# Patient Record
Sex: Female | Born: 1975 | Hispanic: Yes | Smoking: Never smoker
Health system: Southern US, Community
[De-identification: ages and names within clinical notes are randomized; demographics above are authoritative.]

## PROBLEM LIST (undated history)

## (undated) DIAGNOSIS — D259 Leiomyoma of uterus, unspecified: Secondary | ICD-10-CM

## (undated) DIAGNOSIS — Z5189 Encounter for other specified aftercare: Secondary | ICD-10-CM

## (undated) DIAGNOSIS — D649 Anemia, unspecified: Secondary | ICD-10-CM

## (undated) DIAGNOSIS — R519 Headache, unspecified: Secondary | ICD-10-CM

## (undated) DIAGNOSIS — R51 Headache: Secondary | ICD-10-CM

## (undated) DIAGNOSIS — N939 Abnormal uterine and vaginal bleeding, unspecified: Secondary | ICD-10-CM

## (undated) HISTORY — DX: Encounter for other specified aftercare: Z51.89

## (undated) HISTORY — PX: ABDOMINAL HYSTERECTOMY: SHX81

---

## 2001-03-30 ENCOUNTER — Ambulatory Visit (HOSPITAL_COMMUNITY): Admission: RE | Admit: 2001-03-30 | Discharge: 2001-03-30 | Payer: Self-pay | Admitting: *Deleted

## 2001-08-10 ENCOUNTER — Inpatient Hospital Stay (HOSPITAL_COMMUNITY): Admission: AD | Admit: 2001-08-10 | Discharge: 2001-08-10 | Payer: Self-pay | Admitting: Obstetrics & Gynecology

## 2001-08-31 ENCOUNTER — Encounter (HOSPITAL_COMMUNITY): Admission: RE | Admit: 2001-08-31 | Discharge: 2001-09-05 | Payer: Self-pay | Admitting: Obstetrics & Gynecology

## 2001-09-02 ENCOUNTER — Inpatient Hospital Stay (HOSPITAL_COMMUNITY): Admission: AD | Admit: 2001-09-02 | Discharge: 2001-09-04 | Payer: Self-pay | Admitting: *Deleted

## 2012-07-04 ENCOUNTER — Ambulatory Visit
Admission: RE | Admit: 2012-07-04 | Discharge: 2012-07-04 | Disposition: A | Discharge status: Home / Self Care | Payer: Self-pay | Source: Ambulatory Visit | Attending: Family Medicine | Admitting: Family Medicine

## 2012-07-04 ENCOUNTER — Other Ambulatory Visit: Payer: Self-pay | Admitting: Family Medicine

## 2012-07-04 ENCOUNTER — Ambulatory Visit: Payer: Self-pay | Admitting: Family Medicine

## 2012-07-04 VITALS — BP 132/84 | HR 72 | Temp 97.7°F | Resp 16 | Ht 61.5 in | Wt 186.0 lb

## 2012-07-04 DIAGNOSIS — N644 Mastodynia: Secondary | ICD-10-CM

## 2012-07-04 DIAGNOSIS — I10 Essential (primary) hypertension: Secondary | ICD-10-CM

## 2012-07-04 DIAGNOSIS — N63 Unspecified lump in unspecified breast: Secondary | ICD-10-CM

## 2012-07-04 DIAGNOSIS — J029 Acute pharyngitis, unspecified: Secondary | ICD-10-CM

## 2012-07-04 DIAGNOSIS — R590 Localized enlarged lymph nodes: Secondary | ICD-10-CM

## 2012-07-04 LAB — POCT URINE PREGNANCY: Preg Test, Ur: NEGATIVE

## 2012-07-04 LAB — POCT URINALYSIS DIPSTICK
Bilirubin, UA: NEGATIVE
Glucose, UA: NEGATIVE
Leukocytes, UA: NEGATIVE
Nitrite, UA: NEGATIVE
pH, UA: 6.5

## 2012-07-04 NOTE — Patient Instructions (Addendum)
1. Mass of breast  MM Digital Diagnostic Unilat L, MM Digital Diagnostic Bilat, US Breast Left  2. Breast pain  MM Digital Diagnostic Unilat L, POCT urinalysis dipstick, POCT urine pregnancy, MM Digital Diagnostic Bilat, US Breast Left  3. Hypertension  POCT urinalysis dipstick  4. Sore throat      YOU HAVE AN APPOINTMENT FOR A MAMMOGRAM AND BREAST ULTRASOUND TODAY AT 1:30 PM.    Driving directions to [facility] 3D2D  [phone]  - more info    [address]  [place], [place] [address]     1. Head south on [address] toward [address]      0.5 mi    2. Sharp left onto [address]      0.6 mi    3. Turn left onto the [employer] E ramp      0.2 mi    4. Merge onto [address] [address] E      3.0 mi    5. Continue straight to stay on [employer] [address] W E      0.4 mi    6. Slight left to stay on [employer] [address] W E      1.2 mi    7. Turn right onto [address]      0.1 mi    8. Turn left onto [employer]      361 ft    9. Take the 1st left onto [facility]  Destination will be on the right    Driving directions to [facility] 3D2D  [phone]  - more info    [address]  [place], [place] [address]     1. Head north on [address] toward [employer]      344 ft    2. Turn right onto [employer]      0.3 mi    3. Slight left to stay on [address]      1.7 mi    4. Turn left onto [employer]  Destination will be on the right     0.6 mi     [facility]  [address] [place]   Driving directions to [address] [place], [place], [place] [address] 3D2D  - more info    [address]  [place], [place] [address]     1. Head south on [address] toward [address]      0.5 mi    2. Sharp left onto [address]      0.6 mi    3. Turn left onto the [employer] E ramp      0.2 mi    4. Merge onto [employer] [address] E      3.0 mi    5. Continue straight to stay on [employer] W E      0.4 mi    6. Slight left to stay on [facility]  Destination  will be on the right     1.0 mi     [address] [place], [place] [address]   Driving directions to [facility] 3D2D  [phone]  - more info    [address]  [place], [place] [address]     1. Head south on [address] toward [address]      0.5 mi    2. Sharp left onto [address]  0.6 mi    3. Turn left onto the [employer] E ramp      0.2 mi    4. Merge onto [employer] [address] E      3.0 mi    5. Slight right toward [employer] (signs for [address])      0.2 mi    6. Take the ramp to [employer]      338 ft    7. Turn left onto [employer] [address]      0.3 mi    8. Turn left onto [address]  Destination will be on the right     0.2 mi     [facility]  [address]   Driving directions to [facility] 3D2D  - more info    [address]  [place], [place] [address]     1. Head south on [address] toward [address]      0.7 mi    2. Turn left onto [employer]      0.4 mi    3. Take the 3rd right onto [facility] W W      1.1 mi    4. Take the [address] ramp to [facility]      0.2 mi    5. Merge onto [address]      3.7 mi    6. Take exit [address] for [address] toward [place] [employer]      0.3 mi    7. Keep left at the fork, follow signs for Airport      381 ft    8. Keep left at the fork, follow signs for [facility] [address]      302 ft    9. Turn left onto [address]      2.6 mi    10. Turn right onto [employer] will be on the left     0.2 mi     [facility]

## 2012-07-04 NOTE — Progress Notes (Signed)
Subjective:    Patient ID: Amy Aguilar, female    DOB: 03/12/1976, 36 y.o.   MRN: [id number]  HPIThis 36 y.o. female presents for evaluation of the following:  1.  L breast pain with lump:  Pain started two days ago but noticed lump yesterday.  No breast feeding.  No pregnancy; LMP 2 weeks ago.  June 2013 went to gynecology; diagnosed with tumor and needs hysterectomy but has not scheduled.  +Five children; does not desire further child-bearing..  No breast cancer but mother died young at age 20; died of tuberculosis; pt has been tested for tuberculosis and negative.  Youngest child age 78yo twins. No contraception; +condoms.  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  Last sexual encounter one month ago.   2.  Mild sore throat:  No fever/+chills/no sweats.  No headache. Ear pain no; +decreased hearing L ear.  Mild rhinorrhea; mild nasal congestion.  No cough.  No n/v/d; no abdominal pain.  No sick.  No working.  3.  BP check:  History of HTN; was taking medication; one month ago, finished medication.  Prescribed by [facility].  Blood pressure at [facility].  Metoprolol previously prescribed.  Took medication for 8 months.  Has lost 8 pounds in past several months.  Started exercising regularly.  Denies chest pain, palpitations, shortness of breath, leg swelling, headaches, dizziness.   Review of Systems  Constitutional: Positive for chills. Negative for fever, diaphoresis and fatigue.  HENT: Positive for congestion and rhinorrhea. Negative for ear pain, sore throat, trouble swallowing, neck pain, neck stiffness, dental problem, voice change and sinus pressure.   Eyes: Negative for photophobia and visual disturbance.  Respiratory: Negative for cough, choking and shortness of breath.   Gastrointestinal: Negative for nausea, vomiting, abdominal pain and diarrhea.  Genitourinary:       +BREAST PAIN AND LUMP L.  Skin: Negative for rash.    Neurological: Negative for dizziness, syncope, weakness, light-headedness, numbness and headaches.  Hematological: Positive for adenopathy.    Past Medical History  Diagnosis Date  . Hypertension     No past surgical history on file.  Prior to Admission medications   Not on File    No Known Allergies  History   Social History  . Marital Status: Married    Spouse Name: N/A    Number of Children: N/A  . Years of Education: N/A   Occupational History  . Not on file.   Social History Main Topics  . Smoking status: Never Smoker   . Smokeless tobacco: Not on file  . Alcohol Use: No  . Drug Use: No  . Sexually Active: Yes   Other Topics Concern  . Not on file   Social History Narrative   Marital status: married x 17 years; happily married.  From [place]; moved to [place] 2000.Children: 5 children; no grandchildren. Living: husband, 5 children.Employment: homemaker.    Family History  Problem Relation Age of Onset  . Hypertension Father       Objective:   Physical Exam  Nursing note and vitals reviewed. Constitutional: She is oriented to person, place, and time. She appears well-developed and well-nourished. No distress.  HENT:  Head: Normocephalic and atraumatic.  Right Ear: External ear normal.  Left Ear: External ear normal.  Nose: Nose normal.  Mouth/Throat: Oropharynx is clear and moist and mucous membranes are normal. No oral lesions. No oropharyngeal exudate, posterior oropharyngeal edema, posterior oropharyngeal erythema or tonsillar abscesses.  Eyes: Conjunctivae are normal. Pupils are equal, round, and reactive to light.  Neck: Normal range of motion. Neck supple. No thyromegaly present.       Mild L anterior cervical LAD.  B hypertrophied tonsils without erythema, exudate.  No uvula deviation.  Cardiovascular: Normal rate, regular rhythm, normal heart sounds and intact distal pulses.   Pulmonary/Chest: Effort normal and breath sounds normal. No respiratory  distress.  Genitourinary: There is breast tenderness. No breast swelling, discharge or bleeding.       R BREAST: NORMAL EXAM. NO DISCHARGE FROM NIPPLE. L BREAST:  +3CM X 2 CM MASS MOBILE AT 11 O'CLOCK POSITION; +MILDY TTP WITHOUT ERYTHEMA ALONG SKIN; NO DRAINAGE EXPRESSED FROM NIPPLE.  NO AXILLARY LAD.  Lymphadenopathy:    She has cervical adenopathy.  Neurological: She is alert and oriented to person, place, and time. No cranial nerve deficit. She exhibits normal muscle tone.  Skin: Skin is warm and dry. No rash noted. She is not diaphoretic. No erythema.  Psychiatric: She has a normal mood and affect. Her behavior is normal. Judgment and thought content normal.     Results for orders placed in visit on 07/04/12  POCT URINALYSIS DIPSTICK      Component Value Range   Color, UA yellow     Clarity, UA clear     Glucose, UA neg     Bilirubin, UA neg     Ketones, UA neg     Spec Grav, UA 1.015     Blood, UA trace     pH, UA 6.5     Protein, UA neg     Urobilinogen, UA 0.2     Nitrite, UA neg     Leukocytes, UA Negative    POCT URINE PREGNANCY      Component Value Range   Preg Test, Ur Negative          Assessment & Plan:   1. Mass of breast  MM Digital Diagnostic Unilat L, MM Digital Diagnostic Bilat, US Breast Left  2. Breast pain  MM Digital Diagnostic Unilat L, POCT urinalysis dipstick, POCT urine pregnancy, MM Digital Diagnostic Bilat, US Breast Left  3. Hypertension  POCT urinalysis dipstick  4. Sore throat      1.  L breast pain/mass:  New.  Send for diagnostic L mammogram and L breast ultrasound.  Urine pregnancy negative.   2.  Hypertension: improved with exercise, weight loss; does not warrant medication at this time; encourage further weight loss and exercise. 3.  Cervical LAD: New. L sided; mild.  Supportive care with rest, Ibuprofen or Tylenol.  Contact # for patient:  [phone]

## 2012-07-05 NOTE — Progress Notes (Signed)
Reviewed and agree.

## 2012-07-07 ENCOUNTER — Other Ambulatory Visit: Payer: Self-pay | Admitting: Family Medicine

## 2012-07-07 DIAGNOSIS — N63 Unspecified lump in unspecified breast: Secondary | ICD-10-CM

## 2012-07-08 ENCOUNTER — Telehealth: Payer: Self-pay

## 2012-07-08 NOTE — Telephone Encounter (Signed)
Patient called requesting if lab results were available for biopsy done last week. Patient may need a translator in [place]. Thank you!

## 2012-07-08 NOTE — Telephone Encounter (Signed)
I'm not seeing that we have any results in EPIC but I didn't know if you remember getting a hard copy of it or if we need to call for it on Monday.

## 2012-07-08 NOTE — Telephone Encounter (Signed)
Patient wanted to know about lab results from last week about biopsy done. May need spanish translator. Thank you!

## 2012-07-09 NOTE — Telephone Encounter (Signed)
We recently advised patient of abnormal mammogram and breast u/s results; we recommended referral for biopsy.  I doubt that biopsy was completed unless it was coordinated by radiology facility where she had mammogram completed.  Can we please clarify with patient (will likely need Spanish speaking staff call patient) if she had biopsy performed yet or if she is still waiting on referral?  KMS

## 2012-07-10 NOTE — Telephone Encounter (Signed)
Called and spoke to patients husband and he is aware of

## 2012-07-10 NOTE — Telephone Encounter (Signed)
The biopsy scheduled for 07/12/2012 at 01:00 p.m.  have left message for them to call me back about this.

## 2012-07-10 NOTE — Telephone Encounter (Signed)
Dala Dock spoke w/pt in Spanish to find out what pt's ques was and make sure she knows about her biopsy appt on 9/18. Pt was aware of appt, but just wanted to know what was seen on [place] that is requiring her to go for biopsy. Dala Dock explained that they had just seen an area that did not look normal, but they have to do biopsy to determine what it is. Pt verbalized understanding to [place].

## 2012-07-10 NOTE — Telephone Encounter (Signed)
Husband advised he is aware of the appt, I am not sure what they need. I will call the other number or have someone who speaks spanish call.

## 2012-07-12 ENCOUNTER — Other Ambulatory Visit: Payer: Self-pay | Admitting: Family Medicine

## 2012-07-12 ENCOUNTER — Ambulatory Visit
Admission: RE | Admit: 2012-07-12 | Discharge: 2012-07-12 | Disposition: A | Discharge status: Home / Self Care | Payer: Self-pay | Source: Ambulatory Visit | Attending: Family Medicine | Admitting: Family Medicine

## 2012-07-12 DIAGNOSIS — N644 Mastodynia: Secondary | ICD-10-CM

## 2012-07-12 DIAGNOSIS — N63 Unspecified lump in unspecified breast: Secondary | ICD-10-CM

## 2012-07-20 ENCOUNTER — Other Ambulatory Visit: Payer: Self-pay | Admitting: Family Medicine

## 2012-07-20 DIAGNOSIS — N6009 Solitary cyst of unspecified breast: Secondary | ICD-10-CM

## 2012-07-21 ENCOUNTER — Ambulatory Visit (INDEPENDENT_AMBULATORY_CARE_PROVIDER_SITE_OTHER): Payer: Self-pay | Admitting: General Surgery

## 2012-08-08 ENCOUNTER — Ambulatory Visit: Payer: Self-pay | Admitting: Physician Assistant

## 2012-08-08 VITALS — BP 110/68 | HR 71 | Temp 98.0°F | Resp 16 | Ht 61.5 in | Wt 183.0 lb

## 2012-08-08 DIAGNOSIS — N926 Irregular menstruation, unspecified: Secondary | ICD-10-CM

## 2012-08-08 LAB — POCT CBC
Hemoglobin: 12.4 g/dL (ref 12.2–16.2)
MPV: 11 fL (ref 0–99.8)
POC Granulocyte: 3.6 (ref 2–6.9)
POC MID %: 4.4 %M (ref 0–12)
RBC: 4.7 M/uL (ref 4.04–5.48)

## 2012-08-08 LAB — POCT URINE PREGNANCY: Preg Test, Ur: NEGATIVE

## 2012-08-08 MED ORDER — NORGESTIMATE-ETH ESTRADIOL 0.25-35 MG-MCG PO TABS: 1.0000 | ORAL_TABLET | Freq: Every day | ORAL | Status: DC

## 2012-08-08 NOTE — Progress Notes (Signed)
[address], [place] [address]   Phone [phone]  Subjective:    Patient ID: Amy Aguilar, female    DOB: [birth year], 36 y.o.   MRN: [id number]  HPI Pt presents to clinic with menses since 9/27.  Heavy at times with clots.  She is only sexually active with her husband who works out of town, last sex was 1 month ago with condom.  She has had similar long menses in Dec and June.  In June she had a [place] and they told her she had fibroids that might be located in her cervix but she cannot remember exactly and does not have a copy of the results but they recommended a Hysterectomy and she wants someone elses opinion.  She has never had any STD testing done and is not concerned.  She is overall fatigued.  She has twin 35 y/o boyus.   Review of Systems  Constitutional: Positive for fatigue. Negative for fever and chills.  Genitourinary: Positive for vaginal bleeding and menstrual problem. Negative for dysuria and dyspareunia.       Objective:   Physical Exam  Vitals reviewed. Constitutional: She is oriented to person, place, and time. She appears well-developed and well-nourished.  HENT:  Head: Normocephalic and atraumatic.  Right Ear: External ear normal.  Left Ear: External ear normal.  Pulmonary/Chest: Effort normal.  Abdominal: Soft. Bowel sounds are normal. There is no tenderness.  Genitourinary: Uterus normal. Pelvic exam was performed with patient supine. There is no rash, tenderness, lesion or injury on the right labia. There is no rash, tenderness, lesion or injury on the left labia. Cervix exhibits discharge (blod from os) and friability. Cervix exhibits no motion tenderness. Right adnexum displays no mass, no tenderness and no fullness. Left adnexum displays no mass, no tenderness and no fullness. There is bleeding (blood and clots) around the vagina. No tenderness around the vagina.       Unable to feel fibroids.  Neurological: She is alert and oriented to  person, place, and time.  Skin: Skin is warm and dry.  Psychiatric: She has a normal mood and affect. Her behavior is normal. Judgment and thought content normal.    Results for orders placed in visit on 08/08/12  POCT URINE PREGNANCY      Component Value Range   Preg Test, Ur Negative    POCT CBC      Component Value Range   WBC 6.2  4.6 - 10.2 K/uL   Lymph, poc 2.3  0.6 - 3.4   POC LYMPH PERCENT 37.0  10 - 50 %L   MID (cbc) 0.3  0 - 0.9   POC MID % 4.4  0 - 12 %M   POC Granulocyte 3.6  2 - 6.9   Granulocyte percent 58.6  37 - 80 %G   RBC 4.70  4.04 - 5.48 M/uL   Hemoglobin 12.4  12.2 - 16.2 g/dL   HCT, POC 52.8  41.3 - 47.9 %   MCV 88.3  80 - 97 fL   MCH, POC 26.4 (*) 27 - 31.2 pg   MCHC 29.9 (*) 31.8 - 35.4 g/dL   RDW, POC 24.4     Platelet Count, POC 253  142 - 424 K/uL   MPV 11.0  0 - 99.8 fL         Assessment & Plan:   1. Menses, irregular  POCT urine pregnancy, GC/chlamydia probe amp, genital, POCT CBC, norgestimate-ethinyl estradiol (ORTHO-CYCLEN,SPRINTEC,PREVIFEM) 0.25-35 MG-MCG tablet  1- pt is going to try and get a copy of her [place] from June so we can f/u id necessary.  We discussed options for her irregular menses.  It is possible she has fibroids and they are causing her irregular menses even though I cannot palpate them today.  We are waiting on STD testing but think that is a low possibility to cause her irregular bleeding.  We will start OCP today and see her results over the next 6 months because she does not wish to get pregnant and she is not excited about HSYT option  The OCP should stop her current bleeding and control her menses in the future.  If she does not get relief from irregular menses then we will do a referral to Dr. Lily Peer for eval and repeat US.  D/w pt the SE of OCP and how to use them.

## 2012-08-09 LAB — GC/CHLAMYDIA PROBE AMP, GENITAL: Chlamydia, DNA Probe: NEGATIVE

## 2012-08-11 ENCOUNTER — Other Ambulatory Visit: Payer: Self-pay | Admitting: Family Medicine

## 2012-08-11 DIAGNOSIS — N63 Unspecified lump in unspecified breast: Secondary | ICD-10-CM

## 2012-08-30 ENCOUNTER — Inpatient Hospital Stay (HOSPITAL_COMMUNITY)
Admission: AD | Admit: 2012-08-30 | Discharge: 2012-08-31 | Disposition: A | Discharge status: Home / Self Care | Payer: Self-pay | Source: Ambulatory Visit | Attending: Obstetrics and Gynecology | Admitting: Obstetrics and Gynecology

## 2012-08-30 ENCOUNTER — Encounter (HOSPITAL_COMMUNITY): Payer: Self-pay | Admitting: *Deleted

## 2012-08-30 DIAGNOSIS — D259 Leiomyoma of uterus, unspecified: Secondary | ICD-10-CM | POA: Insufficient documentation

## 2012-08-30 DIAGNOSIS — N938 Other specified abnormal uterine and vaginal bleeding: Secondary | ICD-10-CM

## 2012-08-30 DIAGNOSIS — N949 Unspecified condition associated with female genital organs and menstrual cycle: Secondary | ICD-10-CM

## 2012-08-30 DIAGNOSIS — I10 Essential (primary) hypertension: Secondary | ICD-10-CM | POA: Insufficient documentation

## 2012-08-30 NOTE — MAU Provider Note (Signed)
CC: Vaginal Bleeding and Abdominal Pain    First Provider Initiated Contact with Patient 08/30/12 2325    Interpreter present for visit.  HPI Amy Aguilar ia a 36 y.o. [id number]  who presents with heavy vaginal bleeding since 10/27 that became heavier today, requiring pad change q 30 min. She is on Sprintec continuous for control of bleeding but missed today's pill. She has cramping and feels weak but not dizzy. She had regular menses until December 2012. She had long heavy bleeds in both December and June. She states that she had an ultrasound done in [place] and was told she had fibroids and they recommended a hysterectomy. She was seen at [facility] 08/08/2012 for an episode of bleeding for 9 days. At that time hemoglobin was 12.4. They did GC and Chlamydia which were both negative. She was started on Sprintec one a day which she has been taking until today.    Past Medical History  Diagnosis Date  . Hypertension     OB History    Grav Para Term Preterm Abortions TAB SAB Ect Mult Living   4 4 4      1 5      # Outc Date GA Lbr Len/2nd Wgt Sex Del Anes PTL Lv   1A TRM         Yes   1B          Yes   2 TRM         Yes   3 TRM         Yes   4 TRM         Yes      No past surgical history on file.  History   Social History  . Marital Status: Married    Spouse Name: N/A    Number of Children: N/A  . Years of Education: N/A   Occupational History  . Not on file.   Social History Main Topics  . Smoking status: Never Smoker   . Smokeless tobacco: Not on file  . Alcohol Use: No  . Drug Use: No  . Sexually Active: Yes   Other Topics Concern  . Not on file   Social History Narrative   Marital status: married x 17 years; happily married.  From [place]; moved to [place] 2000.Children: 5 children; no grandchildren. Living: husband, 5 children.Employment: homemaker.    No current facility-administered medications on file prior to encounter.   Current  Outpatient Prescriptions on File Prior to Encounter  Medication Sig Dispense Refill  . norgestimate-ethinyl estradiol (ORTHO-CYCLEN,SPRINTEC,PREVIFEM) 0.25-35 MG-MCG tablet Take 1 tablet by mouth daily.  1 Package  5    No Known Allergies  ROS Pertinent items in HPI  Filed Vitals:   08/30/12 2227  BP: 166/96  Pulse: 93  Temp: 98.2 F (36.8 C)  Resp: 18   PHYSICAL EXAM General: Well nourished, well developed female in no acute distress Cardiovascular: Normal rate Respiratory: Normal effort Abdomen: Soft, nontender Back: No CVAT Extremities: No edema Neurologic: Alert and oriented  Speculum exam: NEFG; vagina with large amount  Blood with clots; cervix clean Bimanual exam: cervix closed, no CMT; uterus NSSP; no adnexal tenderness or masses  LAB RESULTS Results for orders placed during the hospital encounter of 08/30/12 (from the past 24 hour(s))  CBC     Status: Abnormal   Collection Time   08/30/12 11:45 PM      Component Value Range   WBC 7.9  4.0 - 10.5  K/uL   RBC 3.78 (*) 3.87 - 5.11 MIL/uL   Hemoglobin 10.1 (*) 12.0 - 15.0 g/dL   HCT 16.1 (*) 09.6 - 04.5 %   MCV 83.6  78.0 - 100.0 fL   MCH 26.7  26.0 - 34.0 pg   MCHC 32.0  30.0 - 36.0 g/dL   RDW 40.9  81.1 - 91.4 %   Platelets 239  150 - 400 K/uL    IMAGING No results found.  MAU COURSE  ASSESSMENT 1. DUB (dysfunctional uterine bleeding)   Hx Fibroids Hypertension  PLAN D/W Dr. Emelda Fear Discharge home. See AVS for patient education F/U with [place] and GYN appt at [facility] Take iron supplement 1 po bid   Medication List     As of 08/31/2012  3:26 AM    STOP taking these medications         norgestimate-ethinyl estradiol 0.25-35 MG-MCG tablet   Commonly known as: ORTHO-CYCLEN,SPRINTEC,PREVIFEM      TAKE these medications         megestrol 40 MG tablet   Commonly known as: MEGACE   Take 1 tablet (40 mg total) by mouth daily.          Danae Orleans, CNM 08/30/2012 11:48 PM

## 2012-08-30 NOTE — MAU Note (Signed)
"  i have been bleeding a lot", states bleeding x 9 days, yesterday it became heavier. Today changing pad q 30  Minutes. Lower back , lower abd pain x 24 hours.  Had bleeding 9/30 thru 10/15, saw MD and started BCP's and this bleeding stopped but only for one week, then started bleeding again on 10/27

## 2012-08-31 LAB — CBC
HCT: 31.6 % — ABNORMAL LOW (ref 36.0–46.0)
Hemoglobin: 10.1 g/dL — ABNORMAL LOW (ref 12.0–15.0)
MCH: 26.7 pg (ref 26.0–34.0)
MCHC: 32 g/dL (ref 30.0–36.0)
MCV: 83.6 fL (ref 78.0–100.0)
Platelets: 239 10*3/uL (ref 150–400)
RBC: 3.78 MIL/uL — ABNORMAL LOW (ref 3.87–5.11)
RDW: 13.1 % (ref 11.5–15.5)
WBC: 7.9 10*3/uL (ref 4.0–10.5)

## 2012-08-31 MED ORDER — MEGESTROL ACETATE 40 MG PO TABS: 40.0000 mg | ORAL_TABLET | Freq: Every day | ORAL | Status: DC

## 2012-09-02 NOTE — MAU Provider Note (Signed)
Attestation of Attending Supervision of Advanced Practitioner: Evaluation and management procedures were performed by the PA/NP/CNM/OB Fellow under my supervision/collaboration. Chart reviewed and agree with management and plan.  Amy Aguilar V 09/02/2012 8:07 AM

## 2012-09-04 ENCOUNTER — Telehealth: Payer: Self-pay

## 2012-09-04 NOTE — Telephone Encounter (Signed)
Message copied by Faythe Casa on Mon Sep 04, 2012 12:59 PM ------      Message from: Malena Catholic      Created: Fri Sep 01, 2012 12:44 PM      Regarding: Needs [place] appt per [facility] appt has been made for Monday, November 25 at 2:15pm            Please schedule [place] and contact patient      ----- Message -----         From: Danae Orleans, CNM         Sent: 08/31/2012  12:47 AM           To: [facility] Admin Pool            F/U DUB. Givenn Megace. Please schedule pelvic US and appt.

## 2012-09-06 ENCOUNTER — Telehealth: Payer: Self-pay | Admitting: General Practice

## 2012-09-06 DIAGNOSIS — N938 Other specified abnormal uterine and vaginal bleeding: Secondary | ICD-10-CM

## 2012-09-06 NOTE — Telephone Encounter (Signed)
Message copied by Kathee Delton on Wed Sep 06, 2012 12:02 PM ------      Message from: Malena Catholic      Created: Fri Sep 01, 2012 12:44 PM      Regarding: Needs [place] appt per [facility] appt has been made for Monday, November 25 at 2:15pm            Please schedule [place] and contact patient      ----- Message -----         From: Danae Orleans, CNM         Sent: 08/31/2012  12:47 AM           To: [facility] Admin Pool            F/U DUB. Givenn Megace. Please schedule pelvic US and appt.

## 2012-09-06 NOTE — Telephone Encounter (Signed)
Called patient with Amy Aguilar for interpreter and notified patient of clinic appt on 11/25 at 2:15 and of her ultrasound appt on 11/19 at 3:15 and to come with a full bladder. Patient voiced understanding and had no further questions.

## 2012-09-12 ENCOUNTER — Ambulatory Visit (HOSPITAL_COMMUNITY)
Admission: RE | Admit: 2012-09-12 | Discharge: 2012-09-12 | Disposition: A | Discharge status: Home / Self Care | Payer: Self-pay | Source: Ambulatory Visit | Attending: Obstetrics & Gynecology | Admitting: Obstetrics & Gynecology

## 2012-09-12 DIAGNOSIS — N949 Unspecified condition associated with female genital organs and menstrual cycle: Secondary | ICD-10-CM | POA: Insufficient documentation

## 2012-09-12 DIAGNOSIS — N938 Other specified abnormal uterine and vaginal bleeding: Secondary | ICD-10-CM | POA: Insufficient documentation

## 2012-09-12 DIAGNOSIS — D251 Intramural leiomyoma of uterus: Secondary | ICD-10-CM | POA: Insufficient documentation

## 2012-09-18 ENCOUNTER — Encounter: Payer: Self-pay | Admitting: Obstetrics and Gynecology

## 2012-09-18 ENCOUNTER — Other Ambulatory Visit (HOSPITAL_COMMUNITY)
Admission: RE | Admit: 2012-09-18 | Discharge: 2012-09-18 | Disposition: A | Discharge status: Home / Self Care | Payer: Self-pay | Source: Ambulatory Visit | Attending: Obstetrics and Gynecology | Admitting: Obstetrics and Gynecology

## 2012-09-18 ENCOUNTER — Ambulatory Visit (INDEPENDENT_AMBULATORY_CARE_PROVIDER_SITE_OTHER): Payer: Self-pay | Admitting: Obstetrics and Gynecology

## 2012-09-18 VITALS — BP 122/81 | HR 91 | Temp 97.6°F | Ht 64.0 in | Wt 183.0 lb

## 2012-09-18 DIAGNOSIS — N920 Excessive and frequent menstruation with regular cycle: Secondary | ICD-10-CM | POA: Insufficient documentation

## 2012-09-18 DIAGNOSIS — Z01812 Encounter for preprocedural laboratory examination: Secondary | ICD-10-CM

## 2012-09-18 NOTE — Progress Notes (Signed)
  Subjective:    Patient ID: Amy Aguilar, female    DOB: 1976-09-06, 36 y.o.   MRN: [id number]  HPI  36 yo G4P4005 presenting today as an MAU follow-up for evaluation of abnormal uterine bleeding. Patient reports irregular and heavy bleeding since last December. Her periods can last up to 3 weeks. She was started on Spintec in September but without any effect. Patient was then prescribed Megace which has stopped her bleeding. Patient was seen by a gynecologist in [place] who informed her of fibroid uterus.  Past Medical History  Diagnosis Date  . Hypertension    History reviewed. No pertinent past surgical history. Family History  Problem Relation Age of Onset  . Hypertension Father     History  Substance Use Topics  . Smoking status: Never Smoker   . Smokeless tobacco: Never Used  . Alcohol Use: No     Review of Systems  All other systems reviewed and are negative.       Objective:   Physical Exam  GENERAL: Well-developed, well-nourished female in no acute distress.  HEENT: Normocephalic, atraumatic. Sclerae anicteric.  NECK: Supple. Normal thyroid.  LUNGS: Clear to auscultation bilaterally.  HEART: Regular rate and rhythm. ABDOMEN: Soft, nontender, nondistended. No organomegaly. PELVIC: Normal external female genitalia. Vagina is pink and rugated.  Normal discharge. Normal appearing cervix. Uterus is normal in size. No adnexal mass or tenderness. EXTREMITIES: No cyanosis, clubbing, or edema, 2+ distal pulses.   08/2012 ultrasound:  IMPRESSION:  1. Uterine fibroids, with largest in the lower uterine segment  measuring 3.2 cm.  2. Endometrial thickness measures 6 mm. If bleeding remains  unresponsive to hormonal or medical therapy, sonohysterogram should  be considered for focal lesion work-up. (Ref: Radiological  Reasoning: Algorithmic Workup of Abnormal Vaginal Bleeding with  Endovaginal Sonography and Sonohysterography. AJR 2008; 191:S68-  73).    3. Normal appearance of both ovaries. No adnexal mass identified.     Assessment & Plan:  35 yo G4P5 with dysfunctional uterine bleeding  - Discussed need for endometrial biopsy ENDOMETRIAL BIOPSY     The indications for endometrial biopsy were reviewed.   Risks of the biopsy including cramping, bleeding, infection, uterine perforation, inadequate specimen and need for additional procedures  were discussed. The patient states she understands and agrees to undergo procedure today. Consent was signed. Time out was performed. Urine HCG was negative. A sterile speculum was placed in the patient's vagina and the cervix was prepped with Betadine. A single-toothed tenaculum was placed on the anterior lip of the cervix to stabilize it. The uterine cavity was sounded to a depth of 8 cm using the uterine sound. The 3 mm pipelle was introduced into the endometrial cavity without difficulty, 2passes were made.  A  moderate amount of tissue was  sent to pathology. The instruments were removed from the patient's vagina. Minimal bleeding from the cervix was noted. The patient tolerated the procedure well.  Routine post-procedure instructions were given to the patient. The patient will follow up in two weeks to review the results and for further management.  - Discussed medical management with Mirena IUD which patient is interested in. - Informed patient that current prescription is not a birth control - RTC in 2 weeks for results and IUD insertion (application form provided)

## 2012-10-05 ENCOUNTER — Ambulatory Visit: Payer: Self-pay | Admitting: Obstetrics & Gynecology

## 2012-11-23 ENCOUNTER — Ambulatory Visit: Payer: Self-pay | Admitting: Obstetrics & Gynecology

## 2013-01-10 ENCOUNTER — Inpatient Hospital Stay: Admission: RE | Admit: 2013-01-10 | Payer: Self-pay | Source: Ambulatory Visit

## 2013-12-30 ENCOUNTER — Encounter (HOSPITAL_COMMUNITY): Payer: Self-pay | Admitting: Emergency Medicine

## 2013-12-30 ENCOUNTER — Emergency Department (INDEPENDENT_AMBULATORY_CARE_PROVIDER_SITE_OTHER)
Admission: EM | Admit: 2013-12-30 | Discharge: 2013-12-30 | Disposition: A | Discharge status: Home / Self Care | Payer: Self-pay | Source: Home / Self Care | Attending: Emergency Medicine | Admitting: Emergency Medicine

## 2013-12-30 DIAGNOSIS — G5600 Carpal tunnel syndrome, unspecified upper limb: Secondary | ICD-10-CM

## 2013-12-30 LAB — GLUCOSE, CAPILLARY: Glucose-Capillary: 90 mg/dL (ref 70–99)

## 2013-12-30 MED ORDER — PREDNISONE 10 MG PO TABS: ORAL_TABLET | ORAL | Status: DC

## 2013-12-30 MED ORDER — TRAMADOL HCL 50 MG PO TABS: 100.0000 mg | ORAL_TABLET | Freq: Three times a day (TID) | ORAL | Status: DC | PRN

## 2013-12-30 NOTE — ED Provider Notes (Signed)
Chief Complaint    Chief Complaint  Patient presents with  . Hand Pain    History of Present Illness     Amy Aguilar is a 38 year old female who has had a several month history of pain and numbness of both hands. This has been worse the last week and last night it hurt so much that she could not sleep. The pain involves both hands, right more so than left and radiates up the arm toward the shoulders. It is rated 4/10 in intensity, and hurts worse at nighttime. She notes numbness, tingling, like a shock like feelings, coldness, weakness of both hands. When the hands hurt she gets pain in her neck and some headache as well. She denies any history of diabetes, but does have some excessive thirst. No history of vitamin B12 deficiency.  Review of Systems     Other than as noted above, the patient denies any of the following symptoms: Systemic:  No fevers, chills, sweats, or muscle aches.  No weight loss.  Musculoskeletal:  No joint pain, arthritis, bursitis, swelling, back pain, or neck pain. Neurological:  No muscular weakness, paresthesias, headache, or trouble with speech or coordination.  No dizziness.  [place]    Past medical history, family history, social history, meds, and allergies were reviewed.    Physical Exam    Vital signs:  BP 133/94  Pulse 77  Temp(Src) 98.9 F (37.2 C) (Oral)  Resp 18  SpO2 99%  LMP 12/24/2013 Gen:  Alert and oriented times 3.  In no distress. Eyes: PERRLA, full EOMs. Neck: Full range of motion, no pain with movement, no adenopathy. Musculoskeletal: There was pain to palpation over both carpal tunnels, right more so than left. There was no swelling. No pain over the wrists dorsally, or over the MCP, PIP, DIP joints of the hand. Elbow and shoulder both have full range of motion. There was no swelling of the hands, pulses were full, Tinel's and Phalen's signs were negative. Muscle strength was normal, sensation was normal to light touch.   Otherwise, all joints had a full a ROM with no swelling, bruising or deformity.  No edema, pulses full. Extremities were warm and pink.  Capillary refill was brisk.  Skin:  Clear, warm and dry.  No rash. Neuro:  Alert and oriented times 3.  Muscle strength was normal.  Sensation was intact to light touch. Cranial nerves are intact. Finger to nose was normal, no pronator drift.  Labs   Results for orders placed during the hospital encounter of 12/30/13  GLUCOSE, CAPILLARY      Result Value Ref Range   Glucose-Capillary 90  70 - 99 mg/dL   Course in Urgent [place]   She was put in wrist splints bilaterally.  Assessment    The encounter diagnosis was Carpal tunnel syndrome.  Plan   1.  Meds:  The following meds were prescribed:   Discharge Medication List as of 12/30/2013  4:32 PM    START taking these medications   Details  predniSONE (DELTASONE) 10 MG tablet 3 daily for 10 days, 2 daily for 10 days, 1 daily for 10 days., Normal    traMADol (ULTRAM) 50 MG tablet Take 2 tablets (100 mg total) by mouth every 8 (eight) hours as needed., Starting 12/30/2013, Until Discontinued, Normal        2.  Patient Education/Counseling:  The patient was given appropriate handouts, self care instructions, and instructed in symptomatic relief, including rest and activity, elevation, application  of ice and compression.  She was given exercises to do.  3.  Follow up:  The patient was told to follow up here if no better in 3 to 4 days, or sooner if becoming worse in any way, and given some red flag symptoms such as worsening pain or new neurological symptoms which would prompt immediate return.  Follow up with her primary care physician in 2 weeks.     Harden Mo, MD 12/30/13 2046

## 2013-12-30 NOTE — Discharge Instructions (Signed)
Carpal tunnel syndrome is caused by compression of the median nerve in the wrist.  Most often, inflammation of the tendons that pass through the carpal tunnel and surround the median nerve is the causative factor. ° °Wear your wrist splint as much as possible, especially at night.   ° °The following exercises should be done twice daily: ° °Exercises for Carpal Tunnel Syndrome  °Wrists Exercise 1. °• Make a loose right fist, palm up, and use the left hand to press gently down against the clenched hand. °• Resist the force with the closed right hand for 5 seconds. Be sure to keep the wrist straight. °• Turn the right fist palm down, and press the knuckles against the left open palm for 5 seconds. °• Finally, turn the right palm so the thumb-side of the fist is up, and press down again for 5 seconds. °• Repeat with the left hand.  ° Exercise 2. °• Hold one hand straight up shoulder-high with fingers together and palm facing outward. (The position looks like a shoulder-high salute.) °• With the other hand, bend the hand being exercised backward with the fingers still held together and hold for 5 seconds. °• Spread the fingers and thumb open while the hand is still bent back and hold for 5 seconds. °• Repeat five times for each hand.  ° Exercise 3. (Wrist Circle) °• Hold the second and third fingers up, and close the others. °• Draw five clockwise circles in the air with the two finger tips. °• Draw five more counterclockwise circles. °• Repeat with the other hand.  °Fingers and Hand Exercise 1. °• Clench the fingers of one hand into a fist tightly. °• Release, fanning out the fingers. °• Do this five times. Repeat with the other hand.  ° Exercise 2. °• To exercise the thumb, bend it against the palm beneath the little finger, and hold for 5 seconds. °• Spread the fingers apart, palm up, and hold for 5 seconds. °• Repeat five to 10 times with each hand.  ° Exercise 3. °• Gently pull the thumb out and back and hold for 5  seconds. °• Repeat five to 10 times with each hand.  °Forearms (stretching these muscles will reduce tension in the wrist) • Place the hands together in front of the chest, fingers pointed upward in a prayer-like position. °• Keeping the palms flat together, raise the elbows to stretch the forearm muscles. °• Stretch for 10 seconds. °• Gently shake the hands limp for a few seconds to loosen them. °• Repeat frequently when the hands or arms tire from activity.  °Neck and Shoulders Exercise 1. °• Sit upright and place the right hand on top of the left shoulder. °• Hold that shoulder down, and slowly tip the head down toward the right. °• Keep the face pointed forward, or even turned slightly toward the right. °• Hold this stretch gently for 5 seconds. °• Repeat on the other side.  ° Exercise 2. °• Stand in a relaxed position with the arms at the side. °• Shrug the shoulders up, then squeeze the shoulders back, then stretch the shoulders down, and then press them forward. °• The entire exercise should take about 7 seconds.  ° ° °If you are employed in a job that requires repetitive hand or wrist movement (this includes keyboarding or use of a computer mouse) paying attention to work ergonomics is of utmost importance: ° °Preventing CTS in Keyboard Workers °Altering the way a person performs repetitive   activities may help prevent inflammation in the hand and wrist. Most of the interventions described below have been found to reduce repetitive motion problems in the muscles and tendons of the hand and arm. They may reduce the incidence of carpal tunnel syndrome, although there is no definite proof of this effect. °Replacing old tools with ergonomically designed new ones can be very helpful. °Rest Periods and Avoiding Repetition. Anyone who does repetitive tasks should begin with a short warm-up period, take frequent breaks, and avoid overexertion of the hand and finger muscles whenever possible. Employers should be urged  to vary the tasks and work content of their employees. °Taking multiple "microbreaks" (about 3 minutes each) reduces strain and discomfort without decreasing productivity. Such breaks may include the following: °• Shaking or stretching the limbs °• Leaning back in the chair °• Squeezing the shoulder blades together. °• Taking deep breaths °Good Posture. Good posture is extremely important in preventing carpal tunnel syndrome, particularly for typists and computer users. °• The worker should sit with the spine against the back of the chair with the shoulders relaxed. °• The elbows should rest along the sides of the body, with wrists straight. °• The feet should be firmly on the floor or on a footrest. °• Typing materials should be at eye level so that the neck does not bend over the work. °• Keeping the neck flexible and head upright maintains circulation and nerve function to the arms and hands. One method for finding the correct head position is the "pigeon" movement. Keeping the chin level, glide the head slowly and gently forward and backward in small movements, avoiding neck discomfort. °Good Office Furniture. Poorly designed office furniture is a major contributor to bad posture. Chairs should be adjustable for height, with a supportive backrest. Custom-designed chairs, made for people who do not fit in standard chairs, can be expensive. However, the costs are often offset by the savings in medical expenses that follow injuries related to bad posture. °Voice Recognition Software. For CTS patients who must use a computer frequently, a variety of voice recognition software packages (ViaVoice, Voice Xpress, Dragon NaturallySpeaking, IListen) are now available, enabling virtually hands-free computer use. °Keyboard and Mouse Tips. Anyone using a keyboard and mouse has some options that may help protect the hands. °• The tension of the keys should be adjusted so they can be depressed without excessive force. °• The  hands and wrists should remain in a relaxed position to avoid excessive force on the keyboard. °• A 2003 study suggested that mouse-use poses a higher risk than keyboard use. Replacing the mouse with a trackball device and the standard keyboard with a jointed-type keyboard are helpful substitutions. °• Wrist rests, which fit under most keyboards, can help keep the wrists and fingers in a comfortable position. °• Some people recommend keeping the computer mouse as close to the keyboard and the user's body as possible, to reduce shoulder muscle movement. °• The mouse should be held lightly, with the wrist and forearm relaxed. New mouse supports are also available that relieve stress on the hand and support the wrist. °• Some people cut their mouse pads in half to reduce movement. °Innovative keyboard designs may reduce hand stress: °• Alternative geometry keyboards (Microsoft Natural Keyboard, Apple Adjustable Keyboard) allow the user to adjust and modify hand positions as well as adjust key tension. Most have a split or "slanted" keyboard that places the wrists at an angle. Studies suggest they are useful in promoting a neutral position   for the wrist. °• The continuous passive motion (CPM) keyboard lifts and declines gently and automatically every 3 minutes to break tension on the hands and wrist. °• A keyless keyboard (orbiTouch) is an innovative device that uses two domes. The typist covers the domes with their hands and slides them into different positions that represent letters. °Reducing Force from Hand Tools °The force placed on the fingers, hands, and wrists by a repetitive task is an important contributor to CTS. To alleviate the effect of force on the wrist, tools and tasks should be designed so that the wrist position is the same as it would be if the arms dangled in a relaxed manner at the sides. °• No task should require the wrist to deviate from side to side or to remain flexed or highly extended for  long periods. °• The handles of hand tools such as screwdrivers, scrapers, paint brushes, and buffers should be designed so that the force of the worker's grip is distributed across the muscle between the base of the thumb and the little finger, not just in the center of the palm. °• People who need to hold tools (including pencils and steering wheels) for long periods of time should grip them as loosely as possible. °• In order to apply force appropriately, the ability to feel an object is extremely important. Tools with textured handles are helpful. °• If possible, people should avoid working at low temperatures, which reduces sensation in hands and fingers. °• Power tools and machines should be designed to minimize vibrations. °• Wearing thick gloves, when possible, may lessen the shock transmitted to the hands and wrists. ° ° ° °

## 2013-12-30 NOTE — ED Notes (Signed)
C/O bilat hand pain x 2 months with worsening over past week.  Unable to sleep last night due to pain.  R>L; radiates up to shoulders.  Took IBU without significant relief.  Has HA yesterday, now resolved.

## 2014-06-15 ENCOUNTER — Emergency Department (HOSPITAL_COMMUNITY)
Admission: EM | Admit: 2014-06-15 | Discharge: 2014-06-15 | Disposition: A | Discharge status: Home / Self Care | Payer: BC Managed Care – PPO | Source: Home / Self Care | Attending: Family Medicine | Admitting: Family Medicine

## 2014-06-15 ENCOUNTER — Encounter (HOSPITAL_COMMUNITY): Payer: Self-pay | Admitting: Emergency Medicine

## 2014-06-15 DIAGNOSIS — N390 Urinary tract infection, site not specified: Secondary | ICD-10-CM

## 2014-06-15 LAB — POCT URINALYSIS DIP (DEVICE)
Glucose, UA: NEGATIVE mg/dL
Ketones, ur: NEGATIVE mg/dL
NITRITE: POSITIVE — AB
PH: 7.5 (ref 5.0–8.0)
PROTEIN: 100 mg/dL — AB
Specific Gravity, Urine: 1.02 (ref 1.005–1.030)
Urobilinogen, UA: 1 mg/dL (ref 0.0–1.0)

## 2014-06-15 LAB — POCT PREGNANCY, URINE: Preg Test, Ur: NEGATIVE

## 2014-06-15 MED ORDER — CEPHALEXIN 500 MG PO CAPS: 500.0000 mg | ORAL_CAPSULE | Freq: Four times a day (QID) | ORAL | Status: DC

## 2014-06-15 NOTE — ED Provider Notes (Signed)
CSN: [id number]     Arrival date & time 06/15/14  1138 History   First MD Initiated Contact with Patient 06/15/14 1206     Chief Complaint  Patient presents with  . Urinary Tract Infection   (Consider location/radiation/quality/duration/timing/severity/associated sxs/prior Treatment) Patient is a 38 y.o. female presenting with dysuria. The history is provided by the patient.  Dysuria Pain quality:  Burning Pain severity:  Mild Duration:  2 days Progression:  Unchanged Chronicity:  New Recent urinary tract infections: yes (had similar in jan.)   Relieved by:  Phenazopyridine Urinary symptoms: frequent urination and hesitancy   Urinary symptoms: no hematuria   Associated symptoms: abdominal pain and nausea   Associated symptoms: no fever, no flank pain, no vaginal discharge and no vomiting     Past Medical History  Diagnosis Date  . Hypertension   . UTI (lower urinary tract infection)    History reviewed. No pertinent past surgical history. Family History  Problem Relation Age of Onset  . Hypertension Father    History  Substance Use Topics  . Smoking status: Never Smoker   . Smokeless tobacco: Never Used  . Alcohol Use: No   OB History   Grav Para Term Preterm Abortions TAB SAB Ect Mult Living   4 4 4      1 5      Review of Systems  Constitutional: Negative.  Negative for fever.  Gastrointestinal: Positive for nausea and abdominal pain. Negative for vomiting.  Genitourinary: Positive for dysuria, urgency and frequency. Negative for flank pain, vaginal discharge, menstrual problem and pelvic pain.    Allergies  Review of patient's allergies indicates no known allergies.  Home Medications   Prior to Admission medications   Medication Sig Start Date End Date Taking? Authorizing Provider  PHENAZOPYRIDINE HCL PO Take by mouth.   Yes Historical Provider, MD  cephALEXin (KEFLEX) 500 MG capsule Take 1 capsule (500 mg total) by mouth 4 (four) times daily. Take all of  medicine and drink lots of fluids 06/15/14   Billy Fischer, MD  megestrol (MEGACE) 40 MG tablet Take 1 tablet (40 mg total) by mouth daily. 08/31/12   Deirdre C Poe, CNM  predniSONE (DELTASONE) 10 MG tablet 3 daily for 10 days, 2 daily for 10 days, 1 daily for 10 days. 12/30/13   Harden Mo, MD  traMADol (ULTRAM) 50 MG tablet Take 2 tablets (100 mg total) by mouth every 8 (eight) hours as needed. 12/30/13   Harden Mo, MD   BP 124/78  Pulse 68  Temp(Src) 98.9 F (37.2 C) (Oral)  Resp 20  SpO2 97%  LMP 06/15/2014 Physical Exam  Nursing note and vitals reviewed. Constitutional: She is oriented to person, place, and time. She appears well-developed and well-nourished.  Abdominal: Soft. Bowel sounds are normal. She exhibits no distension and no mass. There is no tenderness. There is no rebound and no guarding.  Neurological: She is alert and oriented to person, place, and time.  Skin: Skin is warm and dry.    ED Course  Procedures (including critical care time) Labs Review Labs Reviewed  POCT URINALYSIS DIP (DEVICE) - Abnormal; Notable for the following:    Bilirubin Urine SMALL (*)    Hgb urine dipstick LARGE (*)    Protein, ur 100 (*)    Nitrite POSITIVE (*)    Leukocytes, UA MODERATE (*)    All other components within normal limits  POCT PREGNANCY, URINE    Imaging Review No results  found.   MDM   1. UTI (lower urinary tract infection)        Billy Fischer, MD 06/15/14 1245

## 2014-06-15 NOTE — Discharge Instructions (Signed)
Take all of medicine as directed, drink lots of fluids, see your doctor if further problems. °

## 2014-07-10 ENCOUNTER — Other Ambulatory Visit: Payer: Self-pay | Admitting: Gynecology

## 2014-08-26 ENCOUNTER — Encounter (HOSPITAL_COMMUNITY): Payer: Self-pay | Admitting: Emergency Medicine

## 2014-09-09 NOTE — H&P (Signed)
[facility]  DICTATION # [id number] CSN# [id number]   Margarette Asal, MD 09/09/2014 2:18 PM

## 2014-09-11 NOTE — H&P (Signed)
[name]  ACCOUNT NO.:  [id number]  MEDICAL RECORD NO.:  [id number]  LOCATION:                                 FACILITY:  PHYSICIAN:  Ralene Bathe. Matthew Saras, M.D.DATE OF BIRTH:  November 02, 1975  DATE OF ADMISSION:  09/12/2014 DATE OF DISCHARGE:  09/12/2014                             HISTORY & PHYSICAL   CHIEF COMPLAINT:  Symptomatic fibroids.  HISTORY OF PRESENT ILLNESS:  A 38 year old, G4, P4, delivered all of her children vaginally, currently using condoms for contraception.  This patient is a new patient of Dr. Roque Cash, she came in with complaints of heavier menstrual flow and pelvic pain.  He performed ultrasound in September 2015 that demonstrated 6.3 x 5.4 probable low uterine fibroid. Adnexa negative.  She also had endometrial biopsy around the same time that showed simple hyperplasia.  No other abnormalities.  Pap done at the same time was negative.  Due to the symptomatic bleeding, she would prefer to proceed with definitive hysterectomy.  We discussed LAVH with possibility of open surgery, but on exam her uterus was 8 week size and mobile.  Specific risks related to bleeding, infection, transfusion, adjacent organ injury, possible need to complete the surgery by open technique, wound infection, phlebitis along with her expected recovery time were discussed, which she understands and accepts.  PAST MEDICAL HISTORY:  Allergies none.  REVIEW OF SYSTEMS:  Significant for headache.  FAMILY HISTORY:  Otherwise negative.  SOCIAL HISTORY:  Denies alcohol, tobacco, or drug use.  She is married.  PHYSICAL EXAMINATION:  VITAL SIGNS:  Temperature 98.2, blood pressure 118/76. HEENT:  Unremarkable. NECK:  Supple without masses. LUNGS:  Clear. CARDIOVASCULAR:  Regular rate and rhythm without murmurs, rubs, or gallops. BREASTS:  Without masses. ABDOMEN:  Soft, flat, nontender. GU:  Vulva, vagina, cervix normal.  Uterus, 8 week size mobile.   Adnexa negative. EXTREMITIES:  Unremarkable. NEUROLOGIC:  Unremarkable.  IMPRESSION: 1. Simple hyperplasia. 2. Symptomatic leiomyoma.  PLAN:  LAVH.  Procedure and risks discussed as above.     Amy Aguilar M. Matthew Saras, M.D.     [facility]/MEDQ  D:  09/09/2014  T:  09/10/2014  Job:  [id number]

## 2014-09-12 ENCOUNTER — Encounter (HOSPITAL_BASED_OUTPATIENT_CLINIC_OR_DEPARTMENT_OTHER): Payer: Self-pay | Admitting: *Deleted

## 2014-09-16 ENCOUNTER — Encounter (HOSPITAL_BASED_OUTPATIENT_CLINIC_OR_DEPARTMENT_OTHER): Payer: Self-pay | Admitting: Anesthesiology

## 2014-09-16 NOTE — Anesthesia Preprocedure Evaluation (Deleted)
Anesthesia Evaluation  Patient identified by MRN, date of birth, ID band Patient awake    Reviewed: Allergy & Precautions, H&P , NPO status , Patient's Chart, lab work & pertinent test results  History of Anesthesia Complications Negative for: history of anesthetic complications  Airway Mallampati: II  TM Distance: >3 FB Neck ROM: Full    Dental no notable dental hx. (+) Dental Advisory Given   Pulmonary neg pulmonary ROS,  breath sounds clear to auscultation  Pulmonary exam normal       Cardiovascular Exercise Tolerance: Good hypertension, Rhythm:Regular Rate:Normal     Neuro/Psych negative neurological ROS  negative psych ROS   GI/Hepatic negative GI ROS, Neg liver ROS,   Endo/Other  negative endocrine ROS  Renal/GU negative Renal ROS  negative genitourinary   Musculoskeletal negative musculoskeletal ROS (+)   Abdominal   Peds negative pediatric ROS (+)  Hematology negative hematology ROS (+)   Anesthesia Other Findings   Reproductive/Obstetrics negative OB ROS                             Anesthesia Physical Anesthesia Plan  ASA: II  Anesthesia Plan: General   Post-op Pain Management:    Induction: Intravenous  Airway Management Planned: Oral ETT  Additional Equipment:   Intra-op Plan:   Post-operative Plan: Extubation in OR  Informed Consent: I have reviewed the patients History and Physical, chart, labs and discussed the procedure including the risks, benefits and alternatives for the proposed anesthesia with the patient or authorized representative who has indicated his/her understanding and acceptance.   Dental advisory given  Plan Discussed with: CRNA  Anesthesia Plan Comments:         Anesthesia Quick Evaluation

## 2014-09-17 ENCOUNTER — Ambulatory Visit (HOSPITAL_BASED_OUTPATIENT_CLINIC_OR_DEPARTMENT_OTHER)
Admission: RE | Admit: 2014-09-17 | Payer: BC Managed Care – PPO | Source: Ambulatory Visit | Admitting: Obstetrics and Gynecology

## 2014-09-17 ENCOUNTER — Encounter (HOSPITAL_BASED_OUTPATIENT_CLINIC_OR_DEPARTMENT_OTHER): Admission: RE | Payer: Self-pay | Source: Ambulatory Visit

## 2014-09-17 HISTORY — DX: Leiomyoma of uterus, unspecified: D25.9

## 2014-09-17 SURGERY — HYSTERECTOMY, VAGINAL, LAPAROSCOPY-ASSISTED
Anesthesia: General

## 2014-10-13 ENCOUNTER — Encounter (HOSPITAL_COMMUNITY): Payer: Self-pay | Admitting: *Deleted

## 2014-10-13 ENCOUNTER — Emergency Department (HOSPITAL_COMMUNITY): Payer: BC Managed Care – PPO

## 2014-10-13 ENCOUNTER — Emergency Department (HOSPITAL_COMMUNITY)
Admission: EM | Admit: 2014-10-13 | Discharge: 2014-10-14 | Disposition: A | Discharge status: Home / Self Care | Payer: BC Managed Care – PPO | Attending: Emergency Medicine | Admitting: Emergency Medicine

## 2014-10-13 DIAGNOSIS — N938 Other specified abnormal uterine and vaginal bleeding: Secondary | ICD-10-CM | POA: Diagnosis not present

## 2014-10-13 DIAGNOSIS — N939 Abnormal uterine and vaginal bleeding, unspecified: Secondary | ICD-10-CM

## 2014-10-13 DIAGNOSIS — R42 Dizziness and giddiness: Secondary | ICD-10-CM | POA: Diagnosis not present

## 2014-10-13 DIAGNOSIS — R110 Nausea: Secondary | ICD-10-CM | POA: Insufficient documentation

## 2014-10-13 HISTORY — DX: Leiomyoma of uterus, unspecified: D25.9

## 2014-10-13 LAB — TYPE AND SCREEN
ABO/RH(D): O POS
ANTIBODY SCREEN: NEGATIVE

## 2014-10-13 LAB — WET PREP, GENITAL
CLUE CELLS WET PREP: NONE SEEN
TRICH WET PREP: NONE SEEN
WBC WET PREP: NONE SEEN
YEAST WET PREP: NONE SEEN

## 2014-10-13 LAB — CBC WITH DIFFERENTIAL/PLATELET
BASOS ABS: 0 10*3/uL (ref 0.0–0.1)
Basophils Relative: 0 % (ref 0–1)
EOS PCT: 0 % (ref 0–5)
Eosinophils Absolute: 0 10*3/uL (ref 0.0–0.7)
HCT: 24.9 % — ABNORMAL LOW (ref 36.0–46.0)
Hemoglobin: 7.5 g/dL — ABNORMAL LOW (ref 12.0–15.0)
LYMPHS PCT: 31 % (ref 12–46)
Lymphs Abs: 2.5 10*3/uL (ref 0.7–4.0)
MCH: 22.9 pg — ABNORMAL LOW (ref 26.0–34.0)
MCHC: 30.1 g/dL (ref 30.0–36.0)
MCV: 75.9 fL — AB (ref 78.0–100.0)
MONO ABS: 0.3 10*3/uL (ref 0.1–1.0)
Monocytes Relative: 4 % (ref 3–12)
Neutro Abs: 5.3 10*3/uL (ref 1.7–7.7)
Neutrophils Relative %: 65 % (ref 43–77)
Platelets: 298 10*3/uL (ref 150–400)
RBC: 3.28 MIL/uL — ABNORMAL LOW (ref 3.87–5.11)
RDW: 13.5 % (ref 11.5–15.5)
WBC: 8.1 10*3/uL (ref 4.0–10.5)

## 2014-10-13 LAB — COMPREHENSIVE METABOLIC PANEL
ALT: 11 U/L (ref 0–35)
AST: 14 U/L (ref 0–37)
Albumin: 3.8 g/dL (ref 3.5–5.2)
Alkaline Phosphatase: 93 U/L (ref 39–117)
Anion gap: 11 (ref 5–15)
BILIRUBIN TOTAL: 0.6 mg/dL (ref 0.3–1.2)
BUN: 12 mg/dL (ref 6–23)
CHLORIDE: 104 meq/L (ref 96–112)
CO2: 26 meq/L (ref 19–32)
CREATININE: 0.54 mg/dL (ref 0.50–1.10)
Calcium: 8.5 mg/dL (ref 8.4–10.5)
GFR calc non Af Amer: >90 mL/min (ref >90)
Glucose, Bld: 126 mg/dL — ABNORMAL HIGH (ref 70–99)
Potassium: 3.3 mEq/L — ABNORMAL LOW (ref 3.7–5.3)
Sodium: 141 mEq/L (ref 137–147)
Total Protein: 7.1 g/dL (ref 6.0–8.3)

## 2014-10-13 LAB — ABO/RH: ABO/RH(D): O POS

## 2014-10-13 LAB — I-STAT BETA HCG BLOOD, ED (MC, WL, AP ONLY): I-stat hCG, quantitative: <5 m[IU]/mL (ref <5)

## 2014-10-13 MED ORDER — SODIUM CHLORIDE 0.9 % IV BOLUS (SEPSIS)
1000.0000 mL | Freq: Once | INTRAVENOUS | Status: AC
  Administered 2014-10-13: 1000 mL via INTRAVENOUS

## 2014-10-13 MED ORDER — MEDROXYPROGESTERONE ACETATE 10 MG PO TABS: 10.0000 mg | ORAL_TABLET | Freq: Every day | ORAL | Status: DC

## 2014-10-13 MED ORDER — IBUPROFEN 800 MG PO TABS: 800.0000 mg | ORAL_TABLET | Freq: Three times a day (TID) | ORAL | Status: DC

## 2014-10-13 NOTE — ED Provider Notes (Signed)
CSN: [id number]     Arrival date & time 10/13/14  1919 History   First MD Initiated Contact with Patient 10/13/14 2018     No chief complaint on file.    (Consider location/radiation/quality/duration/timing/severity/associated sxs/prior Treatment) HPI Comments: Patient is a [id number] 38 yo F PMHx significant for uterine fibroid presenting to the ED for three weeks of menstrual bleeding. She states today she developed heavier menstrual bleeding with clots. She endorses associated lower abdominal and back pain with intermittent episodes of lightheadedness and dizziness. Patient states she has used 14 menstrual pads today. She states her period are irregular, last menstrual cycle was 5 weeks ago. No abdominal surgical history. No history of blood transfusions. Dr. Matthew Saras is the patient's Ob/Gyn.   Past Medical History  Diagnosis Date  . Uterine fibroid    No past surgical history on file. No family history on file. History  Substance Use Topics  . Smoking status: Never Smoker   . Smokeless tobacco: Not on file  . Alcohol Use: Not on file   OB History    No data available     Review of Systems  Gastrointestinal: Positive for nausea and abdominal pain. Negative for vomiting and diarrhea.  Genitourinary: Positive for vaginal bleeding and pelvic pain. Negative for dysuria.  Neurological: Positive for dizziness and light-headedness. Negative for syncope.  All other systems reviewed and are negative.     Allergies  Review of patient's allergies indicates no known allergies.  Home Medications   Prior to Admission medications   Medication Sig Start Date End Date Taking? Authorizing Provider  ibuprofen (ADVIL,MOTRIN) 800 MG tablet Take 1 tablet (800 mg total) by mouth 3 (three) times daily. 10/13/14   Deysi Soldo L Envi Eagleson, PA-C  medroxyPROGESTERone (PROVERA) 10 MG tablet Take 1 tablet (10 mg total) by mouth daily. 10/13/14   Hugo Lybrand L Jaculin Rasmus, PA-C   BP 105/59 mmHg  Pulse 78   Temp(Src) 97.9 F (36.6 C) (Oral)  Resp 20  SpO2 100%  LMP 10/13/2014 Physical Exam  Constitutional: She is oriented to person, place, and time. She appears well-developed and well-nourished. No distress.  HENT:  Head: Normocephalic and atraumatic.  Right Ear: External ear normal.  Left Ear: External ear normal.  Nose: Nose normal.  Mouth/Throat: Oropharynx is clear and moist.  Eyes: Conjunctivae are normal.  Neck: Normal range of motion. Neck supple.  Cardiovascular: Normal rate, regular rhythm and normal heart sounds.   Pulmonary/Chest: Effort normal and breath sounds normal.  Abdominal: Soft. There is no tenderness.  Musculoskeletal: Normal range of motion.  Neurological: She is alert and oriented to person, place, and time.  Skin: Skin is warm and dry. She is not diaphoretic.  Psychiatric: She has a normal mood and affect.  Nursing note and vitals reviewed.  Exam performed by Baron Sane L,  exam chaperoned Date: 10/13/2014 Pelvic exam: normal external genitalia without evidence of trauma. VULVA: normal appearing vulva with no masses, tenderness or lesion. VAGINA: normal appearing vagina with normal color and discharge, no lesions. CERVIX: normal appearing cervix without lesions, cervical motion tenderness absent, cervical os closed with out purulent discharge; vaginal discharge - bloody with clots, Wet prep and DNA probe for chlamydia and GC obtained.   ADNEXA: normal adnexa in size, nontender and no masses UTERUS: uterus is normal size, shape, consistency and nontender.   ED Course  Procedures (including critical care time) Medications  sodium chloride 0.9 % bolus 1,000 mL (0 mLs Intravenous Stopped 10/13/14 2259)    Labs  Review Labs Reviewed  CBC WITH DIFFERENTIAL - Abnormal; Notable for the following:    RBC 3.28 (*)    Hemoglobin 7.5 (*)    HCT 24.9 (*)    MCV 75.9 (*)    MCH 22.9 (*)    All other components within normal limits  COMPREHENSIVE  METABOLIC PANEL - Abnormal; Notable for the following:    Potassium 3.3 (*)    Glucose, Bld 126 (*)    All other components within normal limits  WET PREP, GENITAL  GC/CHLAMYDIA PROBE AMP  I-STAT BETA HCG BLOOD, ED ([facility], [facility], [facility] ONLY)  TYPE AND SCREEN  ABO/RH    Imaging Review US Transvaginal Non-ob  10/13/2014   CLINICAL DATA:  Vaginal bleeding, menorrhagia, irregular menses. The patient reports a possible recent endometrial biopsy.  EXAM: TRANSABDOMINAL AND TRANSVAGINAL ULTRASOUND OF PELVIS  TECHNIQUE: Both transabdominal and transvaginal ultrasound examinations of the pelvis were performed. Transabdominal technique was performed for global imaging of the pelvis including uterus, ovaries, adnexal regions, and pelvic cul-de-sac. It was necessary to proceed with endovaginal exam following the transabdominal exam to visualize the endometrium and ovaries.  COMPARISON:  None  FINDINGS: Uterus  Measurements: 13.4 x 7.6 by 5.0 cm. There is fusiform enlargement of the lower uterine segment which is ill-defined and not discretely measurable.  Endometrium  Thickness: 11 mm.  No focal abnormality visualized.  Right ovary  Measurements: 3.5 x 1.8 x 1.8 cm. Normal appearance/no adnexal mass.  Left ovary  Measurements: 4.6 x 3.5 x 3.5 cm. Normal appearance/no adnexal mass.  Other findings  No free fluid.  IMPRESSION: Fusiform masslike enlargement of the lower uterine segment which could represent a fibroid although cervical neoplasia could appear similar. Correlation with Pap smear results is recommended, and pelvic MRI with contrast could be helpful for better anatomic evaluation of this possible fibroid and to determine possible treatment options.   Electronically Signed   By: Conchita Paris M.D.   On: 10/13/2014 23:32   US Pelvis Complete  10/13/2014   CLINICAL DATA:  Vaginal bleeding, menorrhagia, irregular menses. The patient reports a possible recent endometrial biopsy.  EXAM: TRANSABDOMINAL AND  TRANSVAGINAL ULTRASOUND OF PELVIS  TECHNIQUE: Both transabdominal and transvaginal ultrasound examinations of the pelvis were performed. Transabdominal technique was performed for global imaging of the pelvis including uterus, ovaries, adnexal regions, and pelvic cul-de-sac. It was necessary to proceed with endovaginal exam following the transabdominal exam to visualize the endometrium and ovaries.  COMPARISON:  None  FINDINGS: Uterus  Measurements: 13.4 x 7.6 by 5.0 cm. There is fusiform enlargement of the lower uterine segment which is ill-defined and not discretely measurable.  Endometrium  Thickness: 11 mm.  No focal abnormality visualized.  Right ovary  Measurements: 3.5 x 1.8 x 1.8 cm. Normal appearance/no adnexal mass.  Left ovary  Measurements: 4.6 x 3.5 x 3.5 cm. Normal appearance/no adnexal mass.  Other findings  No free fluid.  IMPRESSION: Fusiform masslike enlargement of the lower uterine segment which could represent a fibroid although cervical neoplasia could appear similar. Correlation with Pap smear results is recommended, and pelvic MRI with contrast could be helpful for better anatomic evaluation of this possible fibroid and to determine possible treatment options.   Electronically Signed   By: Conchita Paris M.D.   On: 10/13/2014 23:32     EKG Interpretation None      Discussed patient with Dr. Matthew Saras who recommends obtaining an Ultrasound for further evaluation of patient's vaginal bleeding.   MDM  Final diagnoses:  Vaginal bleeding  Dysfunctional uterine bleeding    Filed Vitals:   10/14/14 0000  BP:   Pulse: 78  Temp:   Resp:    Afebrile, NAD, non-toxic appearing, AAOx4. I have reviewed nursing notes, vital signs, and all appropriate lab and imaging results for this patient. Abdomen soft, non-tender, non-distended. Pelvic exam with closed cervical os. Blood in vaginal canal with clots.  [place] results reviewed with patient. Advised Ob/Gyn Follow up with Dr. Matthew Saras.  Provera prescribed for vaginal bleeding. Return precautions given. Patient is agreeable to plan. Patient is stable at time of discharge. Patient d/w with Dr. Eulis Foster, agrees with plan.        Harlow Mares, PA-C 10/14/14 5520  Richarda Blade, MD 10/14/14 1032

## 2014-10-13 NOTE — ED Notes (Signed)
Pt in c/o vaginal bleeding for the last three weeks, history of uterine fibroids, states today the bleeding has been heavier, reports lower back pain and lower abd pain, reports dizziness at times, this was worse today

## 2014-10-13 NOTE — ED Notes (Signed)
Orthostatics performed.  Vitals did not seem to indicate orthostasis but patient was symptomatic in the form of dizziness when standing.

## 2014-10-13 NOTE — Discharge Instructions (Signed)
Please call Dr. Matthew Saras in the morning to schedule a follow up appointment. Please take medications as prescribed. If symptoms change or worsen please go to the Emergency Room either here or at [facility] for re-evaluation. Please read all discharge instructions and return precautions.    Sangrado uterino anormal (Abnormal Uterine Bleeding) El sangrado uterino anormal puede afectar a las mujeres que estn en diversas etapas de la vida, desde adolescentes, mujeres frtiles y Games developer, hasta mujeres que han llegado a la menopausia. Hay diversas clases de sangrado uterino que se consideran anormales, entre ellas:  Prdidas de sangre o [employer] perodos.  Hemorragias luego de Retail banker.  Sangrado abundante o ms que lo habitual.  Perodos que duran ms que lo normal.  Sangrado luego de la menopausia. Muchos casos de sangrado uterino anormal son leves y simples de tratar, mientras que otros son ms graves. El mdico debe evaluar cualquier clase de sangrado anormal. El tratamiento depender de la causa del sangrado. INSTRUCCIONES PARA EL CUIDADO EN EL HOGAR Controle su afeccin para ver si hay cambios. Las siguientes indicaciones ayudarn a Chief Strategy Officer que pueda sentir:  Evite las duchas vaginales y el uso de tampones segn las indicaciones del mdico.  [place] compresas con frecuencia. Deber hacerse exmenes plvicos regulares y pruebas de Papanicolaou. Cumpla con todas las visitas de control y [employer] diagnsticos, segn le indique su mdico.  SOLICITE ATENCIN MDICA SI:   El sangrado dura ms de 1 semana.  Se siente mareada por momentos. SOLICITE ATENCIN MDICA DE INMEDIATO SI:   Se desmaya.  Debe cambiarse la compresa cada 15 a 30 minutos.  Siente dolor abdominal.  Jaclynn Guarneri.  Se siente dbil o presenta sudoracin.  Elimina cogulos grandes por la vagina.  Comienza a sentir nuseas y  [place]. ASEGRESE DE QUE:   Comprende estas instrucciones.  Controlar su afeccin.  Recibir ayuda de inmediato si no mejora o si empeora. Document Released: 10/11/2005 Document Revised: 10/16/2013 [facility] Patient Information 2015 [place]. This information is not intended to replace advice given to you by your health care provider. Make sure you discuss any questions you have with your health care provider.

## 2014-10-14 LAB — GC/CHLAMYDIA PROBE AMP
CT Probe RNA: NEGATIVE
GC PROBE AMP APTIMA: NEGATIVE

## 2014-10-17 ENCOUNTER — Emergency Department (HOSPITAL_COMMUNITY)
Admission: EM | Admit: 2014-10-17 | Discharge: 2014-10-17 | Disposition: A | Discharge status: Home / Self Care | Payer: BC Managed Care – PPO | Attending: Emergency Medicine | Admitting: Emergency Medicine

## 2014-10-17 ENCOUNTER — Emergency Department (HOSPITAL_COMMUNITY): Payer: BC Managed Care – PPO

## 2014-10-17 ENCOUNTER — Encounter (HOSPITAL_COMMUNITY): Payer: Self-pay | Admitting: Emergency Medicine

## 2014-10-17 DIAGNOSIS — R079 Chest pain, unspecified: Secondary | ICD-10-CM | POA: Diagnosis present

## 2014-10-17 DIAGNOSIS — I10 Essential (primary) hypertension: Secondary | ICD-10-CM | POA: Diagnosis not present

## 2014-10-17 DIAGNOSIS — D500 Iron deficiency anemia secondary to blood loss (chronic): Secondary | ICD-10-CM | POA: Insufficient documentation

## 2014-10-17 DIAGNOSIS — R0602 Shortness of breath: Secondary | ICD-10-CM | POA: Insufficient documentation

## 2014-10-17 DIAGNOSIS — R0789 Other chest pain: Secondary | ICD-10-CM | POA: Insufficient documentation

## 2014-10-17 DIAGNOSIS — Z793 Long term (current) use of hormonal contraceptives: Secondary | ICD-10-CM | POA: Insufficient documentation

## 2014-10-17 LAB — BASIC METABOLIC PANEL
ANION GAP: 8 (ref 5–15)
BUN: 9 mg/dL (ref 6–23)
CHLORIDE: 109 meq/L (ref 96–112)
CO2: 20 mmol/L (ref 19–32)
Calcium: 8.7 mg/dL (ref 8.4–10.5)
Creatinine, Ser: 0.6 mg/dL (ref 0.50–1.10)
GFR calc Af Amer: >90 mL/min (ref >90)
GFR calc non Af Amer: >90 mL/min (ref >90)
Glucose, Bld: 97 mg/dL (ref 70–99)
Potassium: 3.1 mmol/L — ABNORMAL LOW (ref 3.5–5.1)
Sodium: 137 mmol/L (ref 135–145)

## 2014-10-17 LAB — CBC
HCT: 25.3 % — ABNORMAL LOW (ref 36.0–46.0)
Hemoglobin: 7.7 g/dL — ABNORMAL LOW (ref 12.0–15.0)
MCH: 22.8 pg — ABNORMAL LOW (ref 26.0–34.0)
MCHC: 30.4 g/dL (ref 30.0–36.0)
MCV: 75.1 fL — ABNORMAL LOW (ref 78.0–100.0)
PLATELETS: 323 10*3/uL (ref 150–400)
RBC: 3.37 MIL/uL — ABNORMAL LOW (ref 3.87–5.11)
RDW: 14 % (ref 11.5–15.5)
WBC: 9.5 10*3/uL (ref 4.0–10.5)

## 2014-10-17 LAB — I-STAT TROPONIN, ED: Troponin i, poc: 0 ng/mL (ref 0.00–0.08)

## 2014-10-17 LAB — TROPONIN I: Troponin I: <0.03 ng/mL (ref <0.031)

## 2014-10-17 LAB — D-DIMER, QUANTITATIVE (NOT AT ARMC): D DIMER QUANT: 0.3 ug{FEU}/mL (ref 0.00–0.48)

## 2014-10-17 NOTE — ED Provider Notes (Signed)
CSN: [id number]     Arrival date & time 10/17/14  1946 History   First MD Initiated Contact with Patient 10/17/14 1959     Chief Complaint  Patient presents with  . Chest Pain     (Consider location/radiation/quality/duration/timing/severity/associated sxs/prior Treatment) Patient is a 38 y.o. female presenting with chest pain.  Chest Pain Pain location:  Substernal area Pain quality: pressure   Pain radiates to:  Upper back Pain radiates to the back: yes   Pain severity:  Moderate Onset quality:  Gradual Duration:  1 hour Timing:  Constant Progression:  Worsening Chronicity:  New Context: breathing   Relieved by:  Nothing Worsened by:  Deep breathing Ineffective treatments:  None tried Associated symptoms: shortness of breath (mild)   Associated symptoms: no abdominal pain, no back pain, no cough, no diaphoresis, no fever, no headache, no nausea, no numbness, no syncope, not vomiting and no weakness   Risk factors: birth control (recent provera shot for VB)   Risk factors: no coronary artery disease, no diabetes mellitus, no high cholesterol, no hypertension, no Marfan's syndrome, not pregnant, no prior DVT/PE and no smoking     Past Medical History  Diagnosis Date  . Hypertension   . Leiomyoma of uterus    History reviewed. No pertinent past surgical history. Family History  Problem Relation Age of Onset  . Hypertension Father    History  Substance Use Topics  . Smoking status: Never Smoker   . Smokeless tobacco: Never Used  . Alcohol Use: No   OB History    Gravida Para Term Preterm AB TAB SAB Ectopic Multiple Living   4 4 4      1 5      Review of Systems  Constitutional: Negative for fever and diaphoresis.  HENT: Negative for sore throat.   Eyes: Negative for visual disturbance.  Respiratory: Positive for shortness of breath (mild). Negative for cough.   Cardiovascular: Positive for chest pain. Negative for leg swelling and syncope.  Gastrointestinal:  Negative for nausea, vomiting and abdominal pain.  Genitourinary: Negative for difficulty urinating.  Musculoskeletal: Negative for back pain and neck pain.  Skin: Negative for rash.  Neurological: Negative for syncope, weakness, numbness and headaches.      Allergies  Review of patient's allergies indicates no known allergies.  Home Medications   Prior to Admission medications   Medication Sig Start Date End Date Taking? Authorizing Provider  ibuprofen (ADVIL,MOTRIN) 800 MG tablet Take 800 mg by mouth every 8 (eight) hours as needed for moderate pain.   Yes Historical Provider, MD  IRON PO Take 1 tablet by mouth daily.   Yes Historical Provider, MD  medroxyPROGESTERone (DEPO-PROVERA) 150 MG/ML injection Inject 150 mg into the muscle every 3 (three) months.   Yes Historical Provider, MD  traMADol (ULTRAM) 50 MG tablet Take 2 tablets (100 mg total) by mouth every 8 (eight) hours as needed. Patient not taking: Reported on 10/17/2014 12/30/13   Harden Mo, MD   BP 123/63 mmHg  Pulse 77  Resp 19  Ht 5\' 2"  (1.575 m)  SpO2 100%  LMP 10/13/2014 Physical Exam  Constitutional: She is oriented to person, place, and time. She appears well-developed and well-nourished. No distress.  HENT:  Head: Normocephalic and atraumatic.  Eyes: Conjunctivae and EOM are normal.  Neck: Normal range of motion.  Cardiovascular: Normal rate, regular rhythm, normal heart sounds and intact distal pulses.  Exam reveals no gallop and no friction rub.   No murmur  heard. Pulmonary/Chest: Effort normal and breath sounds normal. No respiratory distress. She has no wheezes. She has no rales.  Abdominal: Soft. She exhibits no distension. There is no tenderness. There is no guarding.  Musculoskeletal: She exhibits no edema or tenderness.  Neurological: She is alert and oriented to person, place, and time.  Skin: Skin is warm and dry. No rash noted. She is not diaphoretic. No erythema.  Nursing note and vitals  reviewed.   ED Course  Procedures (including critical care time) Labs Review Labs Reviewed  CBC - Abnormal; Notable for the following:    RBC 3.37 (*)    Hemoglobin 7.7 (*)    HCT 25.3 (*)    MCV 75.1 (*)    MCH 22.8 (*)    All other components within normal limits  BASIC METABOLIC PANEL - Abnormal; Notable for the following:    Potassium 3.1 (*)    All other components within normal limits  D-DIMER, QUANTITATIVE  I-STAT TROPOININ, ED    Imaging Review Dg Chest 2 View  10/17/2014   CLINICAL DATA:  Acute chest pain  EXAM: CHEST  2 VIEW  COMPARISON:  None.  FINDINGS: The heart size and mediastinal contours are within normal limits. Both lungs are clear. The visualized skeletal structures are unremarkable.  IMPRESSION: No active cardiopulmonary disease.   Electronically Signed   By: Daryll Brod M.D.   On: 10/17/2014 20:19     EKG Interpretation   Date/Time:  Thursday October 17 2014 19:52:51 EST Ventricular Rate:  90 PR Interval:  134 QRS Duration: 78 QT Interval:  366 QTC Calculation: 447 R Axis:   72 Text Interpretation:  Normal sinus rhythm Normal ECG No old tracing to  compare Confirmed by KNAPP  MD-J, JON [phone]) on 10/17/2014 8:39:40 PM     Results for Dawson Bills (MRN [id number]) as of 10/17/2014 22:23  Ref. Range 10/13/2014 19:53 10/13/2014 20:20 10/13/2014 20:30  Sodium Latest Range: 137-147 mEq/L 141    Potassium Latest Range: 3.7-5.3 mEq/L 3.3 (L)    Chloride Latest Range: 96-112 mEq/L 104    CO2 Latest Range: 19-32 mEq/L 26    BUN Latest Range: 6-23 mg/dL 12    Creatinine Latest Range: 0.50-1.10 mg/dL 0.54    Calcium Latest Range: 8.4-10.5 mg/dL 8.5    GFR calc non Af Amer Latest Range: >90 mL/min >90    GFR calc Af Amer Latest Range: >90 mL/min >90    Glucose Latest Range: 70-99 mg/dL 126 (H)    Anion gap Latest Range: 5-15  11    Alkaline Phosphatase Latest Range: 39-117 U/L 93    Albumin Latest Range: 3.5-5.2 g/dL 3.8    AST Latest Range:  0-37 U/L 14    ALT Latest Range: 0-35 U/L 11    Total Protein Latest Range: 6.0-8.3 g/dL 7.1    Total Bilirubin Latest Range: 0.3-1.2 mg/dL 0.6    WBC Latest Range: 4.0-10.5 K/uL 8.1    RBC Latest Range: 3.87-5.11 MIL/uL 3.28 (L)    Hemoglobin Latest Range: 12.0-15.0 g/dL 7.5 (L)    HCT Latest Range: 36.0-46.0 % 24.9 (L)    MCV Latest Range: 78.0-100.0 fL 75.9 (L)    MCH Latest Range: 26.0-34.0 pg 22.9 (L)    MCHC Latest Range: 30.0-36.0 g/dL 30.1    RDW Latest Range: 11.5-15.5 % 13.5    Platelets Latest Range: 150-400 K/uL 298    Neutrophils Relative % Latest Range: 43-77 % 65    Lymphocytes Relative Latest Range: 12-46 %  31    Monocytes Relative Latest Range: 3-12 % 4    Eosinophils Relative Latest Range: 0-5 % 0    Basophils Relative Latest Range: 0-1 % 0    NEUT# Latest Range: 1.7-7.7 K/uL 5.3    Lymphocytes Absolute Latest Range: 0.7-4.0 K/uL 2.5    Monocytes Absolute Latest Range: 0.1-1.0 K/uL 0.3    Eosinophils Absolute Latest Range: 0.0-0.7 K/uL 0.0    Basophils Absolute Latest Range: 0.0-0.1 K/uL 0.0    Sample Expiration No range found   10/16/2014  Antibody Screen No range found   NEG  ABO/RH(D) No range found  O POS O POS  Results for Dawson Bills (MRN [id number]) as of 10/18/2014 03:07  Ref. Range 10/13/2014 20:37  I-stat hCG, quantitative Latest Range: <5 mIU/mL <5.0   MDM   Final diagnoses:  Chest pain   38 year old female with a history of fibroids with recent evaluation for associated vaginal bleeding and anemia under w/u for hysterectomy presents with concern of chest pressure.  Differential diagnosis includes PE, pericarditis, ACS, angina, pneumothorax. An EKG was done and was evaluated by me showed a normal sinus rhythm without any acute ischemic ST changes.  Patient is low risk HEAR score and had negative delta troponins with low suspicion for ACS.  Doubt pericarditis/dissection by history.Given symptoms improved while in ED, doubt symptomatic anemia.  Of  note, patient registered as [place] at previous visit (see above) where she had neg preg test, hgb 7.5.  At that visit she was given provera, and reports vaginal bleeding has now stopped and today hgb improved to 7.7.  Given pt no longer with bleeding, taking iron,, expect hgb to continue to improve and do not feel transfusion is indicated at this time.  As pt recently had provera, considered PE however pt is low-moderate risk [place] and ddimer is negative.  Doubt PE given these and lack of tachypnea, tachycardia or hypoxia.  Discussed all results with pt in detail.  Provided number for PCP follow up and recommended continued close follow up with OBGYN.  Patient discharged in stable condition with understanding of reasons to return.   Alvino Chapel, MD 10/18/14 5361  Dorie Rank, MD 10/18/14 1630

## 2014-10-17 NOTE — ED Notes (Signed)
Pt reports mid sternal chest pressure that started 1 hour ago while at rest. Pain radiates into back and pt c/o difficulty breathing.

## 2014-11-21 ENCOUNTER — Encounter (HOSPITAL_COMMUNITY): Payer: Self-pay | Admitting: *Deleted

## 2014-11-21 ENCOUNTER — Observation Stay (HOSPITAL_COMMUNITY)
Admission: AD | Admit: 2014-11-21 | Discharge: 2014-11-22 | Disposition: A | Discharge status: Home / Self Care | Payer: BLUE CROSS/BLUE SHIELD | Source: Ambulatory Visit | Attending: Obstetrics and Gynecology | Admitting: Obstetrics and Gynecology

## 2014-11-21 DIAGNOSIS — D259 Leiomyoma of uterus, unspecified: Secondary | ICD-10-CM | POA: Diagnosis not present

## 2014-11-21 DIAGNOSIS — I10 Essential (primary) hypertension: Secondary | ICD-10-CM | POA: Insufficient documentation

## 2014-11-21 DIAGNOSIS — N938 Other specified abnormal uterine and vaginal bleeding: Secondary | ICD-10-CM | POA: Diagnosis not present

## 2014-11-21 DIAGNOSIS — D649 Anemia, unspecified: Secondary | ICD-10-CM

## 2014-11-21 LAB — ABO/RH: ABO/RH(D): O POS

## 2014-11-21 LAB — WET PREP, GENITAL
CLUE CELLS WET PREP: NONE SEEN
Trich, Wet Prep: NONE SEEN
Yeast Wet Prep HPF POC: NONE SEEN

## 2014-11-21 LAB — CBC
HEMATOCRIT: 21.6 % — AB (ref 36.0–46.0)
HEMATOCRIT: 26.8 % — AB (ref 36.0–46.0)
Hemoglobin: 6 g/dL — CL (ref 12.0–15.0)
Hemoglobin: 8.1 g/dL — ABNORMAL LOW (ref 12.0–15.0)
MCH: 19.8 pg — AB (ref 26.0–34.0)
MCH: 22.8 pg — AB (ref 26.0–34.0)
MCHC: 27.8 g/dL — ABNORMAL LOW (ref 30.0–36.0)
MCHC: 30.2 g/dL (ref 30.0–36.0)
MCV: 71.3 fL — AB (ref 78.0–100.0)
MCV: 75.3 fL — ABNORMAL LOW (ref 78.0–100.0)
Platelets: 292 10*3/uL (ref 150–400)
Platelets: 270 10*3/uL (ref 150–400)
RBC: 3.03 MIL/uL — ABNORMAL LOW (ref 3.87–5.11)
RBC: 3.56 MIL/uL — AB (ref 3.87–5.11)
RDW: 16.3 % — ABNORMAL HIGH (ref 11.5–15.5)
RDW: 17.3 % — ABNORMAL HIGH (ref 11.5–15.5)
WBC: 7.9 10*3/uL (ref 4.0–10.5)
WBC: 7 10*3/uL (ref 4.0–10.5)

## 2014-11-21 LAB — PREPARE RBC (CROSSMATCH)

## 2014-11-21 MED ORDER — SODIUM CHLORIDE 0.9 % IV BOLUS (SEPSIS): 500.0000 mL | Freq: Once | INTRAVENOUS | Status: DC

## 2014-11-21 MED ORDER — SODIUM CHLORIDE 0.9 % IV SOLN
Freq: Once | INTRAVENOUS | Status: AC
  Administered 2014-11-21: 10:00:00 via INTRAVENOUS

## 2014-11-21 MED ORDER — ACETAMINOPHEN 325 MG PO TABS
650.0000 mg | ORAL_TABLET | Freq: Once | ORAL | Status: AC
  Administered 2014-11-21: 650 mg via ORAL
  Filled 2014-11-21: qty 2

## 2014-11-21 MED ORDER — DIPHENHYDRAMINE HCL 25 MG PO CAPS
25.0000 mg | ORAL_CAPSULE | Freq: Once | ORAL | Status: AC
  Administered 2014-11-21: 25 mg via ORAL
  Filled 2014-11-21: qty 1

## 2014-11-21 MED ORDER — SODIUM CHLORIDE 0.9 % IV SOLN
INTRAVENOUS | Status: DC
  Administered 2014-11-21 – 2014-11-22 (×2): via INTRAVENOUS

## 2014-11-21 MED ORDER — OXYCODONE-ACETAMINOPHEN 5-325 MG PO TABS
1.0000 | ORAL_TABLET | Freq: Four times a day (QID) | ORAL | Status: DC | PRN
Start: 2014-11-21 — End: 2014-11-22
  Administered 2014-11-21: 1 via ORAL
  Filled 2014-11-21: qty 1

## 2014-11-21 MED ORDER — SODIUM CHLORIDE 0.9 % IV SOLN: Freq: Once | INTRAVENOUS | Status: DC

## 2014-11-21 NOTE — MAU Provider Note (Signed)
History     CSN: [id number]  Arrival date and time: 11/21/14 9833   First Provider Initiated Contact with Patient 11/21/14 0406      Chief Complaint  Patient presents with  . Vaginal Bleeding   HPI Comments: Amy Aguilar 39 y.o. [id number] presents to MAU with ongoing vaginal bleeding that now has an odor and feeling weak, nauseated and dizzy at work. She is scheduled for a hysterectomy on 12/30/14 by Dr Matthew Saras for symptomatic fibroids. She has been bleeding since October and is currently on Depo Provera. Her last dose was in December. Her last Hgb was 7.7 on 12/25.   Vaginal Bleeding Associated symptoms include nausea.      Past Medical History  Diagnosis Date  . Hypertension   . Leiomyoma of uterus     History reviewed. No pertinent past surgical history.  Family History  Problem Relation Age of Onset  . Hypertension Father     History  Substance Use Topics  . Smoking status: Never Smoker   . Smokeless tobacco: Never Used  . Alcohol Use: No    Allergies: No Known Allergies  Prescriptions prior to admission  Medication Sig Dispense Refill Last Dose  . ibuprofen (ADVIL,MOTRIN) 800 MG tablet Take 800 mg by mouth every 8 (eight) hours as needed for moderate pain.   10/16/2014 at Unknown time  . IRON PO Take 1 tablet by mouth daily.   10/17/2014 at Unknown time  . medroxyPROGESTERone (DEPO-PROVERA) 150 MG/ML injection Inject 150 mg into the muscle every 3 (three) months.   [phone]  . traMADol (ULTRAM) 50 MG tablet Take 2 tablets (100 mg total) by mouth every 8 (eight) hours as needed. (Patient not taking: Reported on 10/17/2014) 30 tablet 0 Not Taking at Unknown time    Review of Systems  Constitutional: Positive for malaise/fatigue.  HENT: Negative.   Respiratory: Negative.   Gastrointestinal: Positive for nausea.  Genitourinary: Positive for vaginal bleeding.       Excessive vaginal bleeding  Musculoskeletal: Negative.   Neurological: Positive for  dizziness and weakness.  Psychiatric/Behavioral: Negative.    Physical Exam   Blood pressure 119/73, pulse 86, temperature 98.3 F (36.8 C), resp. rate 18.  Physical Exam  Constitutional: She is oriented to person, place, and time. She appears well-developed and well-nourished. No distress.  Spanish Interpreter present   HENT:  Head: Normocephalic and atraumatic.  Eyes: Pupils are equal, round, and reactive to light.  Cardiovascular: Normal rate, regular rhythm and normal heart sounds.   Respiratory: Effort normal and breath sounds normal. No respiratory distress. She has no wheezes.  GI: Soft. She exhibits no distension. There is no tenderness.  Genitourinary:  Genital: External bloody Vaginal: moderate amount blood Cervix:closed/ thick Bimanual: uterus firm/ fibroid   Musculoskeletal: Normal range of motion.  Neurological: She is alert and oriented to person, place, and time.  Skin: Skin is warm and dry.  Psychiatric: She has a normal mood and affect. Her behavior is normal. Judgment and thought content normal.   Results for orders placed or performed during the hospital encounter of 11/21/14 (from the past 24 hour(s))  CBC     Status: Abnormal   Collection Time: 11/21/14  3:45 AM  Result Value Ref Range   WBC 7.9 4.0 - 10.5 K/uL   RBC 3.03 (L) 3.87 - 5.11 MIL/uL   Hemoglobin 6.0 (LL) 12.0 - 15.0 g/dL   HCT 21.6 (L) 36.0 - 46.0 %   MCV 71.3 (L) 78.0 - 100.0 fL  MCH 19.8 (L) 26.0 - 34.0 pg   MCHC 27.8 (L) 30.0 - 36.0 g/dL   RDW 16.3 (H) 11.5 - 15.5 %   Platelets 292 150 - 400 K/uL   .Marland Kitchen  MAU Course  Procedures  MDM  Start IV NS Spoke with Dr Gaetano Net who will give orders to admit   Assessment and Plan   A: Symptomatic anemia due to blood loss  P: Admit per Dr Ubaldo Glassing, Milas Kocher 11/21/2014, 4:32 AM

## 2014-11-21 NOTE — H&P (Signed)
Sula Soda  DICTATION # [id number] CSN# [id number]   Margarette Asal, MD 11/21/2014 11:02 AM

## 2014-11-21 NOTE — H&P (Signed)
[name]  ACCOUNT NO.:  [id number]  MEDICAL RECORD NO.:  [id number]  LOCATION:                                 FACILITY:  PHYSICIAN:  Ralene Bathe. Matthew Saras, M.D.    DATE OF BIRTH:  DATE OF ADMISSION:  11/21/2014 DATE OF DISCHARGE:                             HISTORY & PHYSICAL   CHIEF COMPLAINT:  Menorrhagia/leiomyoma.  HISTORY OF PRESENT ILLNESS:  A 39 year old, G4, P4, currently using condoms.  This patient had been referred to Dr. Carren Rang through the [facility] ED for menorrhagia with anemia.  He evaluated her in the office with ultrasound endometrial biopsy, that showed a 5.4 cm lower uterine fibroid, a simple cyst on the right, otherwise unremarkable.  She was actually scheduled for hysterectomy, but postponed it due to insurance issues, and was known to be anemic.  She has been on outpatient iron and received Depo-Provera to try to decrease her bleeding, and leading up to the time of her hysterectomy, which was scheduled for March.  She presents to MAU today with increased bleeding, hemoglobin of 6, with some orthostatic complaints.  She was admitted for evaluation, hydration, and transfusion.  In the interim, we will go ahead and push her hysterectomy up to within the next several weeks, the scheduled time is still pending, but she is agreeable to that.  Specific risks and benefits related to transfusion, discussed with her and she signed the consent.  ALLERGIES:  None.  REVIEW OF SYSTEMS:  Otherwise, unremarkable.  She has had 4 vaginal deliveries.  FAMILY HISTORY:  Otherwise, negative.  PHYSICAL EXAMINATION:  VITAL SIGNS:  Temp 98.2, blood pressure 120/78.  HEENT:  Unremarkable.  NECK:  Supple without masses.  LUNGS:  Clear.  CARDIOVASCULAR:  Regular rate and rhythm without murmurs, rubs, or gallops.  BREASTS:  Without masses.  ABDOMEN:  Soft, flat, and nontender.  PELVIC:  Vulva, vagina, and cervix normal.  Uterus is 8-10 week  size, mobile.  Adnexa unremarkable.  EXTREMITIES:  Unremarkable.  NEUROLOGIC:  Unremarkable.  Pap smear dated September 15, was normal.  Her endometrial biopsy showed simple hyperplasia, no atypia.  IMPRESSION:  Symptomatic leiomyoma, menorrhagia with anemia.  PLAN:  I will transfuse 2 units at this time.  Continue her iron, may be able to be discharged tomorrow, we will reschedule hysterectomy from March to early February, my office is working on that rescheduling now.     Donnel Venuto M. Matthew Saras, M.D.     RMH/MEDQ  D:  11/21/2014  T:  11/21/2014  Job:  [id number]

## 2014-11-21 NOTE — Progress Notes (Signed)
Discussed need for transfusion, proced + risks discussed>>will transfuse 2 u PRBC's, will get my office to attempt to sched hyst sooner

## 2014-11-21 NOTE — Progress Notes (Signed)
I assisted Debbie,RN during consent signing for blood transfusion.  Clifford  Interpreter.

## 2014-11-21 NOTE — Progress Notes (Signed)
I stopped by to check on patient's needs. [place]  Interpreter.

## 2014-11-21 NOTE — MAU Note (Signed)
Pt states she  Has been having a lot of bleeding since 56months 2 times in the ER and pt states she got injection and bleeding stopped for 1 week . Pt states she works third shift and feels very dizzy and also nauseous

## 2014-11-21 NOTE — Progress Notes (Signed)
After she got transfer to this Unit I stopped by and order her breakfast, by Juliann Mule Spanish -Interpreter

## 2014-11-21 NOTE — MAU Note (Signed)
PT  SAYS  SHE HAS HYSTERECTOMY   [facility]  FOR 12-30-2014   WITH   DR HOLLAND  .    VAG BLEEDING  HAS BEEN  SINCE  OCT .    TONIGHT   SHE WAS AT WORK  AT  [employer]   AND   SHE  FELT   WEAK  AND  DIZZY .   SAYS  VAG BLEEDING  AMT  IS SAME   AS USUAL.  BUT  BLOOD  HAS AN ODOR.       ON ASSESSMENT -   SMALL  AMT  LIGHT  REDD  ON PAD.

## 2014-11-21 NOTE — Progress Notes (Signed)
I ordered patient's lunch.  [place]  Interpreter.

## 2014-11-21 NOTE — Progress Notes (Addendum)
I assisted Environmental consultant with some questions.by Juliann Mule Spanish -Interpreter

## 2014-11-22 LAB — TYPE AND SCREEN
ABO/RH(D): O POS
Antibody Screen: NEGATIVE
UNIT DIVISION: 0
Unit division: 0

## 2014-11-22 NOTE — Progress Notes (Signed)
Pt discharged home with daughter... Discharge instructions reviewed with patient with Eda, Interpreter, present at bedside... Condition stable... No equipment... Ambulated to car with Janalyn Shy, RN.

## 2014-11-22 NOTE — Discharge Summary (Signed)
Physician Discharge Summary  Patient ID: Amy Aguilar MRN: [id number] DOB/AGE: [birth year] 38 y.o.  Admit date: 11/21/2014 Discharge date: 11/22/2014  Admission Diagnoses:anemia, menorrhagia, leiomyoma  Discharge Diagnoses: same Active Problems:   Leiomyoma of uterus   Anemia   Discharged Condition: good  Hospital Course: adm with cont probs related to fibroids and menorrhagia.  Hgb 6.1 on adm, although it had been in this range as OPT weeks before, now with some orthostatic sx  rec'd 2 u PRBC with improvement in sx and Hgb 8, we were able to move hyst up to 2 weeks from now instead of March  Consults: None  Significant Diagnostic Studies: labs:  CBC    Component Value Date/Time   WBC 7.0 11/21/2014 2028   WBC 6.2 08/08/2012 1153   RBC 3.56* 11/21/2014 2028   RBC 4.70 08/08/2012 1153   HGB 8.1* 11/21/2014 2028   HGB 12.4 08/08/2012 1153   HCT 26.8* 11/21/2014 2028   HCT 41.5 08/08/2012 1153   PLT 270 11/21/2014 2028   MCV 75.3* 11/21/2014 2028   MCV 88.3 08/08/2012 1153   MCH 22.8* 11/21/2014 2028   MCH 26.4* 08/08/2012 1153   MCHC 30.2 11/21/2014 2028   MCHC 29.9* 08/08/2012 1153   RDW 17.3* 11/21/2014 2028      Treatments: transfusion  Discharge Exam: Blood pressure 92/51, pulse 82, temperature 99.3 F (37.4 C), temperature source Oral, resp. rate 16, height 5\' 3"  (1.6 m), weight 173 lb (78.472 kg), last menstrual period 11/21/2014, SpO2 99 %. resolution of orthostatic sx, abd soft + BS  Disposition: 01-Home or Self Care     Medication List    STOP taking these medications        medroxyPROGESTERone 150 MG/ML injection  Commonly known as:  DEPO-PROVERA      TAKE these medications        ibuprofen 800 MG tablet  Commonly known as:  ADVIL,MOTRIN  Take 800 mg by mouth every 8 (eight) hours as needed for moderate pain.     IRON PO  Take 1 tablet by mouth daily.     traMADol 50 MG tablet  Commonly known as:  ULTRAM  Take 2 tablets  (100 mg total) by mouth every 8 (eight) hours as needed.           Follow-up Information    Follow up with Margarette Asal, MD. Schedule an appointment as soon as possible for a visit in 1 week.   Specialty:  Obstetrics and Gynecology   Contact information:   [place] [address] [phone]       Signed: Margarette Asal 11/22/2014, 9:20 AM

## 2014-11-22 NOTE — Progress Notes (Signed)
I stopped by patients room to check her needs, by Juliann Mule Spanish Interpreter

## 2014-11-26 NOTE — H&P (Signed)
Sula Soda  DICTATION # [id number] CSN# [id number]   Margarette Asal, MD 11/26/2014 9:58 AM

## 2014-11-27 NOTE — H&P (Signed)
[name]  ACCOUNT NO.:  [id number]  MEDICAL RECORD NO.:  [id number]  LOCATION:                                 FACILITY:  PHYSICIAN:  Amy Aguilar, M.D.DATE OF BIRTH:  09/13/1976  DATE OF ADMISSION:  12/05/2014 DATE OF DISCHARGE:                             HISTORY & PHYSICAL   CHIEF COMPLAINT:  Menorrhagia with symptomatic anemia, leiomyoma.  HPI:  A 39 year old, G4, P , currently using condoms.  Patient has seen Dr. Wayland Salinas originally through the [facility] ED for complaints of menorrhagia with anemia.  His evaluation in the office, endometrial biopsy that showed simple hyperplasia, a 5.4 cm lower uterine fibroid, a simple cyst on the right, otherwise unremarkable.  In November, she was actually scheduled for hysterectomy which she rescheduled that is continued to have problems with bleeding.  She did receive Depo-Provera 150 IM as an outpatient to try to decrease the bleeding until we could get her surgery scheduled.  On more recently, 11/21/2014, presented to MAU with symptomatic orthostatics changes, hemoglobin 6 which actually had been as an outpatient.  She was admitted, received 2 unit blood transfusion with an improvement over hemoglobin up to 8.  Whereas before, she was previously scheduled for hysterectomy in March.  We were able to move this up to mid February which she presents for at this time.  The procedure of LAVH including specific risks related to bleeding, infection, transfusion, adjacent organ injury, wound infection, phlebitis along with her expected recovery time, the need to complete the surgery by open technique.  All reviewed with her, which she understands and accepts.  PAST MEDICAL HISTORY:  Allergies none.  REVIEW OF SYSTEMS:  Unremarkable.  She has had 4 prior vaginal deliveries.  FAMILY HISTORY:  Otherwise negative.  PHYSICAL EXAM:  VITAL SIGNS:  Temp 98.2 blood pressure 120/82. HEENT:  Unremarkable. NECK:  Supple  without masses. LUNGS:  Clear. CARDIOVASCULAR:  Regular rate and rhythm without murmurs, rubs, gallops. BREASTS:  Without masses. ABDOMEN:  Soft, flat, nontender.  Vulva, vagina, cervix normal.  Last Pap was normal.  Uterus 8 week size, mobile, mid position.  Adnexa negative. EXTREMITIES/NEUROLOGIC:  Exam unremarkable.  IMPRESSION:  Symptomatic leiomyoma, menorrhagia with anemia.  PLAN:  LAVH.  Procedure and risks discussed as above.     [name] M. Matthew Aguilar, M.D.     RMH/MEDQ  D:  11/26/2014  T:  11/27/2014  Job:  [id number]

## 2014-12-02 ENCOUNTER — Encounter (HOSPITAL_BASED_OUTPATIENT_CLINIC_OR_DEPARTMENT_OTHER): Payer: Self-pay | Admitting: *Deleted

## 2014-12-02 LAB — ABO/RH: ABO/RH(D): O POS

## 2014-12-02 LAB — CBC
HCT: 31.8 % — ABNORMAL LOW (ref 36.0–46.0)
HEMOGLOBIN: 9.3 g/dL — AB (ref 12.0–15.0)
MCH: 22.2 pg — ABNORMAL LOW (ref 26.0–34.0)
MCHC: 29.2 g/dL — ABNORMAL LOW (ref 30.0–36.0)
MCV: 76.1 fL — AB (ref 78.0–100.0)
PLATELETS: 241 10*3/uL (ref 150–400)
RBC: 4.18 MIL/uL (ref 3.87–5.11)
RDW: 18.2 % — ABNORMAL HIGH (ref 11.5–15.5)
WBC: 6.6 10*3/uL (ref 4.0–10.5)

## 2014-12-02 LAB — TYPE AND SCREEN
ABO/RH(D): O POS
Antibody Screen: NEGATIVE

## 2014-12-02 NOTE — Progress Notes (Signed)
Interpreter requested for DOS. To arrive at 0600 til 0730, then 0900 -1100 at least.

## 2014-12-02 NOTE — Progress Notes (Signed)
Pt instructed npo pmn 2/10.  To [facility]:[place] 2/11 @ 0600. Pt aware that she will be admitted to [facility] overnite. Pt to come to [facility] today after office visit for cbc, T&S, urine hcg.

## 2014-12-05 ENCOUNTER — Inpatient Hospital Stay (HOSPITAL_BASED_OUTPATIENT_CLINIC_OR_DEPARTMENT_OTHER)
Admission: RE | Admit: 2014-12-05 | Discharge: 2014-12-08 | DRG: 743 | Disposition: A | Discharge status: Home / Self Care | Payer: BLUE CROSS/BLUE SHIELD | Source: Ambulatory Visit | Attending: Obstetrics and Gynecology | Admitting: Obstetrics and Gynecology

## 2014-12-05 ENCOUNTER — Ambulatory Visit (HOSPITAL_BASED_OUTPATIENT_CLINIC_OR_DEPARTMENT_OTHER): Payer: BLUE CROSS/BLUE SHIELD | Admitting: Anesthesiology

## 2014-12-05 ENCOUNTER — Encounter (HOSPITAL_BASED_OUTPATIENT_CLINIC_OR_DEPARTMENT_OTHER): Payer: Self-pay | Admitting: *Deleted

## 2014-12-05 ENCOUNTER — Encounter (HOSPITAL_COMMUNITY)
Admission: RE | Disposition: A | Discharge status: Home / Self Care | Payer: Self-pay | Source: Ambulatory Visit | Attending: Obstetrics and Gynecology

## 2014-12-05 DIAGNOSIS — N920 Excessive and frequent menstruation with regular cycle: Secondary | ICD-10-CM | POA: Diagnosis present

## 2014-12-05 DIAGNOSIS — D259 Leiomyoma of uterus, unspecified: Secondary | ICD-10-CM | POA: Diagnosis present

## 2014-12-05 DIAGNOSIS — D500 Iron deficiency anemia secondary to blood loss (chronic): Secondary | ICD-10-CM | POA: Diagnosis present

## 2014-12-05 DIAGNOSIS — D219 Benign neoplasm of connective and other soft tissue, unspecified: Secondary | ICD-10-CM | POA: Diagnosis present

## 2014-12-05 HISTORY — PX: LAPAROSCOPIC ASSISTED VAGINAL HYSTERECTOMY: SHX5398

## 2014-12-05 HISTORY — DX: Abnormal uterine and vaginal bleeding, unspecified: N93.9

## 2014-12-05 HISTORY — DX: Anemia, unspecified: D64.9

## 2014-12-05 HISTORY — PX: BILATERAL SALPINGECTOMY: SHX5743

## 2014-12-05 HISTORY — DX: Headache: R51

## 2014-12-05 HISTORY — DX: Headache, unspecified: R51.9

## 2014-12-05 LAB — POCT HEMOGLOBIN-HEMACUE: Hemoglobin: 9.6 g/dL — ABNORMAL LOW (ref 12.0–15.0)

## 2014-12-05 SURGERY — HYSTERECTOMY, VAGINAL, LAPAROSCOPY-ASSISTED
Anesthesia: General | Site: Abdomen

## 2014-12-05 MED ORDER — BUPIVACAINE LIPOSOME 1.3 % IJ SUSP
INTRAMUSCULAR | Status: DC | PRN
  Administered 2014-12-05: 17 mL

## 2014-12-05 MED ORDER — HYDROMORPHONE HCL 1 MG/ML IJ SOLN
INTRAMUSCULAR | Status: AC
  Filled 2014-12-05: qty 1

## 2014-12-05 MED ORDER — MORPHINE SULFATE (PF) 1 MG/ML IV SOLN
INTRAVENOUS | Status: AC
  Filled 2014-12-05: qty 25

## 2014-12-05 MED ORDER — MIDAZOLAM HCL 5 MG/5ML IJ SOLN
INTRAMUSCULAR | Status: DC | PRN
  Administered 2014-12-05 (×2): 1 mg via INTRAVENOUS

## 2014-12-05 MED ORDER — KETOROLAC TROMETHAMINE 30 MG/ML IJ SOLN
30.0000 mg | Freq: Four times a day (QID) | INTRAMUSCULAR | Status: DC
  Administered 2014-12-05 – 2014-12-08 (×12): 30 mg via INTRAVENOUS
  Filled 2014-12-05 (×18): qty 1

## 2014-12-05 MED ORDER — ONDANSETRON HCL 4 MG/2ML IJ SOLN
4.0000 mg | Freq: Four times a day (QID) | INTRAMUSCULAR | Status: DC | PRN
  Administered 2014-12-05 – 2014-12-07 (×6): 4 mg via INTRAVENOUS
  Filled 2014-12-05 (×5): qty 2

## 2014-12-05 MED ORDER — FENTANYL CITRATE 0.05 MG/ML IJ SOLN
INTRAMUSCULAR | Status: DC | PRN
  Administered 2014-12-05 (×12): 25 ug via INTRAVENOUS

## 2014-12-05 MED ORDER — SODIUM CHLORIDE 0.9 % IR SOLN
Status: DC | PRN
  Administered 2014-12-05: 500 mL

## 2014-12-05 MED ORDER — PROMETHAZINE HCL 25 MG/ML IJ SOLN
6.2500 mg | INTRAMUSCULAR | Status: DC | PRN
  Administered 2014-12-05: 6.25 mg via INTRAVENOUS
  Filled 2014-12-05: qty 1

## 2014-12-05 MED ORDER — BUTORPHANOL TARTRATE 2 MG/ML IJ SOLN: 1.0000 mg | INTRAMUSCULAR | Status: DC | PRN

## 2014-12-05 MED ORDER — OXYCODONE-ACETAMINOPHEN 5-325 MG PO TABS: 1.0000 | ORAL_TABLET | ORAL | Status: DC | PRN

## 2014-12-05 MED ORDER — NEOSTIGMINE METHYLSULFATE 10 MG/10ML IV SOLN
INTRAVENOUS | Status: DC | PRN
  Administered 2014-12-05: 4 mg via INTRAVENOUS

## 2014-12-05 MED ORDER — MIDAZOLAM HCL 2 MG/2ML IJ SOLN
INTRAMUSCULAR | Status: AC
  Filled 2014-12-05: qty 2

## 2014-12-05 MED ORDER — DIPHENHYDRAMINE HCL 12.5 MG/5ML PO ELIX: 12.5000 mg | ORAL_SOLUTION | Freq: Four times a day (QID) | ORAL | Status: DC | PRN

## 2014-12-05 MED ORDER — BUPIVACAINE HCL 0.25 % IJ SOLN
INTRAMUSCULAR | Status: DC | PRN
  Administered 2014-12-05: 7 mL

## 2014-12-05 MED ORDER — NALOXONE HCL 0.4 MG/ML IJ SOLN: 0.4000 mg | INTRAMUSCULAR | Status: DC | PRN

## 2014-12-05 MED ORDER — PROPOFOL 10 MG/ML IV BOLUS
INTRAVENOUS | Status: DC | PRN
  Administered 2014-12-05: 200 mg via INTRAVENOUS

## 2014-12-05 MED ORDER — LACTATED RINGERS IV SOLN
INTRAVENOUS | Status: DC | PRN
  Administered 2014-12-05 (×4): via INTRAVENOUS

## 2014-12-05 MED ORDER — CEFOTETAN DISODIUM-DEXTROSE 2-2.08 GM-% IV SOLR
INTRAVENOUS | Status: AC
  Filled 2014-12-05: qty 50

## 2014-12-05 MED ORDER — STERILE WATER FOR IRRIGATION IR SOLN
Status: DC | PRN
  Administered 2014-12-05: 500 mL

## 2014-12-05 MED ORDER — FENTANYL CITRATE 0.05 MG/ML IJ SOLN
INTRAMUSCULAR | Status: AC
  Filled 2014-12-05: qty 6

## 2014-12-05 MED ORDER — ROCURONIUM BROMIDE 100 MG/10ML IV SOLN
INTRAVENOUS | Status: DC | PRN
  Administered 2014-12-05 (×2): 10 mg via INTRAVENOUS
  Administered 2014-12-05: 30 mg via INTRAVENOUS
  Administered 2014-12-05 (×2): 10 mg via INTRAVENOUS

## 2014-12-05 MED ORDER — SODIUM CHLORIDE 0.9 % IJ SOLN: 9.0000 mL | INTRAMUSCULAR | Status: DC | PRN

## 2014-12-05 MED ORDER — ONDANSETRON HCL 4 MG PO TABS: 4.0000 mg | ORAL_TABLET | Freq: Four times a day (QID) | ORAL | Status: DC | PRN

## 2014-12-05 MED ORDER — KETOROLAC TROMETHAMINE 30 MG/ML IJ SOLN
INTRAMUSCULAR | Status: DC | PRN
  Administered 2014-12-05: 30 mg via INTRAVENOUS

## 2014-12-05 MED ORDER — KETOROLAC TROMETHAMINE 30 MG/ML IJ SOLN
30.0000 mg | Freq: Four times a day (QID) | INTRAMUSCULAR | Status: DC
  Filled 2014-12-05 (×12): qty 1

## 2014-12-05 MED ORDER — PROMETHAZINE HCL 25 MG/ML IJ SOLN
INTRAMUSCULAR | Status: AC
  Filled 2014-12-05: qty 1

## 2014-12-05 MED ORDER — LACTATED RINGERS IR SOLN
Status: DC | PRN
  Administered 2014-12-05: 3000 mL

## 2014-12-05 MED ORDER — GLYCOPYRROLATE 0.2 MG/ML IJ SOLN
INTRAMUSCULAR | Status: DC | PRN
  Administered 2014-12-05: 0.6 mg via INTRAVENOUS

## 2014-12-05 MED ORDER — DEXTROSE IN LACTATED RINGERS 5 % IV SOLN
INTRAVENOUS | Status: DC
  Administered 2014-12-05 – 2014-12-08 (×6): via INTRAVENOUS

## 2014-12-05 MED ORDER — MENTHOL 3 MG MT LOZG
1.0000 | LOZENGE | OROMUCOSAL | Status: DC | PRN
  Filled 2014-12-05: qty 9

## 2014-12-05 MED ORDER — ONDANSETRON HCL 4 MG/2ML IJ SOLN: 4.0000 mg | Freq: Four times a day (QID) | INTRAMUSCULAR | Status: DC | PRN

## 2014-12-05 MED ORDER — DEXAMETHASONE SODIUM PHOSPHATE 4 MG/ML IJ SOLN
INTRAMUSCULAR | Status: DC | PRN
  Administered 2014-12-05: 10 mg via INTRAVENOUS

## 2014-12-05 MED ORDER — LIDOCAINE HCL (CARDIAC) 20 MG/ML IV SOLN
INTRAVENOUS | Status: DC | PRN
  Administered 2014-12-05: 80 mg via INTRAVENOUS

## 2014-12-05 MED ORDER — IBUPROFEN 800 MG PO TABS
800.0000 mg | ORAL_TABLET | Freq: Three times a day (TID) | ORAL | Status: DC | PRN
  Administered 2014-12-08: 800 mg via ORAL
  Filled 2014-12-05: qty 1

## 2014-12-05 MED ORDER — DIPHENHYDRAMINE HCL 50 MG/ML IJ SOLN
12.5000 mg | Freq: Four times a day (QID) | INTRAMUSCULAR | Status: DC | PRN
  Administered 2014-12-06: 12.5 mg via INTRAVENOUS
  Filled 2014-12-05: qty 1

## 2014-12-05 MED ORDER — EPHEDRINE SULFATE 50 MG/ML IJ SOLN
INTRAMUSCULAR | Status: DC | PRN
  Administered 2014-12-05: 10 mg via INTRAVENOUS

## 2014-12-05 MED ORDER — KETOROLAC TROMETHAMINE 30 MG/ML IJ SOLN
30.0000 mg | Freq: Once | INTRAMUSCULAR | Status: DC
  Filled 2014-12-05: qty 1

## 2014-12-05 MED ORDER — ACETAMINOPHEN 10 MG/ML IV SOLN
INTRAVENOUS | Status: DC | PRN
  Administered 2014-12-05: 1000 mg via INTRAVENOUS

## 2014-12-05 MED ORDER — ONDANSETRON HCL 4 MG/2ML IJ SOLN
INTRAMUSCULAR | Status: DC | PRN
Start: 2014-12-05 — End: 2014-12-05
  Administered 2014-12-05: 4 mg via INTRAVENOUS

## 2014-12-05 MED ORDER — LACTATED RINGERS IV SOLN
INTRAVENOUS | Status: DC
  Administered 2014-12-05: 07:00:00 via INTRAVENOUS
  Filled 2014-12-05: qty 1000

## 2014-12-05 MED ORDER — HYDROMORPHONE HCL 1 MG/ML IJ SOLN
0.2500 mg | INTRAMUSCULAR | Status: DC | PRN
  Administered 2014-12-05 (×8): 0.25 mg via INTRAVENOUS
  Filled 2014-12-05: qty 1

## 2014-12-05 MED ORDER — DEXTROSE 5 % IV SOLN
2.0000 g | INTRAVENOUS | Status: AC
  Administered 2014-12-05: 2 g via INTRAVENOUS
  Filled 2014-12-05: qty 2

## 2014-12-05 MED ORDER — MORPHINE SULFATE (PF) 1 MG/ML IV SOLN
INTRAVENOUS | Status: DC
  Administered 2014-12-05: 18 mg via INTRAVENOUS
  Administered 2014-12-05: 28 mg via INTRAVENOUS
  Administered 2014-12-05 – 2014-12-06 (×3): via INTRAVENOUS
  Administered 2014-12-06: 12 mg via INTRAVENOUS
  Administered 2014-12-06: 7.77 mg via INTRAVENOUS
  Administered 2014-12-06: 9 mg via INTRAVENOUS
  Administered 2014-12-07: 112.78 mg via INTRAVENOUS
  Administered 2014-12-07: 7 mg via INTRAVENOUS
  Administered 2014-12-07: 11:00:00 via INTRAVENOUS
  Administered 2014-12-07: 14 mg via INTRAVENOUS
  Administered 2014-12-07: 17 mg via INTRAVENOUS
  Administered 2014-12-07: 18:00:00 via INTRAVENOUS
  Administered 2014-12-07: 12 mg via INTRAVENOUS
  Administered 2014-12-08: 7 mg via INTRAVENOUS
  Administered 2014-12-08: 1 mg via INTRAVENOUS
  Administered 2014-12-08: 8 mg via INTRAVENOUS
  Filled 2014-12-05 (×6): qty 25

## 2014-12-05 MED ORDER — METHYLENE BLUE 1 % INJ SOLN
INTRAMUSCULAR | Status: DC | PRN
  Administered 2014-12-05: 10 mL

## 2014-12-05 SURGICAL SUPPLY — 74 items
ALEXIS ×2 IMPLANT
BAG URINE DRAINAGE (UROLOGICAL SUPPLIES) ×4 IMPLANT
BLADE SURG 10 STRL SS (BLADE) ×8 IMPLANT
BLADE SURG 11 STRL SS (BLADE) ×4 IMPLANT
CATH FOLEY 2WAY SLVR  5CC 14FR (CATHETERS) ×2
CATH FOLEY 2WAY SLVR 5CC 14FR (CATHETERS) ×2 IMPLANT
CATH ROBINSON RED A/P 16FR (CATHETERS) ×4 IMPLANT
CLEANER CAUTERY TIP 5X5 PAD (MISCELLANEOUS) ×2 IMPLANT
COVER LIGHT HANDLE  1/PK (MISCELLANEOUS) ×4
COVER LIGHT HANDLE 1/PK (MISCELLANEOUS) IMPLANT
COVER MAYO STAND STRL (DRAPES) ×8 IMPLANT
COVER TABLE BACK 60X90 (DRAPES) ×8 IMPLANT
DRAPE UNDERBUTTOCKS STRL (DRAPE) ×4 IMPLANT
DRAPE WARM FLUID 44X44 (DRAPE) ×2 IMPLANT
DRSG OPSITE POSTOP 3X4 (GAUZE/BANDAGES/DRESSINGS) ×4 IMPLANT
ELECT LIGASURE LONG (ELECTRODE) ×4 IMPLANT
ELECT REM PT RETURN 9FT ADLT (ELECTROSURGICAL) ×4
ELECTRODE REM PT RTRN 9FT ADLT (ELECTROSURGICAL) ×2 IMPLANT
GAUZE SPONGE 4X4 16PLY XRAY LF (GAUZE/BANDAGES/DRESSINGS) ×2 IMPLANT
GLOVE BIO SURGEON STRL SZ 6.5 (GLOVE) ×4 IMPLANT
GLOVE BIO SURGEON STRL SZ7 (GLOVE) ×14 IMPLANT
GLOVE BIO SURGEONS STRL SZ 6.5 (GLOVE) ×4
GLOVE BIOGEL PI IND STRL 6.5 (GLOVE) IMPLANT
GLOVE BIOGEL PI INDICATOR 6.5 (GLOVE) ×4
GLOVE ECLIPSE 6.5 STRL STRAW (GLOVE) ×2 IMPLANT
GLOVE INDICATOR 7.0 STRL GRN (GLOVE) ×2 IMPLANT
GLOVE SURG ORTHO 8.0 STRL STRW (GLOVE) ×4 IMPLANT
GOWN STRL REUS W/ TWL LRG LVL3 (GOWN DISPOSABLE) IMPLANT
GOWN STRL REUS W/ TWL XL LVL3 (GOWN DISPOSABLE) IMPLANT
GOWN STRL REUS W/TWL LRG LVL3 (GOWN DISPOSABLE) ×4
GOWN STRL REUS W/TWL XL LVL3 (GOWN DISPOSABLE) ×12
HOLDER FOLEY CATH W/STRAP (MISCELLANEOUS) ×4 IMPLANT
LIQUID BAND (GAUZE/BANDAGES/DRESSINGS) ×6 IMPLANT
NDL HYPO 25X1 1.5 SAFETY (NEEDLE) ×2 IMPLANT
NDL INSUFFLATION 14GA 120MM (NEEDLE) ×2 IMPLANT
NDL SPNL 22GX3.5 QUINCKE BK (NEEDLE) ×2 IMPLANT
NEEDLE HYPO 25X1 1.5 SAFETY (NEEDLE) ×4 IMPLANT
NEEDLE INSUFFLATION 14GA 120MM (NEEDLE) ×4 IMPLANT
NEEDLE SPNL 22GX3.5 QUINCKE BK (NEEDLE) ×4 IMPLANT
NS IRRIG 500ML POUR BTL (IV SOLUTION) ×10 IMPLANT
PACK BASIN DAY SURGERY FS (CUSTOM PROCEDURE TRAY) ×6 IMPLANT
PAD CLEANER CAUTERY TIP 5X5 (MISCELLANEOUS) ×2
PAD OB MATERNITY 4.3X12.25 (PERSONAL CARE ITEMS) ×4 IMPLANT
PENCIL BUTTON HOLSTER BLD 10FT (ELECTRODE) ×8 IMPLANT
RETRACTOR WND ALEXIS 25 LRG (MISCELLANEOUS) IMPLANT
RTRCTR WOUND ALEXIS 25CM LRG (MISCELLANEOUS) ×3
SEALER TISSUE G2 CVD JAW 35 (ENDOMECHANICALS) IMPLANT
SEALER TISSUE G2 CVD JAW 45CM (ENDOMECHANICALS) ×2 IMPLANT
SHEET LAVH (DRAPES) ×4 IMPLANT
SOLUTION ANTI FOG 6CC (MISCELLANEOUS) ×4 IMPLANT
SPONGE LAP 18X18 X RAY DECT (DISPOSABLE) ×4 IMPLANT
SPONGE LAP 4X18 X RAY DECT (DISPOSABLE) ×4 IMPLANT
SUT MON AB 2-0 CT1 36 (SUTURE) ×8 IMPLANT
SUT PDS AB 0 CT1 36 (SUTURE) ×4 IMPLANT
SUT VIC AB 0 CT1 18XCR BRD8 (SUTURE) ×6 IMPLANT
SUT VIC AB 0 CT1 8-18 (SUTURE) ×16
SUT VIC AB 3-0 CT1 27 (SUTURE) ×4
SUT VIC AB 3-0 CT1 TAPERPNT 27 (SUTURE) IMPLANT
SUT VICRYL 0 TIES 12 18 (SUTURE) ×4 IMPLANT
SUT VICRYL 4-0 PS2 18IN ABS (SUTURE) ×6 IMPLANT
SYR BULB IRRIGATION 50ML (SYRINGE) ×6 IMPLANT
SYR CONTROL 10ML LL (SYRINGE) ×4 IMPLANT
SYRINGE 10CC LL (SYRINGE) ×8 IMPLANT
SYRINGE IRR TOOMEY STRL 70CC (SYRINGE) ×2 IMPLANT
TOWEL OR 17X24 6PK STRL BLUE (TOWEL DISPOSABLE) ×10 IMPLANT
TRAY DSU PREP LF (CUSTOM PROCEDURE TRAY) ×4 IMPLANT
TROCAR OPTI TIP 5M 100M (ENDOMECHANICALS) ×4 IMPLANT
TROCAR XCEL DIL TIP R 11M (ENDOMECHANICALS) ×4 IMPLANT
TUBE CONNECTING 12'X1/4 (SUCTIONS) ×3
TUBE CONNECTING 12X1/4 (SUCTIONS) ×7 IMPLANT
TUBING INSUFFLATION 10FT LAP (TUBING) ×4 IMPLANT
WARMER LAPAROSCOPE (MISCELLANEOUS) ×4 IMPLANT
WATER STERILE IRR 500ML POUR (IV SOLUTION) ×4 IMPLANT
YANKAUER SUCT BULB TIP NO VENT (SUCTIONS) ×6 IMPLANT

## 2014-12-05 NOTE — Transfer of Care (Signed)
Immediate Anesthesia Transfer of Care Note  Patient: Amy Aguilar  Procedure(s) Performed: Procedure(s) (LRB): LAPAROSCOPY, ATTEMPTED VAGINAL HYSTERECTOMY, TOTAL ABDOMINAL HYSTERECTOMY (N/A) BILATERAL SALPINGECTOMY (Bilateral)  Patient Location: PACU  Anesthesia Type: General  Level of Consciousness: awake, sedated, patient cooperative and responds to stimulation  Airway & Oxygen Therapy: Patient Spontanous Breathing and Patient connected to face mask oxygen  Post-op Assessment: Report given to PACU RN, Post -op Vital signs reviewed and stable and Patient moving all extremities  Post vital signs: Reviewed and stable  Complications: No apparent anesthesia complications

## 2014-12-05 NOTE — Anesthesia Procedure Notes (Addendum)
Procedure Name: Intubation Date/Time: 12/05/2014 7:30 AM Performed by: Justice Rocher Pre-anesthesia Checklist: Patient identified, Emergency Drugs available, Suction available and Patient being monitored Patient Re-evaluated:Patient Re-evaluated prior to inductionOxygen Delivery Method: Circle System Utilized Preoxygenation: Pre-oxygenation with 100% oxygen Intubation Type: IV induction Ventilation: Mask ventilation without difficulty Laryngoscope Size: Mac and 3 Grade View: Grade II Tube type: Oral Tube size: 7.0 mm Number of attempts: 1 Airway Equipment and Method: Stylet and Oral airway Placement Confirmation: ETT inserted through vocal cords under direct vision,  positive ETCO2 and breath sounds checked- equal and bilateral Secured at: 21 cm Tube secured with: Tape Dental Injury: Teeth and Oropharynx as per pre-operative assessment

## 2014-12-05 NOTE — Anesthesia Preprocedure Evaluation (Signed)
Anesthesia Evaluation  Patient identified by MRN, date of birth, ID band Patient awake    Reviewed: Allergy & Precautions, NPO status , Patient's Chart, lab work & pertinent test results  Airway Mallampati: II  TM Distance: >3 FB Neck ROM: Full    Dental no notable dental hx.    Pulmonary neg pulmonary ROS,  breath sounds clear to auscultation  Pulmonary exam normal       Cardiovascular hypertension, Rhythm:Regular Rate:Normal     Neuro/Psych negative neurological ROS  negative psych ROS   GI/Hepatic negative GI ROS, Neg liver ROS,   Endo/Other  negative endocrine ROS  Renal/GU negative Renal ROS  negative genitourinary   Musculoskeletal negative musculoskeletal ROS (+)   Abdominal   Peds negative pediatric ROS (+)  Hematology  (+) anemia ,   Anesthesia Other Findings   Reproductive/Obstetrics negative OB ROS                             Anesthesia Physical Anesthesia Plan  ASA: II  Anesthesia Plan: General   Post-op Pain Management:    Induction: Intravenous  Airway Management Planned: Oral ETT  Additional Equipment:   Intra-op Plan:   Post-operative Plan: Extubation in OR  Informed Consent: I have reviewed the patients History and Physical, chart, labs and discussed the procedure including the risks, benefits and alternatives for the proposed anesthesia with the patient or authorized representative who has indicated his/her understanding and acceptance.   Dental advisory given  Plan Discussed with: CRNA and Surgeon  Anesthesia Plan Comments:         Anesthesia Quick Evaluation

## 2014-12-05 NOTE — Progress Notes (Signed)
The patient was re-examined with no change in status 

## 2014-12-05 NOTE — Op Note (Signed)
Sula Soda  DICTATION # [id number] CSN# [id number]   Margarette Asal, MD 12/05/2014 10:51 AM

## 2014-12-05 NOTE — Anesthesia Postprocedure Evaluation (Signed)
  Anesthesia Post-op Note  Patient: Amy Aguilar  Procedure(s) Performed: Procedure(s) (LRB): LAPAROSCOPY, ATTEMPTED VAGINAL HYSTERECTOMY, TOTAL ABDOMINAL HYSTERECTOMY (N/A) BILATERAL SALPINGECTOMY (Bilateral)  Patient Location: PACU  Anesthesia Type: General  Level of Consciousness: awake and alert   Airway and Oxygen Therapy: Patient Spontanous Breathing  Post-op Pain: mild  Post-op Assessment: Post-op Vital signs reviewed, Patient's Cardiovascular Status Stable, Respiratory Function Stable, Patent Airway and No signs of Nausea or vomiting  Last Vitals:  Filed Vitals:   12/05/14 1035  BP:   Pulse: 70  Temp:   Resp: 16    Post-op Vital Signs: stable   Complications: No apparent anesthesia complications

## 2014-12-06 LAB — CBC
HCT: 23.2 % — ABNORMAL LOW (ref 36.0–46.0)
Hemoglobin: 6.8 g/dL — CL (ref 12.0–15.0)
MCH: 22.2 pg — ABNORMAL LOW (ref 26.0–34.0)
MCHC: 29.3 g/dL — AB (ref 30.0–36.0)
MCV: 75.8 fL — ABNORMAL LOW (ref 78.0–100.0)
PLATELETS: 245 10*3/uL (ref 150–400)
RBC: 3.06 MIL/uL — ABNORMAL LOW (ref 3.87–5.11)
RDW: 18.2 % — AB (ref 11.5–15.5)
WBC: 11.7 10*3/uL — ABNORMAL HIGH (ref 4.0–10.5)

## 2014-12-06 LAB — PREPARE RBC (CROSSMATCH)

## 2014-12-06 MED ORDER — DIPHENHYDRAMINE HCL 25 MG PO CAPS
25.0000 mg | ORAL_CAPSULE | Freq: Once | ORAL | Status: AC
  Administered 2014-12-06: 25 mg via ORAL
  Filled 2014-12-06: qty 1

## 2014-12-06 MED ORDER — SODIUM CHLORIDE 0.9 % IV SOLN
Freq: Once | INTRAVENOUS | Status: AC
  Administered 2014-12-06: 12:00:00 via INTRAVENOUS

## 2014-12-06 MED ORDER — BOOST / RESOURCE BREEZE PO LIQD
1.0000 | Freq: Three times a day (TID) | ORAL | Status: DC
  Administered 2014-12-06 – 2014-12-07 (×2): 1 via ORAL

## 2014-12-06 MED ORDER — ACETAMINOPHEN 325 MG PO TABS
650.0000 mg | ORAL_TABLET | Freq: Once | ORAL | Status: AC
  Administered 2014-12-06: 650 mg via ORAL
  Filled 2014-12-06: qty 2

## 2014-12-06 NOTE — Progress Notes (Signed)
INITIAL NUTRITION ASSESSMENT  DOCUMENTATION CODES Per approved criteria  -Not Applicable   INTERVENTION: - Resource Breeze po TID, each supplement provides 250 kcal and 9 grams of protein  NUTRITION DIAGNOSIS: Inadequate oral intake related to nausea as evidenced by reported poor po and wt loss.   Goal: Pt to meet >/= 90% of their estimated nutrition needs   Monitor:  Weight trend, po intake, acceptance of supplements, labs  Reason for Assessment: Malnutrition Screening Tool  39 y.o. female  Admitting Dx: <principal problem not specified>  ASSESSMENT: - 2/11 Pt s/p LAPAROSCOPY, ATTEMPTED VAGINAL HYSTERECTOMY, TOTAL ABDOMINAL HYSTERECTOMY (N/A) BILATERAL SALPINGECTOMY (Bilateral)  - Pt reports a poor appetite due to nausea. She has had a 7 lb wt loss over the past several months. Usual body weight is 180 lbs. She agreed to try nutrition supplements to improve po intake. Will order clear liquid supplements.  - No signs of fat or muscle wasting - Labs and medications reviewed  Height: Ht Readings from Last 1 Encounters:  12/05/14 5\' 3"  (1.6 m)    Weight: Wt Readings from Last 1 Encounters:  12/05/14 173 lb (78.472 kg)    Ideal Body Weight: 52.4 kg  % Ideal Body Weight: 150%  Wt Readings from Last 10 Encounters:  12/05/14 173 lb (78.472 kg)  11/21/14 173 lb (78.472 kg)  09/18/12 183 lb (83.008 kg)  08/30/12 183 lb (83.008 kg)  08/08/12 183 lb (83.008 kg)  07/04/12 186 lb (84.369 kg)    BMI:  Body mass index is 30.65 kg/(m^2).  Estimated Nutritional Needs: Kcal: 1600-1800 Protein: 90-100 g Fluid: 1.8 L/day  Skin: closed incisions on abdomen and vagina  Diet Order: Diet regular  EDUCATION NEEDS: -Education needs addressed   Intake/Output Summary (Last 24 hours) at 12/06/14 1254 Last data filed at 12/06/14 1000  Gross per 24 hour  Intake 2322.08 ml  Output   1975 ml  Net 347.08 ml    Last BM: prior to admission   Labs:  No results for  input(s): NA, K, CL, CO2, BUN, CREATININE, CALCIUM, MG, PHOS, GLUCOSE in the last 168 hours.  CBG (last 3)  No results for input(s): GLUCAP in the last 72 hours.  Scheduled Meds: . sodium chloride   Intravenous Once  . acetaminophen  650 mg Oral Once  . diphenhydrAMINE  25 mg Oral Once  . ketorolac  30 mg Intravenous 4 times per day   Or  . ketorolac  30 mg Intramuscular 4 times per day  . morphine   Intravenous 6 times per day    Continuous Infusions: . dextrose 5% lactated ringers 125 mL/hr at 12/06/14 0006    Past Medical History  Diagnosis Date  . Leiomyoma of uterus   . Hypertension     pt states my bp is fine  . Headache     migraines  . Anemia     received blood transfusion 2 wks ago  . Abnormal uterine bleeding (AUB)     Past Surgical History  Procedure Laterality Date  . No past surgeries      Laurette Schimke MS, RD, LDN

## 2014-12-06 NOTE — Progress Notes (Signed)
CRITICAL VALUE ALERT  Critical value received: HGB 6.8   Date of notification:  2/12  Time of notification:  0530  Critical value read back:Yes.    Nurse who received alert:  Kathryne Hitch RN  MD notified (1st page): [phone]    Time of first page:  0545  MD notified (2nd page):  Time of second page:  Responding MD: YES  Time MD responded:  *0545**

## 2014-12-06 NOTE — Progress Notes (Signed)
Pt has critical HGB 6.8. Notified MD on call. No new orders yet.

## 2014-12-06 NOTE — Care Management Note (Signed)
    Page 1 of 1   12/06/2014     12:34:39 PM CARE MANAGEMENT NOTE 12/06/2014  Patient:  Amy Aguilar   Account Number:  [id number]  Date Initiated:  12/06/2014  Documentation initiated by:  Sunday Spillers  Subjective/Objective Assessment:   39 yo female admitted s/p TAH. PTA lived at home with spouse.     Action/Plan:   Home when stable   Anticipated DC Date:  12/06/2014   Anticipated DC Plan:  [place]  CM consult      Choice offered to / List presented to:             Status of service:  Completed, signed off Medicare Important Message given?   (If response is "NO", the following Medicare IM given date fields will be blank) Date Medicare IM given:   Medicare IM given by:   Date Additional Medicare IM given:   Additional Medicare IM given by:    Discharge Disposition:  HOME/SELF CARE  Per UR Regulation:  Reviewed for med. necessity/level of care/duration of stay  If discussed at [place] of Stay Meetings, dates discussed:    Comments:

## 2014-12-06 NOTE — Progress Notes (Deleted)
CRITICAL VALUE ALERT  Critical value received:HGB**  Date of notification:  12/06/2014*  Time of notification: 0500   Critical value read back:Yes.    Nurse who received alert:Laporsche Hoeger RN   MD notified (1st page): 0510  Time of first page:  0510  MD notified (2nd page):  Time of second page:  Responding MD:  YES  Time MD responded:  *2725**

## 2014-12-06 NOTE — Progress Notes (Signed)
Call from RN re pt's orthostasis with attempted ambulation, Hgb 6+ this am>>will transfuse 2 u PRBC for symptomatic anemia

## 2014-12-06 NOTE — Progress Notes (Signed)
1 Day Post-Op Procedure(s) (LRB): LAPAROSCOPY, ATTEMPTED VAGINAL HYSTERECTOMY, TOTAL ABDOMINAL HYSTERECTOMY (N/A) BILATERAL SALPINGECTOMY (Bilateral)  Subjective: Patient reports tolerating PO.    Objective: I have reviewed patient's vital signs, medications and labs.  abd soft + BS, Inc C/D CBC    Component Value Date/Time   WBC 11.7* 12/06/2014 0410   WBC 6.2 08/08/2012 1153   RBC 3.06* 12/06/2014 0410   RBC 4.70 08/08/2012 1153   HGB 6.8* 12/06/2014 0410   HGB 12.4 08/08/2012 1153   HCT 23.2* 12/06/2014 0410   HCT 41.5 08/08/2012 1153   PLT 245 12/06/2014 0410   MCV 75.8* 12/06/2014 0410   MCV 88.3 08/08/2012 1153   MCH 22.2* 12/06/2014 0410   MCH 26.4* 08/08/2012 1153   MCHC 29.3* 12/06/2014 0410   MCHC 29.9* 08/08/2012 1153   RDW 18.2* 12/06/2014 0410     Assessment: s/p Procedure(s): LAPAROSCOPY, ATTEMPTED VAGINAL HYSTERECTOMY, TOTAL ABDOMINAL HYSTERECTOMY (N/A) BILATERAL SALPINGECTOMY (Bilateral): stable  Plan: incr diet, OOB, f/u CBC in AM  LOS: 1 day    Fleeta Kunde M 12/06/2014, 7:15 AM

## 2014-12-06 NOTE — Op Note (Signed)
Amy Aguilar  ACCOUNT NO.:  [id number]  MEDICAL RECORD NO.:  [id number]  LOCATION:  1536                         FACILITY:  [facility]  PHYSICIAN:  Ralene Bathe. Matthew Saras, M.D.DATE OF BIRTH:  Feb 08, 1976  DATE OF PROCEDURE:  12/05/2014 DATE OF DISCHARGE:                              OPERATIVE REPORT   PREOPERATIVE DIAGNOSES:  Symptomatic leiomyoma with menorrhagia and anemia.  POSTOPERATIVE DIAGNOSES:  Symptomatic leiomyoma with menorrhagia and anemia, large 7-cm low cervical fibroid.  PROCEDURES:  Laparoscopy, attempted vaginal hysterectomy, with vaginal myomectomy followed by total abdominal hysterectomy, bilateral salpingectomy.  SURGEON:  Ralene Bathe. Matthew Saras, M.D.  ASSISTANT:  Monia Sabal. Corinna Capra, M.D.  ANESTHESIA:  General endotracheal.  COMPLICATIONS:  None.  DRAINS:  Foley catheter.  BLOOD LOSS:  300 mL.  PROCEDURE AND FINDINGS:  The patient was taken to the operating room. After an adequate level of general anesthesia was obtained with the patient's legs in stirrups, the abdomen, perineum and vagina were prepped and draped in the usual fashion for hysterectomy.  Bladder was drained.  EUA was carried out.  Uterus 10 week size, mobile.  Adnexa unremarkable.  Hulka tenaculum was position.  This was done after appropriate time-outs were taken.  Attention was directed to the abdomen where the subumbilical area was infiltrated with 0.25% Marcaine plain.  A small incision was made and the Veress needle was introduced without difficulty.  After 3.5 liters of pneumoperitoneum was then created, laparoscopic trocar and sleeve were then introduced that difficulty.  There was no evidence of any bleeding or trauma.  Three fingerbreadths above the symphysis in the midline, a 5-mm trocar was inserted under direct visualization.  The patient was placed in Trendelenburg and the pelvic findings as follows:  Uterus itself was symmetrically enlarged, bilateral adnexa  unremarkable, the cul-de-sac was free and clear.  The bladder area looked unremarkable.  Using the EnSeal device, the utero-ovarian pedicles were coagulated down to and including the round ligament on each side.  These areas were hemostatic.  Attention was directed to the vaginal portion of the procedure at this point.  Legs were extended.  Weighted speculum was positioned.  Cervix was grasped with tenaculum.  The cervical vaginal mucosa was incised, and sharp and blunt dissection was used to elevate the bladder superiorly. The cervix was large and patulous, and appeared to have a leading edge of the fibroid prolapsing through the cervix.  Cervical anatomy therefore was somewhat distorted, initial dissection of the posterior culdotomy was difficult.  The prolapsing fibroid was grasped with the thyroid tenaculum and using surgeon's blunt finger dissection, large 7- cm fibroid was completely removed from its submucous location.  At this point, posterior culdotomy was performed and a longer weighted speculum was position.  However, the anatomy was so distorted anteriorly and particular separation between uterus, bladder and vaginal mucosa was very distorted.  Decision was made to complete the procedure abdominally.  Legs were placed down, Pfannenstiel incision was made 2 fingerbreadths above the symphysis, carried down to the fascia, which was incised and extended transversely.  Rectus muscle was divided in the midline.  Peritoneum entered superiorly without incident and extended vertically.  An Alexis retractor was positioned.  Bowels packed superiorly out of the field and the patient was then  placed in Trendelenburg.  Long Kelly clamps were then placed at each utero-ovarian margin.  The uterus itself at that point looked fairly normal.  The anterior peritoneum was dissected and the bladder was dissected well below the cervicovaginal junction.  The area of the prior posterior colpotomy  could be identified.  Uterine vasculature pedicles on each side were skeletonized, clamped, divided and suture-ligated with 0 Vicryl.  In sequential fashion, the cardinal ligament, uterosacral ligaments, and remaining cervicovaginal pedicles were clamped, divided and suture ligated with 0 Vicryl suture.  The anterior and posterior edges of the vaginal mucosa were then held with Kocher clamps, and the vagina was closed with interrupted 0 Vicryl sutures.  The operative site was hemostatic.  Once this was completed, the bladder was backfilled under pressure with diluted methylene blue, no evidence of bladder injury.  The catheter was placed to straight drain.  The pelvis was irrigated with saline and aspirated.  Review of the operative site revealed it to be hemostatic.  Remaining tubal segments were clamped, divided and free tied with 0 Vicryl suture.  Both ovaries were conserved and were otherwise normal.  The upper abdomen was unremarkable.  Prior to closure, sponge, needle and instrument counts reported as correct x2.  Peritoneum was closed with 3-0 Vicryl running suture.  A 3-0 Vicryl interrupted suture was used to close the rectus muscles in the midline.  A 0 PDS suture was then used to close the fascia from laterally to midline on either side. Diluted Exparel solution was then used to inject the fascial line in the subcutaneous tissue.  Subcu was irrigated, noted to be hemostatic, was fairly thin, 4-0 Monocryl subcuticular closure with the honeycomb dressing.  The lower laparoscopic incision was closed with Dermabond. The subumbilical incision with the subcuticular 4-0 Vicryl suture.  She tolerated this well, went to recovery room in good condition.     Gianni Fuchs M. Matthew Saras, M.D.     RMH/MEDQ  D:  12/05/2014  T:  12/06/2014  Job:  [id number]

## 2014-12-07 LAB — CBC
HCT: 28.5 % — ABNORMAL LOW (ref 36.0–46.0)
HEMATOCRIT: 28.2 % — AB (ref 36.0–46.0)
Hemoglobin: 8.7 g/dL — ABNORMAL LOW (ref 12.0–15.0)
Hemoglobin: 8.8 g/dL — ABNORMAL LOW (ref 12.0–15.0)
MCH: 24.2 pg — ABNORMAL LOW (ref 26.0–34.0)
MCH: 24.1 pg — ABNORMAL LOW (ref 26.0–34.0)
MCHC: 30.9 g/dL (ref 30.0–36.0)
MCHC: 30.9 g/dL (ref 30.0–36.0)
MCV: 78.6 fL (ref 78.0–100.0)
MCV: 78.1 fL (ref 78.0–100.0)
Platelets: 220 10*3/uL (ref 150–400)
Platelets: 214 10*3/uL (ref 150–400)
RBC: 3.59 MIL/uL — AB (ref 3.87–5.11)
RBC: 3.65 MIL/uL — AB (ref 3.87–5.11)
RDW: 17.7 % — ABNORMAL HIGH (ref 11.5–15.5)
RDW: 17.6 % — ABNORMAL HIGH (ref 11.5–15.5)
WBC: 9.9 10*3/uL (ref 4.0–10.5)
WBC: 11.5 10*3/uL — ABNORMAL HIGH (ref 4.0–10.5)

## 2014-12-07 LAB — PREPARE RBC (CROSSMATCH)

## 2014-12-07 MED ORDER — SODIUM CHLORIDE 0.9 % IV SOLN
Freq: Once | INTRAVENOUS | Status: AC
  Administered 2014-12-07: 14:00:00 via INTRAVENOUS

## 2014-12-07 MED ORDER — ACETAMINOPHEN 325 MG PO TABS
650.0000 mg | ORAL_TABLET | Freq: Once | ORAL | Status: AC
  Administered 2014-12-07: 650 mg via ORAL
  Filled 2014-12-07: qty 2

## 2014-12-07 NOTE — Progress Notes (Signed)
2 Days Post-Op Procedure(s) (LRB): LAPAROSCOPY, ATTEMPTED VAGINAL HYSTERECTOMY, TOTAL ABDOMINAL HYSTERECTOMY (N/A) BILATERAL SALPINGECTOMY (Bilateral)  Subjective: Patient reports nausea.  Still with some orthostasis after 2 U PRBC  Objective: I have reviewed patient's vital signs, medications and labs.  abd soft + BS>.Inc C/D  Assessment: s/p Procedure(s): LAPAROSCOPY, ATTEMPTED VAGINAL HYSTERECTOMY, TOTAL ABDOMINAL HYSTERECTOMY (N/A) BILATERAL SALPINGECTOMY (Bilateral): stable  Plan: will transfuse another u PRBC, plan D/C tomorrow  LOS: 2 days    Berdell Hostetler M 12/07/2014, 8:20 AM

## 2014-12-08 LAB — TYPE AND SCREEN
ABO/RH(D): O POS
Antibody Screen: NEGATIVE
UNIT DIVISION: 0
UNIT DIVISION: 0
Unit division: 0

## 2014-12-08 LAB — CBC
HCT: 30.4 % — ABNORMAL LOW (ref 36.0–46.0)
HEMOGLOBIN: 9.4 g/dL — AB (ref 12.0–15.0)
MCH: 24.5 pg — AB (ref 26.0–34.0)
MCHC: 30.9 g/dL (ref 30.0–36.0)
MCV: 79.2 fL (ref 78.0–100.0)
Platelets: 209 10*3/uL (ref 150–400)
RBC: 3.84 MIL/uL — AB (ref 3.87–5.11)
RDW: 17.3 % — ABNORMAL HIGH (ref 11.5–15.5)
WBC: 9.8 10*3/uL (ref 4.0–10.5)

## 2014-12-08 MED ORDER — OXYCODONE-ACETAMINOPHEN 5-325 MG PO TABS: 1.0000 | ORAL_TABLET | ORAL | Status: DC | PRN

## 2014-12-08 NOTE — Progress Notes (Signed)
Assessment unchanged. Pt verbalized understanding of dc instructions through teach back. Pt understands follow up care and when to call if any problems arise. Pt speaks and understands english yet as a courtesy provided AVS in both [place] and [place]. Script x 1 given as provided by MD. Discharged via wc to front entrance to meet awaiting vehicle to carry home. Accompanied by husband and NT.

## 2014-12-08 NOTE — Discharge Summary (Signed)
Physician Discharge Summary  Patient ID: Amy Aguilar MRN: [id number] DOB/AGE: 12-09-1975 39 y.o.  Admit date: 12/05/2014 Discharge date: 12/08/2014  Admission Diagnoses:Menorrhagia, leiomyoma, anemia  Discharge Diagnoses: same, TAH Active Problems:   Fibroids   Discharged Condition: good  Hospital Course: adm for LAVH, had intraop change to TAH, on POD 1, symptomatic anemia>>transf 2 u PRBC, diet advanced  On POD #2, was afeb, abd soft + BS, still with some orthostasis>>received third unit PRBC with significant improvement   Consults: None  Significant Diagnostic Studies: labs:  CBC    Component Value Date/Time   WBC 9.8 12/08/2014 0547   WBC 6.2 08/08/2012 1153   RBC 3.84* 12/08/2014 0547   RBC 4.70 08/08/2012 1153   HGB 9.4* 12/08/2014 0547   HGB 12.4 08/08/2012 1153   HCT 30.4* 12/08/2014 0547   HCT 41.5 08/08/2012 1153   PLT 209 12/08/2014 0547   MCV 79.2 12/08/2014 0547   MCV 88.3 08/08/2012 1153   MCH 24.5* 12/08/2014 0547   MCH 26.4* 08/08/2012 1153   MCHC 30.9 12/08/2014 0547   MCHC 29.9* 08/08/2012 1153   RDW 17.3* 12/08/2014 0547      Treatments: surgery: TAH, bilat salpingectomy  Discharge Exam: Blood pressure 122/78, pulse 74, temperature 98.5 F (36.9 C), temperature source Oral, resp. rate 18, height 5\' 3"  (1.6 m), weight 173 lb (78.472 kg), SpO2 99 %. abd soft + BS, Inc C/D  Disposition: 01-Home or Self Care     Medication List    TAKE these medications        ibuprofen 800 MG tablet  Commonly known as:  ADVIL,MOTRIN  Take 800 mg by mouth every 8 (eight) hours as needed for moderate pain.     IRON PO  Take 1 tablet by mouth daily.     oxyCODONE-acetaminophen 5-325 MG per tablet  Commonly known as:  PERCOCET/ROXICET  Take 1-2 tablets by mouth every 4 (four) hours as needed for severe pain (moderate to severe pain (when tolerating fluids)).     traMADol 50 MG tablet  Commonly known as:  ULTRAM  Take 100 mg by mouth  every 8 (eight) hours as needed for moderate pain.           Follow-up Information    Follow up with Margarette Asal, MD In 1 week.   Specialty:  Obstetrics and Gynecology   Why:  office will call   Contact information:   [address] [place] [address] [phone]       Signed: Margarette Asal 12/08/2014, 8:08 AM

## 2014-12-09 ENCOUNTER — Encounter (HOSPITAL_BASED_OUTPATIENT_CLINIC_OR_DEPARTMENT_OTHER): Payer: Self-pay | Admitting: Obstetrics and Gynecology

## 2015-02-22 ENCOUNTER — Encounter (HOSPITAL_COMMUNITY): Payer: Self-pay | Admitting: Emergency Medicine

## 2015-02-22 ENCOUNTER — Emergency Department (HOSPITAL_COMMUNITY)
Admission: EM | Admit: 2015-02-22 | Discharge: 2015-02-22 | Disposition: A | Discharge status: Home / Self Care | Payer: BLUE CROSS/BLUE SHIELD | Attending: Emergency Medicine | Admitting: Emergency Medicine

## 2015-02-22 DIAGNOSIS — N390 Urinary tract infection, site not specified: Secondary | ICD-10-CM | POA: Diagnosis not present

## 2015-02-22 DIAGNOSIS — D509 Iron deficiency anemia, unspecified: Secondary | ICD-10-CM | POA: Insufficient documentation

## 2015-02-22 DIAGNOSIS — Z8541 Personal history of malignant neoplasm of cervix uteri: Secondary | ICD-10-CM | POA: Diagnosis not present

## 2015-02-22 DIAGNOSIS — Z79899 Other long term (current) drug therapy: Secondary | ICD-10-CM | POA: Diagnosis not present

## 2015-02-22 DIAGNOSIS — Z8742 Personal history of other diseases of the female genital tract: Secondary | ICD-10-CM | POA: Diagnosis not present

## 2015-02-22 DIAGNOSIS — R300 Dysuria: Secondary | ICD-10-CM | POA: Diagnosis present

## 2015-02-22 DIAGNOSIS — I10 Essential (primary) hypertension: Secondary | ICD-10-CM | POA: Insufficient documentation

## 2015-02-22 LAB — URINE MICROSCOPIC-ADD ON

## 2015-02-22 LAB — URINALYSIS, ROUTINE W REFLEX MICROSCOPIC
Bilirubin Urine: NEGATIVE
Glucose, UA: NEGATIVE mg/dL
Ketones, ur: NEGATIVE mg/dL
NITRITE: NEGATIVE
PH: 5.5 (ref 5.0–8.0)
Protein, ur: 30 mg/dL — AB
SPECIFIC GRAVITY, URINE: 1.03 (ref 1.005–1.030)
Urobilinogen, UA: 0.2 mg/dL (ref 0.0–1.0)

## 2015-02-22 MED ORDER — IBUPROFEN 800 MG PO TABS: 800.0000 mg | ORAL_TABLET | Freq: Three times a day (TID) | ORAL | Status: DC | PRN

## 2015-02-22 MED ORDER — SULFAMETHOXAZOLE-TRIMETHOPRIM 800-160 MG PO TABS: 1.0000 | ORAL_TABLET | Freq: Two times a day (BID) | ORAL | Status: DC

## 2015-02-22 MED ORDER — PHENAZOPYRIDINE HCL 200 MG PO TABS: 200.0000 mg | ORAL_TABLET | Freq: Three times a day (TID) | ORAL | Status: DC | PRN

## 2015-02-22 MED ORDER — PHENAZOPYRIDINE HCL 100 MG PO TABS
200.0000 mg | ORAL_TABLET | Freq: Once | ORAL | Status: AC
  Administered 2015-02-22: 200 mg via ORAL
  Filled 2015-02-22: qty 2

## 2015-02-22 MED ORDER — SULFAMETHOXAZOLE-TRIMETHOPRIM 800-160 MG PO TABS
1.0000 | ORAL_TABLET | Freq: Once | ORAL | Status: AC
Start: 2015-02-22 — End: 2015-02-22
  Administered 2015-02-22: 1 via ORAL
  Filled 2015-02-22: qty 1

## 2015-02-22 MED ORDER — IBUPROFEN 800 MG PO TABS
800.0000 mg | ORAL_TABLET | Freq: Once | ORAL | Status: AC
  Administered 2015-02-22: 800 mg via ORAL
  Filled 2015-02-22: qty 1

## 2015-02-22 NOTE — Discharge Instructions (Signed)
Infección urinaria  °(Urinary Tract Infection) ° La infección urinaria puede ocurrir en cualquier lugar del tracto urinario. El tracto urinario es un sistema de drenaje del cuerpo por el que se eliminan los desechos y el exceso de agua. El tracto urinario está formado por dos riñones, dos uréteres, la vejiga y la uretra. Los riñones son órganos que tienen forma de frijol. Cada riñón tiene aproximadamente el tamaño del puño. Están situados debajo de las costillas, uno a cada lado de la columna vertebral °CAUSAS  °La causa de la infección son los microbios, que son organismos microscópicos, que incluyen hongos, virus, y bacterias. Estos organismos son tan pequeños que sólo pueden verse a través del microscopio. Las bacterias son los microorganismos que más comúnmente causan infecciones urinarias.  °SÍNTOMAS  °Los síntomas pueden variar según la edad y el sexo del paciente y por la ubicación de la infección. Los síntomas en las mujeres jóvenes incluyen la necesidad frecuente e intensa de orinar y una sensación dolorosa de ardor en la vejiga o en la uretra durante la micción. Las mujeres y los hombres mayores podrán sentir cansancio, temblores y debilidad y sentir dolores musculares y dolor abdominal. Si tiene fiebre, puede significar que la infección está en los riñones. Otros síntomas son dolor en la espalda o en los lados debajo de las costillas, náuseas y vómitos.  °DIAGNÓSTICO  °Para diagnosticar una infección urinaria, el médico le preguntará acerca de sus síntomas. También le solicitará una muestra de orina. La muestra de orina se analiza para detectar bacterias y glóbulos blancos de la sangre. Los glóbulos blancos se forman en el organismo para ayudar a combatir las infecciones.  °TRATAMIENTO  °Por lo general, las infecciones urinarias pueden tratarse con medicamentos. Debido a que la mayoría de las infecciones son causadas por bacterias, por lo general pueden tratarse con antibióticos. La elección del  antibiótico y la duración del tratamiento dependerá de sus síntomas y el tipo de bacteria causante de la infección.  °INSTRUCCIONES PARA EL CUIDADO EN EL HOGAR  °· Si le recetaron antibióticos, tómelos exactamente como su médico le indique. Termine el medicamento aunque se sienta mejor después de haber tomado sólo algunos. °· Beba gran cantidad de líquido para mantener la orina de tono claro o color amarillo pálido. °· Evite la cafeína, el té y las bebidas gaseosas. Estas sustancias irritan la vejiga. °· Vaciar la vejiga con frecuencia. Evite retener la orina durante largos períodos. °· Vacíe la vejiga antes y después de tener relaciones sexuales. °· Después de mover el intestino, las mujeres deben higienizarse la región perineal desde adelante hacia atrás. Use sólo un papel tissue por vez. °SOLICITE ATENCIÓN MÉDICA SI:  °· Siente dolor en la espalda. °· Le sube la fiebre. °· Los síntomas no mejoran luego de 3 días. °SOLICITE ATENCIÓN MÉDICA DE INMEDIATO SI:  °· Siente dolor intenso en la espalda o en la zona inferior del abdomen. °· Comienza a sentir escalofríos. °· Tiene náuseas o vómitos. °· Tiene una sensación continua de quemazón o molestias al orinar. °ASEGÚRESE DE QUE:  °· Comprende estas instrucciones. °· Controlará su enfermedad. °· Solicitará ayuda de inmediato si no mejora o empeora. °Document Released: 07/21/2005 Document Revised: 07/05/2012 °ExitCare® Patient Information ©2015 [employer]. This information is not intended to replace advice given to you by your health care provider. Make sure you discuss any questions you have with your health care provider. ° °

## 2015-02-22 NOTE — ED Provider Notes (Signed)
CSN: [id number]     Arrival date & time 02/22/15  0159 History  This chart was scribe for Linton Flemings, MD by Judithann Sauger, ED Scribe. The patient was seen in room A01C/A01C and the patient's care was started at 3:22 AM.    Chief Complaint  Patient presents with  . Dysuria   The history is provided by the patient. No language interpreter was used.   HPI Comments: Amy Aguilar is a 39 y.o. female who presents to the Emergency Department complaining of dysuria onset yesterday. She reports associated back pain and chills. She denies any appetite change, fever or vaginal discharge. She reports NKDA.  Patient has had a symptoms in the past.  She is eating and drinking well.  She is status post total hysterectomy  Past Medical History  Diagnosis Date  . Leiomyoma of uterus   . Hypertension     pt states my bp is fine  . Headache     migraines  . Anemia     received blood transfusion 2 wks ago  . Abnormal uterine bleeding (AUB)    Past Surgical History  Procedure Laterality Date  . No past surgeries    . Laparoscopic assisted vaginal hysterectomy N/A 12/05/2014    Procedure: LAPAROSCOPY, ATTEMPTED VAGINAL HYSTERECTOMY, TOTAL ABDOMINAL HYSTERECTOMY;  Surgeon: Margarette Asal, MD;  Location: [facility];  Service: Gynecology;  Laterality: N/A;  . Bilateral salpingectomy Bilateral 12/05/2014    Procedure: BILATERAL SALPINGECTOMY;  Surgeon: Margarette Asal, MD;  [facility]  Service: Gynecology;  Laterality: Bilateral;   Family History  Problem Relation Age of Onset  . Hypertension Father    History  Substance Use Topics  . Smoking status: Never Smoker   . Smokeless tobacco: Never Used  . Alcohol Use: No   OB History    Gravida Para Term Preterm AB TAB SAB Ectopic Multiple Living   4 4 4      1 5      Review of Systems  Constitutional: Positive for chills. Negative for fever and appetite change.  Genitourinary: Positive  for dysuria. Negative for vaginal discharge.  Musculoskeletal: Positive for back pain.      Allergies  Review of patient's allergies indicates no known allergies.  Home Medications   Prior to Admission medications   Medication Sig Start Date End Date Taking? Authorizing Provider  ibuprofen (ADVIL,MOTRIN) 800 MG tablet Take 800 mg by mouth every 8 (eight) hours as needed for moderate pain.    Historical Provider, MD  IRON PO Take 1 tablet by mouth daily.    Historical Provider, MD  oxyCODONE-acetaminophen (PERCOCET/ROXICET) 5-325 MG per tablet Take 1-2 tablets by mouth every 4 (four) hours as needed for severe pain (moderate to severe pain (when tolerating fluids)). 12/08/14   Molli Posey, MD  traMADol (ULTRAM) 50 MG tablet Take 100 mg by mouth every 8 (eight) hours as needed for moderate pain.    Historical Provider, MD   BP 129/88 mmHg  Pulse 91  Temp(Src) 97.6 F (36.4 C) (Oral)  Resp 18  Ht 5\' 3"  (1.6 m)  Wt 180 lb (81.647 kg)  BMI 31.89 kg/m2  SpO2 100%  LMP 11/21/2014 Physical Exam  Constitutional: She is oriented to person, place, and time. She appears well-developed and well-nourished.  HENT:  Head: Normocephalic and atraumatic.  Nose: Nose normal.  Mouth/Throat: Oropharynx is clear and moist.  Eyes: Conjunctivae and EOM are normal. Pupils are equal, round, and reactive to light.  Neck: Normal range of motion. Neck supple. No JVD present. No tracheal deviation present. No thyromegaly present.  Cardiovascular: Normal rate, regular rhythm, normal heart sounds and intact distal pulses.  Exam reveals no gallop and no friction rub.   No murmur heard. Pulmonary/Chest: Effort normal and breath sounds normal. No stridor. No respiratory distress. She has no wheezes. She has no rales. She exhibits no tenderness.  Abdominal: Soft. Bowel sounds are normal. She exhibits no distension and no mass. There is tenderness. There is no rebound and no guarding.  Mild suprapubic  tenderness with palpation  Musculoskeletal: Normal range of motion. She exhibits no edema or tenderness.  Lymphadenopathy:    She has no cervical adenopathy.  Neurological: She is alert and oriented to person, place, and time. She displays normal reflexes. She exhibits normal muscle tone. Coordination normal.  Skin: Skin is warm and dry. No rash noted. No erythema. No pallor.  Psychiatric: She has a normal mood and affect. Her behavior is normal. Judgment and thought content normal.  Nursing note and vitals reviewed.   ED Course  Procedures (including critical care time) DIAGNOSTIC STUDIES: Oxygen Saturation is 100% on RA, normal by my interpretation.    COORDINATION OF CARE: 3:25 AM- Pt advised of plan for treatment and pt agrees.    Labs Review Labs Reviewed  URINALYSIS, ROUTINE W REFLEX MICROSCOPIC - Abnormal; Notable for the following:    Color, Urine AMBER (*)    APPearance TURBID (*)    Hgb urine dipstick LARGE (*)    Protein, ur 30 (*)    Leukocytes, UA MODERATE (*)    All other components within normal limits  URINE MICROSCOPIC-ADD ON - Abnormal; Notable for the following:    Crystals CA OXALATE CRYSTALS (*)    All other components within normal limits    Imaging Review No results found.   EKG Interpretation None      MDM   Final diagnoses:  UTI (lower urinary tract infection)    39 year old female with dysuria starting yesterday.  Urine noted to have signs of UTI.  She does have some calcium oxalate crystals, but does not have unilateral flank pain that seems consistent with nephrolithiasis.  Plan to treat with Bactrim.  She was given precautions for return  I personally performed the services described in this documentation, which was scribed in my presence. The recorded information has been reviewed and is accurate.    Linton Flemings, MD 02/22/15 [phone]

## 2015-02-22 NOTE — ED Notes (Signed)
Pt. reports dysuria " burning" with chills and low abdominal pain onset this afternoon , denies fever or hematuria .

## 2015-08-13 ENCOUNTER — Ambulatory Visit (INDEPENDENT_AMBULATORY_CARE_PROVIDER_SITE_OTHER): Payer: Self-pay | Admitting: Family Medicine

## 2015-08-13 VITALS — BP 120/70 | HR 67 | Temp 98.0°F | Resp 16 | Ht 61.0 in | Wt 182.2 lb

## 2015-08-13 DIAGNOSIS — M791 Myalgia, unspecified site: Secondary | ICD-10-CM

## 2015-08-13 DIAGNOSIS — G4726 Circadian rhythm sleep disorder, shift work type: Secondary | ICD-10-CM

## 2015-08-13 DIAGNOSIS — R5383 Other fatigue: Secondary | ICD-10-CM

## 2015-08-13 MED ORDER — FERROUS SULFATE 325 (65 FE) MG PO TABS: 325.0000 mg | ORAL_TABLET | Freq: Every day | ORAL | Status: DC

## 2015-08-13 MED ORDER — TRAZODONE HCL 50 MG PO TABS: 25.0000 mg | ORAL_TABLET | Freq: Every evening | ORAL | Status: DC | PRN

## 2015-08-13 MED ORDER — RANITIDINE HCL 150 MG PO TABS: 150.0000 mg | ORAL_TABLET | Freq: Two times a day (BID) | ORAL | Status: DC

## 2015-08-13 NOTE — Progress Notes (Signed)
Subjective:  This chart was scribed for  Delman Cheadle MD,  by Tamsen Roers, at [facility].  This patient was seen in room 9 and the patient's care was started at 9:10 AM.    Patient ID: Amy Aguilar, female    DOB: [birth year], 39 y.o.   MRN: [id number] Chief Complaint  Patient presents with  . Fatigue    x 2 days  . muscle hurts    shoulders and arms x 2 days  . Dizziness    x 1 night    HPI  HPI Comments: Amy Aguilar is a 39 y.o. female who presents to the [facility] complaining of fatigue/weakness and muscle pain "all over" onset two days ago. She comes home from work at 8:45 in the morning from the night shift, goes to sleep and wakes up around 12 pm, then goes back to sleep at 6-7 pm till 10:30.  She goes to work at 12:30 am and started having these night shifts two months ago. She is also complaining of dizziness onset 1 night ago when she was working a night shift.  She also has been heart burn as well as indigestion intermittently (since her shifts began) and states that she is unable to eat spicy foods. She has used TUMS for her indigestion.  She states that she used to have energy in the past but these past couple of days have been very difficult for her.  She states that she has these interrupted sleep patterns due to having to wake up to cook and pick up her children.   She is currently drinking 2 coffees at night and tries to drink water as well (but thinks it may not be enough). Patient currently denies any urinary symptoms or difficulty with bowel movements.  She has never used any medication for sleep and does not take any vitamins or supplements currently. Patients last blood work was this year.  Patient does not have any siblings and states she only has her father, who is alive and healthy.  Her mother died when she was a baby and does not remember the cause of her death.  ------ She has a history of iron  deficiency anemia.  8 Months ago, she was at 6.8 and she had a hysterectomy due to this.  Last Hemoglobin was up to 9.4.  Patient had BMP 10 months ago that was normal except for potassium of 3.1.  All prior sugars has been 90-97.  She has not had iron levels checked but anemia was microcytic.      Past Medical History  Diagnosis Date  . Leiomyoma of uterus   . Hypertension     pt states my bp is fine  . Headache     migraines  . Anemia     received blood transfusion 2 wks ago  . Abnormal uterine bleeding (AUB)   . Blood transfusion without reported diagnosis     Current Outpatient Prescriptions on File Prior to Visit  Medication Sig Dispense Refill  . IRON PO Take 1 tablet by mouth daily.    Marland Kitchen oxyCODONE-acetaminophen (PERCOCET/ROXICET) 5-325 MG per tablet Take 1-2 tablets by mouth every 4 (four) hours as needed for severe pain (moderate to severe pain (when tolerating fluids)). (Patient not taking: Reported on 08/13/2015) 30 tablet 0  . traMADol (ULTRAM) 50 MG tablet Take 100 mg by mouth every 8 (eight) hours as needed for moderate pain.     No  current facility-administered medications on file prior to visit.    No Known Allergies   Review of Systems  Constitutional: Positive for fatigue. Negative for fever and chills.  Eyes: Negative for pain, discharge, redness and itching.  Respiratory: Negative for cough, choking and shortness of breath.   Gastrointestinal: Negative for nausea and vomiting.  Genitourinary: Negative for dysuria, urgency, frequency and difficulty urinating.  Musculoskeletal: Positive for myalgias. Negative for neck pain and neck stiffness.  Neurological: Positive for dizziness. Negative for speech difficulty.       Objective:   Physical Exam  Constitutional: She is oriented to person, place, and time. She appears well-developed. No distress.  HENT:  Head: Normocephalic and atraumatic.  Eyes: Pupils are equal, round, and reactive to light.  Neck: No  thyromegaly present.  Cardiovascular: Normal rate, regular rhythm, S1 normal, S2 normal and normal heart sounds.  Exam reveals no gallop and no friction rub.   No murmur heard. Pulmonary/Chest: Effort normal and breath sounds normal. No respiratory distress. She has no wheezes. She has no rales.  Musculoskeletal: Normal range of motion.  Lymphadenopathy:    She has no cervical adenopathy.  Neurological: She is alert and oriented to person, place, and time.  Skin: Skin is warm and dry.  Psychiatric: She has a normal mood and affect. Her behavior is normal.  Nursing note and vitals reviewed.   Filed Vitals:   08/13/15 0826  BP: 120/70  Pulse: 67  Temp: 98 F (36.7 C)  TempSrc: Oral  Resp: 16  Height: 5\' 1"  (1.549 m)  Weight: 182 lb 3.2 oz (82.645 kg)  SpO2: 98%         Assessment & Plan:   1. Other fatigue due to sleeping < 4 hrs at a time since working 3rd shift and young children at home - try to increase amount of sleep at one time but taking trazodone immed after shift ends  2. Sleep disorder, shift work   3. Myalgia - suspect due to chronic fatigue from 3rd shift   Increase K and iron in diet. Start iron supp qd.   Meds ordered this encounter  Medications  . traZODone (DESYREL) 50 MG tablet    Sig: Take 0.5-1 tablets (25-50 mg total) by mouth at bedtime as needed for sleep.    Dispense:  30 tablet    Refill:  3  . ferrous sulfate 325 (65 FE) MG tablet    Sig: Take 1 tablet (325 mg total) by mouth daily with breakfast.    Dispense:  90 tablet    Refill:  3  . ranitidine (ZANTAC) 150 MG tablet    Sig: Take 1 tablet (150 mg total) by mouth 2 (two) times daily.    Dispense:  60 tablet    Refill:  3   I personally performed the services described in this documentation, which was scribed in my presence. The recorded information has been reviewed and considered, and addended by me as needed.  Delman Cheadle, MD MPH

## 2015-08-13 NOTE — Patient Instructions (Signed)
Start an iron supplement once a day - I recommend ferrous sulfate 324mg  (65mg  of elemental iron).  Take this on an empty stomach 30 minutes before eating or 2 hours after a meal.  Take it with a vitamin C supplement or a small glass of orange juice. Take the trazodone as soon as you get off work.  Dieta con alto contenido de hierro (Iron-Rich Diet) El hierro es un mineral que ayuda al organismo a producir hemoglobina. La hemoglobina es una protena de los glbulos rojos que transporta el oxgeno a los tejidos del cuerpo. Consumir muy poco hierro Motorola se sienta dbil y [place], y aumentar su riesgo de contraer infecciones. Consumir la cantidad suficiente de hierro es necesario para el metabolismo del cuerpo, el funcionamiento muscular y el [place]. El hierro es un componente natural de muchos alimentos. Tambin puede agregarse a los alimentos, o bien los alimentos pueden fortificarse con hierro. Hay dos tipos de hierro de los alimentos:  Hierro hemo. El organismo absorbe el hierro hemo con mayor facilidad que el no hemo. La carne de res, de ave y de pescado contienen hierro hemo.  Hierro no hemo. Se encuentra en los complementos alimenticios, los cereales fortificados con hierro, los frijoles y las verduras. Es posible que deba seguir una dieta con alto contenido de hierro en los siguientes casos:  Si le han diagnosticado una deficiencia de hierro o anemia por deficiencia de hierro.  Si tiene una enfermedad que le impide absorber el hierro de los alimentos, por ejemplo:  Infecciones intestinales.  Enfermedad celaca. Esto incluye la inflamacin permanente (crnica) de los intestinos.  No consume suficiente hierro.  Consume una dieta que incluye muchos alimentos que afectan la absorcin de hierro.  Ha perdido [employer].  Tiene sangrado abundante cuando [place].  Est embarazada. EN QU CONSISTE EL PLAN? El mdico puede ayudarlo a Office manager cantidad de hierro  que necesita a diario en funcin de su cuadro clnico. Por lo general, Laroy Apple persona consume cantidades suficientes de hierro en la dieta, se satisfacen las siguientes necesidades de hierro:  Hombres.  De 14 a 18aos: 11mg  al da.  De 19 a 50aos: 8mg  al Training and development officer.  Mujeres.   De 14 a 18aos: 15mg  al Training and development officer.  De 19 a 50aos: 18mg  al da.  Mayores de 50aos: 8mg  al da.  Embarazadas: 27mg  al da.  Mujeres en perodo de lactancia: 9mg  al da. QU DEBO SABER ACERCA DE LA DIETA CON ALTO CONTENIDO DE HIERRO?  Consuma frutas y verduras frescas con alto contenido de vitaminaC junto con alimentos ricos en hierro. Esto ayudar a aumentar la cantidad de hierro que el cuerpo absorbe de los alimentos, en especial, con los que contienen hierro no hemo. Entre los alimentos con alto contenido de vitaminaC se incluyen las [place], los pimientos morrones, los tomates y Education officer, environmental.  Tome los suplementos de hierro solamente como se lo haya indicado el mdico. La sobredosis de hierro puede ser potencialmente mortal. Si le recetan suplementos de hierro, tmelos con jugo de naranja o un suplemento de vitaminaC.  Cocine los alimentos en ollas de hierro.   Consuma alimentos que contengan hierro no hemo junto con aquellos con alto contenido de hierro hemo. Esto ayuda a mejorar la absorcin de hierro.   Determinados alimentos y ciertas bebidas contienen compuestos que afectan la absorcin de hierro. No consuma estos alimentos en la misma comida que aquellos con alto contenido de hierro o con suplementos de Sport and exercise psychologist. Estos incluyen los siguientes:  Caf, t negro y vino tinto.  Leche, productos lcteos y alimentos con alto contenido de calcio.  Frijoles, porotos de soja y [place].  Cereales integrales.  Cuando consuma alimentos que contengan hierro no hemo y compuestos que afecten la absorcin de hierro, siga estos consejos para mejorar la absorcin de hierro.   Antes de cocinarlos,  remoje los frijoles durante la noche.  Antes de usarlos, remoje los cereales integrales durante la noche y culelos.  Prepare un fermento con las harinas antes del horneado, como si usara levadura en la masa del pan. QU ALIMENTOS PUEDO COMER? Cereales Cereales para el desayuno fortificados con hierro. Pan de trigo integral fortificado con hierro. Arroz enriquecido. Granos germinados. Verduras [place]. Papas con cscara. Guisantes. Brcoli. Pimientos morrones rojos y verdes. Verduras fermentadas. Frutas Ciruelas pasas. Pasas. Naranjas. Hughie Closs. Mango. Pomelo. Carnes y otras fuentes de protenas Hgado de res. Ostras. Carne de vaca. Camarones. Pavo. Pollo. Atn. Sardinas. Garbanzos. Nueces. Tofu. Bebidas Jugo de tomate. Jugo de naranja recin exprimido. Jugo de ciruelas. T de hibisco. Batidos instantneos fortificados para el desayuno. Condimentos Tahini. Salsa de soja fermentada. Dulces y Contractor.  Otros Germen de trigo. Los artculos mencionados arriba pueden no ser [employer] de las bebidas o los alimentos recomendados. Comunquese con el nutricionista para conocer ms opciones. QU ALIMENTOS NO SE RECOMIENDAN? Cereales Cereales integrales. Cereal de salvado. Harina de salvado. Avena. Verduras [place]. Repollitos de Bruselas. Col rizada. Lambert Mody Arndanos. Oletha Blend. Hughie Closs. Higos. Carnes y otras fuentes de protenas Soja. Productos elaborados a base de protena de la soja. Lcteos Leche. Crema. Queso. Yogur. [employer]. Bebidas Caf. T negro. Vino tinto. Dulces y [employer]. Chocolate. Helados. Otros [place]. Organo. Perejil. Los artculos mencionados arriba pueden no ser [employer] Foods Company de las bebidas y los alimentos que se Higher education careers adviser. Comunquese con el nutricionista para obtener ms informacin.   Esta informacin no tiene Marine scientist el consejo del mdico. Asegrese de hacerle al mdico cualquier  pregunta que tenga.   Document Released: 07/28/2006 Document Revised: 11/01/2014 Elsevier Interactive Patient Education 2016 [place] (Fatigue) La fatiga es una sensacin de cansancio en todo momento, falta de energa o falta de motivacin. La fatiga ocasional o leve con frecuencia es una reaccin normal a la actividad o la vida en general. Sin embargo, la fatiga de Engineer, site duracin (crnica) o extrema puede indicar una enfermedad preexistente. INSTRUCCIONES PARA EL CUIDADO EN EL HOGAR  Controle su fatiga para ver si hay cambios. Las siguientes indicaciones ayudarn a Writer Ryder System pueda sentir:  Hable con el mdico acerca de cunto debe dormir cada noche. Trate de dormir la cantidad de tiempo requerida todas las noches.  Tome los medicamentos solamente como se lo haya indicado el mdico.  Siga una dieta saludable y nutritiva. Pida ayuda al mdico si necesita hacer cambios en su dieta.  Beba suficiente lquido para Consulting civil engineer orina clara o de color amarillo plido.  Practique actividades que lo relajen, como yoga, meditacin, terapia de [place] o acupuntura.  Haga ejercicios regularmente.  Cambie las situaciones que le provocan estrs. Trate de que su [place] y personal sea moderada.  No consuma drogas.  Limite el consumo de alcohol a no ms de 1 medida por da si es mujer y no est Music therapist, y 2 medidas si es hombre. Una medida equivale a 12onzas de cerveza, 5onzas de vino o 1onzas de bebidas alcohlicas de alta graduacin.  Tome una multivitamina, si se lo indic el mdico.  SOLICITE ATENCIN MDICA SI:   La fatiga no mejora.  Tiene fiebre.  Tiene prdida o aumento involuntario de [place].  Tiene dolores de [place].  Tiene dificultad para:  Dormirse.  Dormir durante toda la noche.  Se siente enojado, culpable, ansioso o triste.  No puede defecar (estreimiento).  Tiene la piel seca.  Tiene hinchadas las piernas u otra parte  del cuerpo. SOLICITE ATENCIN MDICA DE INMEDIATO SI:   Se siente confundido.  Tiene visin borrosa.  Sufre mareos o se desmaya.  Sufre un dolor intenso de [place].  Siente dolor intenso en el abdomen, la pelvis o la espalda.  Tiene dolor de pecho, dificultad para respirar, o latidos cardacos irregulares o acelerados.  No puede orinar u [employer] Foods de lo normal.  Presenta sangrado anormal, como sangrado del recto, la vagina, la nariz, los pulmones o los pezones.  Vomita sangre.  Tiene pensamientos acerca de hacerse dao a s mismo o cometer suicidio.  Le preocupa la posibilidad de hacerle dao a otra persona.   Esta informacin no tiene Marine scientist el consejo del mdico. Asegrese de hacerle al mdico cualquier pregunta que tenga.   Document Released: 01/27/2009 Document Revised: 11/01/2014 Elsevier Interactive Patient Education 2016 [place] potasio de los alimentos (Potassium Content of Foods) El potasio es un mineral que se encuentra en muchos alimentos y bebidas. Ayuda a Contractor equilibrio de lquidos y [employer] organismo, e influye en la regularidad con la que el corazn late. El potasio tambin ayuda a Chief Technology Officer la presin arterial y a Electrical engineer y [place]. Algunos medicamentos y afecciones pueden cambiar el equilibrio de potasio en el cuerpo. Cuando esto sucede, puede ayudar a Cisco de potasio a travs de los alimentos que consume. El mdico o el nutricionista le recomendarn la cantidad de potasio que debe ingerir por Training and development officer. Las siguientes listas proporcionan la cantidad de potasio (entre parntesis) por porcin en cada alimento. CON ALTO CONTENIDO DE POTASIO  Los siguientes alimentos y bebidas tienen 200 mg o ms de potasio por porcin:  Damascos, 2 crudos o 5 secos (200 mg)  [place]cachofa, 1 mediana (345 mg)  Aguacate, crudo, 1/4 (245 mg)  Banana, 1 mediana (425 mg)  Frijoles, lima o  frijoles en salsa de tomates, enlatados, 1/2 taza (280 mg)  Frijoles blancos, enlatados, 1/2 taza (595 mg)  Carne asada, 3 onzas/85 g (320 mg)  Carne molida, 3 onzas/85 g (270 mg)  Remolachas, crudas o cocidas, 1/2 taza (260 mg)  Bollitos de salvado, 2 onzas/57 g (300 mg)  Brcoli, 1/2 taza (230 mg)  Repollitos de Bruselas, 1/2 taza (250 mg)  Meln, 1/2 taza (215 mg)  Cereales, 100 % de salvado, 1/2 taza (200 a 400 mg)  [place]amb[place]uesa de queso, sola, comida rpida, 1 (225 a 400 mg)  Pollo, 3 onzas/85 g (220 mg)  Almejas, enlatadas, 3 onzas/85 g (535 mg)  Cangrejo, 3 onzas/85 g (225 mg)  Dtiles, 5 (270 mg)  Frijoles y guisantes secos, 1/2 taza (300 a 475 mg)  Higos, secos, 2 (260 mg)  Pescado: halibut, atn, bacalao, pargo, 3 onzas/85 g (480 mg)  Pescado: salmn, abadejo, pez espada, perca, 3 onzas/85 g (300 mg)  Pescado: atn, enlatado, 3 onzas/85 g (200 mg)  Papas fritas, comida rpida, 3 onzas/85 g (470 mg)  Granola con frutas y frutos secos, 1/2 taza (200 mg)  Jugo de pomelo, 1/2 taza (200 mg)  Hojas verdes de Teacher, English as a foreign language, 1/2 taza (655  mg)  Meln dulce, 1/2 taza (200 mg)  Col rizada, 1 taza (300 mg)  Kiwi, 1 mediano (240 mg)  Colirrbano, colinabo, chiriva, 1/2 taza (280 mg)  Lentejas, 1/2 taza (365 mg)  Mango, 1 (325 mg)  Leche con chocolate, 1 taza (420 mg)  Leche: descremada, parcialmente descremada, entera, suero de [place], 1 taza (350 a 380 mg)  Melaza, 1 cucharada (295 mg)  Championes, 1/2 taza (280 mg)  Nectarina, 1 (275 mg)  Frutos secos: almendras, manes, avellanas, nueces de [place], castaas de caj, mezclados, 1 onza/28 g (200 mg)  Frutos secos: pistachos, 1 onza/28 g (295 mg)  Naranja, 1 (240 mg)  Jugo de naranja, 1/2 taza (235 mg)  Papaya, mediana, 1/2 fruta (390 mg)  Quartzsite de man, con trozos, 2 cucharadas (240 mg)  [place] de man, sin trozos, 2 cucharadas (210 mg)  Pera, 1 mediana (200 mg)  Granada, 1  entera (400 mg)  Jugo de granada, 1/2 taza (215 mg)  Cerdo, 3 onzas/85 g (350 mg)  Papas fritas (de bolsa), 1 onza/85 g (465 mg)  Papa, asada, con piel, 1 mediana (925 mg)  Papas, hervidas, 1/2 taza (255 mg)  Papas, pur, 1/2 taza (330 mg)  Jugo de ciruela, 1/2 taza (370 mg)  Ciruelas, 5 (305 mg)  Pudin de chocolate, 1/2 taza (230 mg)  [place]ab[place], enlatada, 1/2 taza (250 mg)  Pasas de uva, sin semilla, 1/4 taza (270 mg)  Semillas de girasol o calabaza, 1 onza/28 g (240 mg)  Leche de soja, 1 taza (300 mg)  Espinaca, 1/2 taza (420 mg)  Espinaca, enlatada, 1/2 taza (370 mg)  Batata, asada, con piel, 1 mediana (450 mg)  Acelga suiza, 1/2 taza (480 mg)  Jugo de tomate o verduras, 1/2 taza (275 mg)  Salsa o pur de tomates, 1/2 taza (400 a 550 mg)  Tomate, crudo, 1 mediano (290 mg)  Tomates, enlatados, 1/2 taza (200 a 300 mg)  Pavo, 3 onzas/85 g (250 mg)  Germen de trigo, 1 onza/28 g (250 mg)  [place]abaza de invierno, 1/2 taza (250 mg)  Yogur, comn o con frutas, 6 onzas/177 ml (260 a 435 mg)  [place]acn, 1/2 taza (220 mg) CON CONTENIDO MODERADO DE POTASIO Los siguientes alimentos y bebidas tienen entre 50 y 200 mg de potasio por porcin:  Manzana, 1 (150 mg)  Jugo de Klondike, 1/2 taza (150 mg)  Pur de Archivist, 1/2 taza (90 mg)  Nctar de damasco, 1/2 taza (140 mg)  Esprragos, pequeos, 1/2 taza o 6 esprragos (155 mg)  Bagel con canela y pasas de uva, 1 (130 mg)  Bagel con huevo o comn, 4 pulgada/10 cm, 1 (70 mg)  Frijoles verdes, 1/2 taza (90 mg)  Frijoles amarillos, 1/2 taza (190 mg)  Cerveza, regular, 12 onzas/355 ml (100 mg)  Remolachas, enlatadas, 1/2 taza (125 mg)  Moras, 1/2 taza (115 mg)  Arndanos, 1/2 taza (60 mg)  Pan integral, 1 rebanada (70 mg)  Brcoli, crudo, 1/2 taza (145 mg)  Repollo, 1/2 taza (150 mg)  Zanahorias, crudas o cocidas, 1/2 taza (180 mg)  Coliflor, cruda, 1/2 taza (150 mg)  Apio, crudo, 1/2 taza (155  mg)  Cereales, copos de salvado, 1/2 taza (120 a 150 mg)  Requesn, 1/2 taza (110 mg)  Cerezas, 10 (150 mg)  Chocolate, barra de 1 1/2 onza/43 g (165 mg)  Caf, de cafetera, 6 onzas/177 ml (90 mg)  Maz, 1/2 taza o 1 espiga (195 mg)  Pepinos, 1/2 taza (80 mg)  Huevo,  grande, 1 (60 mg)  Berenjena, 1/2 taza (60 mg)  Endivia, cruda, 1/2 taza (80 mg)  Muffin ingls, 1 (65 mg)  Pescado: reloj anaranjado, 3 onzas/85 g (150 mg)  Salchicha de [place], [place], 1 (75 mg)  Cctel de frutas, 1/2 taza (115 mg)  Jugo de uvas, 1/2 taza (170 mg)  Pomelo, 1/2 (175 mg)  Uvas, 1/2 taza (155 mg)  Hojas verdes: col rizada, nabo, col, 1/2 taza (110 a 150 mg)  Helado o yogur helado, de chocolate, 1/2 taza (175 mg)  Helado o yogur helado, de vainilla, 1/2 taza (120 a 150 mg)  Limones, limas, 1 (80 mg)  Lechuga, todos los tipos, 1 taza (100 mg)  [place], 1/2 taza (150 mg)  Championes, crudos, 1/2 taza (110 mg)  Frutos secos: nueces, pacanas o macadamia, 1 onza/28 g (125mg )  Avena, 1/2 taza (80 mg)  [place], 1/2 taza (110 mg)  Cebolla, cruda, 1/2 taza (120 mg)  Durazno, 1 (185 mg)  Duraznos, enlatados, 1/2 taza (120 mg)  Peras, enlatadas, 1/2 taza (120 mg)  Guisantes, congelados, 1/2 taza (90 mg)  Pimientos, verdes, 1/2 taza (130 mg)  Pimientos, rojos, 1/2 taza (160 mg)  Jugo de pia, 1/2 taza (165 mg)  Pia, fresca o enlatada, 1/2 taza (100 mg)  Ciruelas, 1 (105 mg)  Pudin de vainilla, 1/2 taza (150 mg)  Frambuesas, 1/2 taza (90 mg)  Ruibardo, 1/2 taza (115 mg)  Arroz salvaje, 1/2 taza (80 mg)  Camarones, 3 onzas/85 g (155 mg)  Espinaca, cruda, 1 taza (170 mg)  Fresas, 1/2 taza (125 mg)  Calabaza de verano, 1/2 taza (175 a 200 mg)  Acelga suiza, cruda, 1 taza (135 mg)  Mandarina, 1 (140 mg)  T, 6 onzas/177 ml (65 mg)  Nabos, 1/2 taza (140 mg)  Sanda, 1/2 taza (85 mg)  Vino tinto, de mesa, 5 onzas/148 ml (180  mg)  Vino blanco, de mesa, 5 onzas/148 ml (100 mg) CON BAJO CONTENIDO DE POTASIO Los siguientes alimentos y bebidas tienen menos de 50 mg de potasio por porcin:  Pan blanco, 1 rebanada (30 g)  Bebidas gaseosas, 12 onzas/355 ml (menos de 5 mg)  Queso, 1 onza/28 g (20 a 30 mg)  Arndanos rojos, 1/2 taza (45 mg)  Cctel con jugo de arndanos rojos, 1/2 taza (20 mg)  Grasas y aceites, 1 cucharada (menos de 5 mg)  Hummus, 1 cucharada (32 mg)  Nctar: papaya, mango o pera, 1/2 taza (35 mg)  Arroz blanco o integral, 1/2 taza (50 mg)  Espaguetis o macarrones, cocidos, 1/2 taza (30 mg)  Tortilla de harina o maz, 1 (50 mg)  Waffle, 4 pulgadas/10 cm, 1 (50 mg)  Castaas de agua, 1/2 taza (40 mg)   Esta informacin no tiene Marine scientist el consejo del mdico. Asegrese de hacerle al mdico cualquier pregunta que tenga.   Document Released: 07/05/2012 Document Revised: 10/16/2013 Elsevier Interactive Patient Education [employer].

## 2015-11-21 IMAGING — US US PELVIS COMPLETE
1 series · 13 of 25 positions shown · non-contrast
Comparison: None

CLINICAL DATA: Vaginal bleeding, menorrhagia, irregular menses. The
patient reports a possible recent endometrial biopsy.

EXAM:
TRANSABDOMINAL AND TRANSVAGINAL ULTRASOUND OF PELVIS
TECHNIQUE: Both transabdominal and transvaginal ultrasound examinations of the
pelvis were performed. Transabdominal technique was performed for
global imaging of the pelvis including uterus, ovaries, adnexal
regions, and pelvic cul-de-sac. It was necessary to proceed with
endovaginal exam following the transabdominal exam to visualize the
endometrium and ovaries.

[Series 1: us pelvis complete · 0.24mm/px · 13 of 92 slices shown]
[im 1/92]
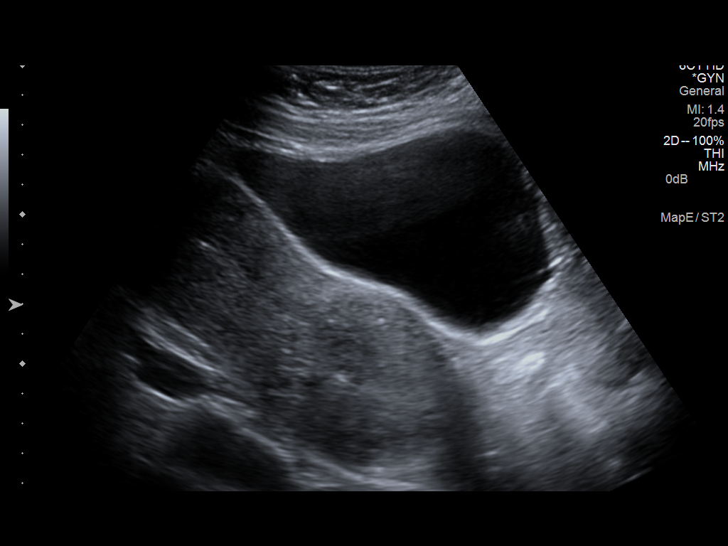
[im 8/92]
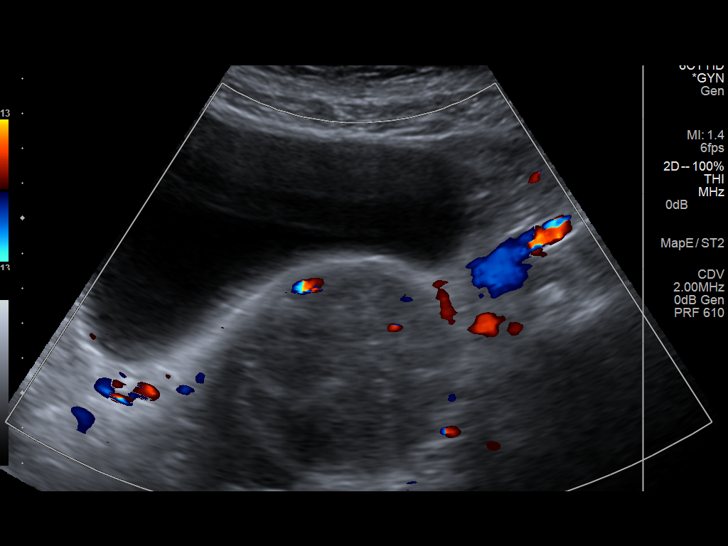
[im 16/92]
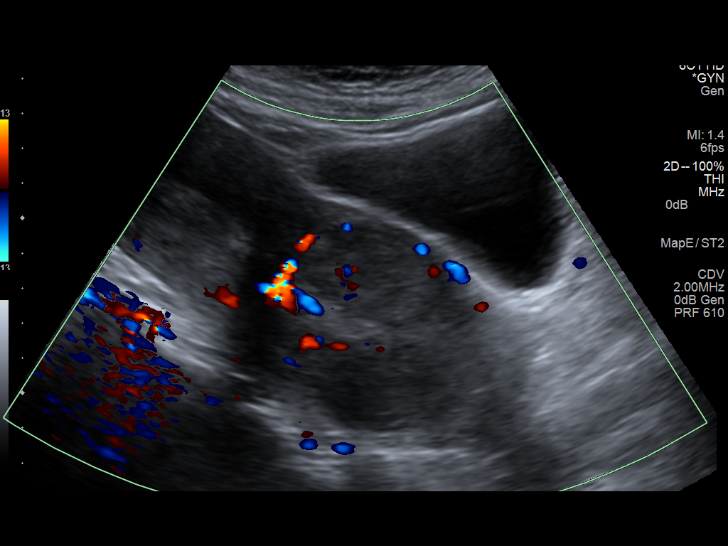
[im 23/92]
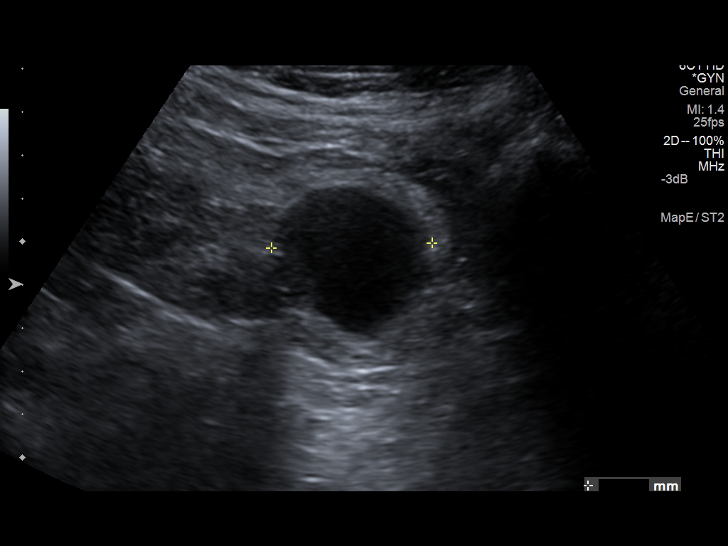
[im 31/92]
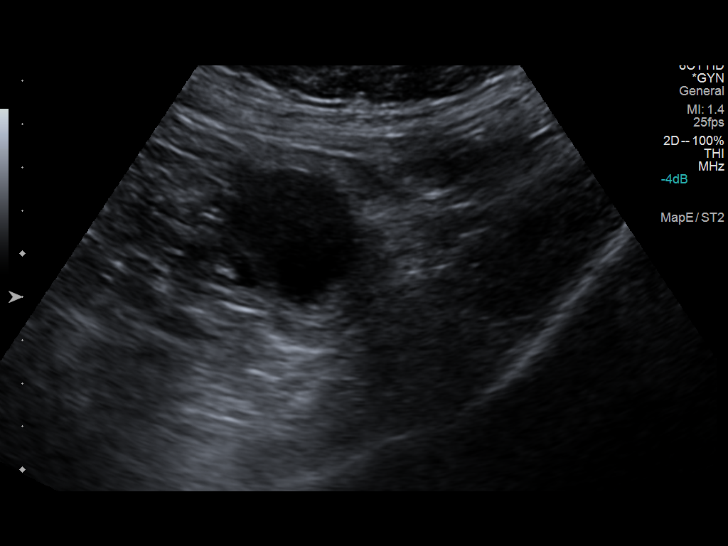
[im 38/92]
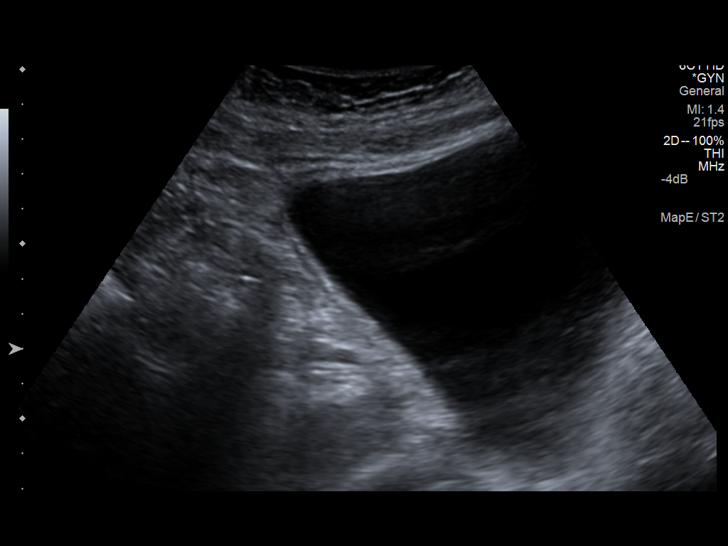
[im 46/92]
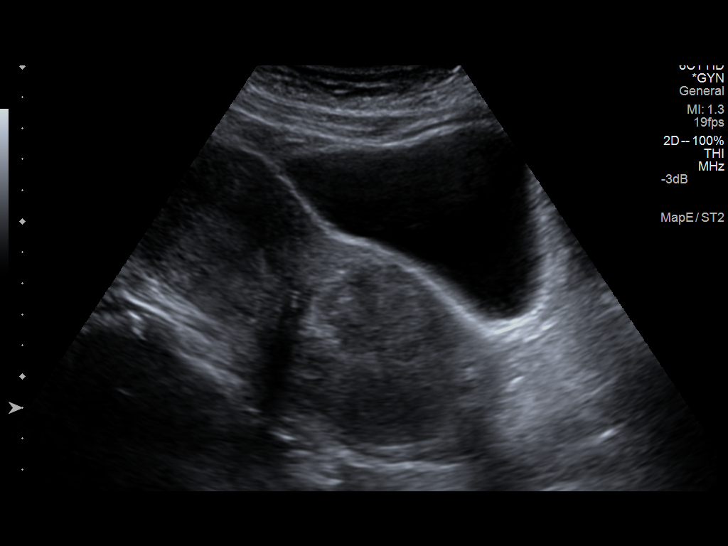
[im 54/92]
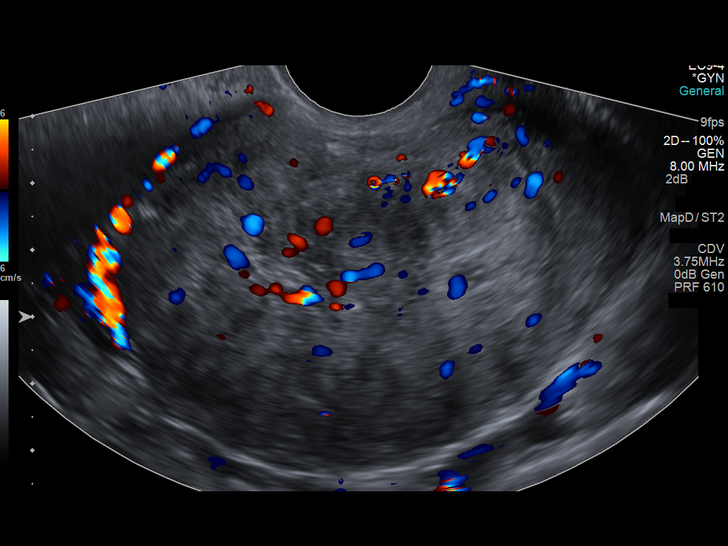
[im 61/92]
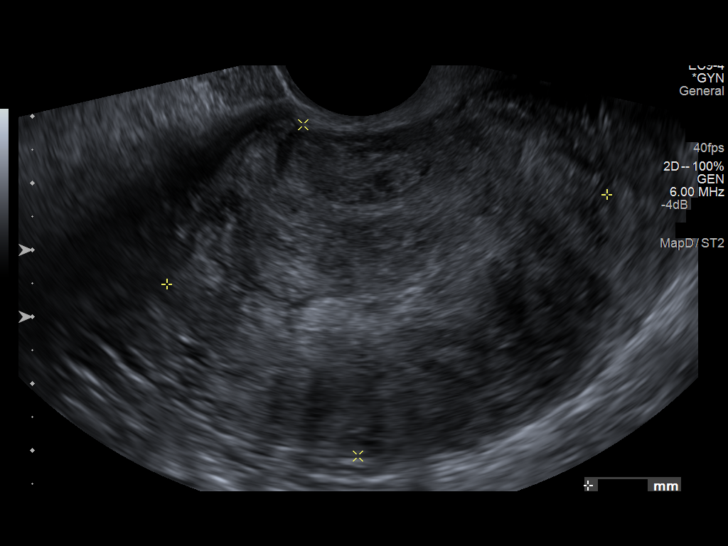
[im 69/92]
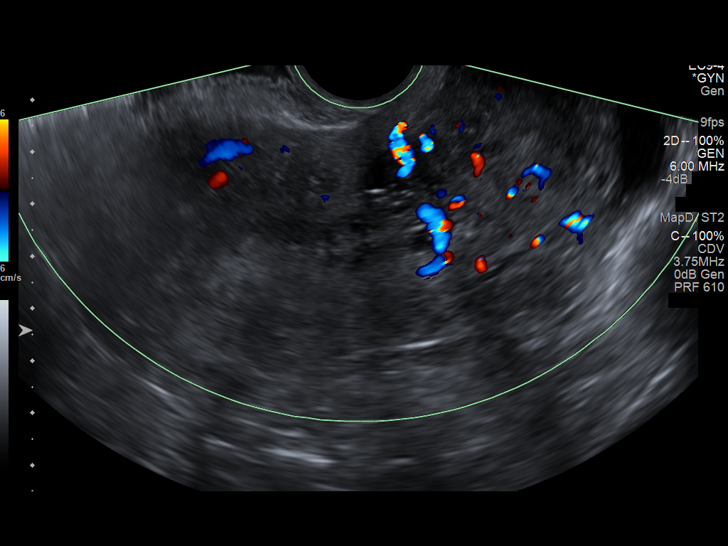
[im 76/92]
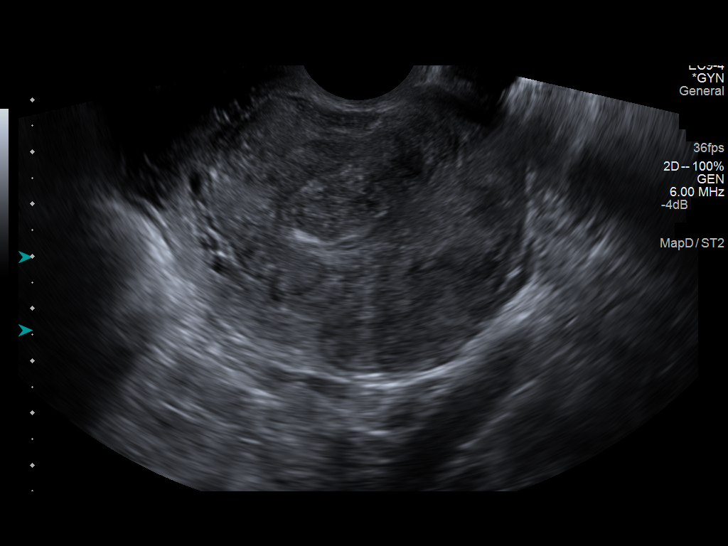
[im 84/92]
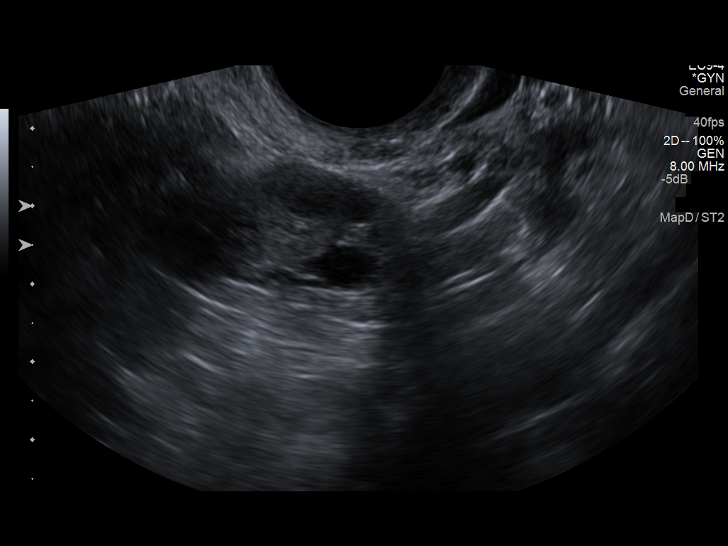
[im 92/92]
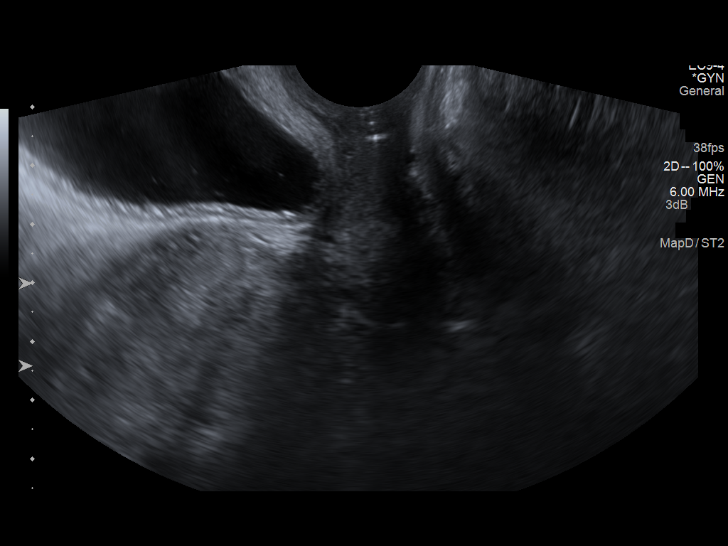

[13 of 25 positions shown; findings below may reference images not displayed]

FINDINGS: Uterus

Measurements: 13.4 x 7.6 by 5.0 cm. There is fusiform enlargement of
the lower uterine segment which is ill-defined and not discretely
measurable.

Endometrium

Thickness: 11 mm.  No focal abnormality visualized.

Right ovary

Measurements: 3.5 x 1.8 x 1.8 cm. Normal appearance/no adnexal mass.

Left ovary

Measurements: 4.6 x 3.5 x 3.5 cm. Normal appearance/no adnexal mass.

Other findings

No free fluid.
IMPRESSION: Fusiform masslike enlargement of the lower uterine segment which
could represent a fibroid although cervical neoplasia could appear
similar. Correlation with Pap smear results is recommended, and
pelvic MRI with contrast could be helpful for better anatomic
evaluation of this possible fibroid and to determine possible
treatment options.

## 2016-04-06 ENCOUNTER — Encounter: Payer: Self-pay | Admitting: Pediatric Intensive Care

## 2016-04-06 DIAGNOSIS — N6310 Unspecified lump in the right breast, unspecified quadrant: Secondary | ICD-10-CM

## 2016-04-06 NOTE — Congregational Nurse Program (Signed)
Congregational Nurse Program Note  Date of Encounter: 04/06/2016  Past Medical History: Past Medical History  Diagnosis Date  . Leiomyoma of uterus   . Hypertension     pt states my bp is fine  . Headache     migraines  . Anemia     received blood transfusion 2 wks ago  . Abnormal uterine bleeding (AUB)   . Blood transfusion without reported diagnosis     Encounter Details:     CNP Questionnaire - 04/06/16 1504    Patient Demographics   Is this a new or existing patient? New   Patient is considered a/an Immigrant   Race Latino/Hispanic   Patient Assistance   Location of Patient Assistance [place]   Patient's financial/insurance status Self-Pay   Uninsured Patient Yes   Interventions Counseled to make appt. with provider  CN will assist client in finding medical home.   Patient referred to apply for the following financial assistance [employer] insecurities addressed Not Applicable   Transportation assistance No   Assistance securing medications No   Educational health offerings Not Applicable   Encounter Details   Primary purpose of visit Acute Illness/Condition Visit;Navigating the Healthcare System   Was an Emergency Department visit averted? Not Applicable   Does patient have a medical provider? No   Patient referred to Clinic   Was a mental health screening completed? (GAINS tool) No   Does patient have dental issues? No   Does patient have vision issues? No   Does your patient have an abnormal blood pressure today? No   Since previous encounter, have you referred patient for abnormal blood pressure that resulted in a new diagnosis or medication change? No   Does your patient have an abnormal blood glucose today? No   Since previous encounter, have you referred patient for abnormal blood glucose that resulted in a new diagnosis or medication change? No   Was there a life-saving intervention made? No    S:Client states that she found a  lump in her right breast. Client had TAH over a year ago. N family history of breast cancer- mother died at aged 74 due to tuberculosis. Client states that it has been over 3 years since she had a mammogram. Reports history of lump in left breast a few years ago but that it was an infection. Client has no health insurance at present. O:CN palpated lump in right breast near axilla. Lump is approximately 3cm X1cm. No erythema noted but client states that her breast is sore. A: Right breast lump P: Client given [employer] application and information was reviewed via FAI rep. CN will make referral to [facility] for appointment for evaluation and contact client with appointment time.

## 2016-04-07 ENCOUNTER — Encounter: Payer: Self-pay | Admitting: *Deleted

## 2016-04-14 ENCOUNTER — Other Ambulatory Visit: Payer: Self-pay | Admitting: Obstetrics and Gynecology

## 2016-04-14 ENCOUNTER — Telehealth: Payer: Self-pay | Admitting: General Practice

## 2016-04-14 DIAGNOSIS — N63 Unspecified lump in unspecified breast: Secondary | ICD-10-CM

## 2016-04-14 DIAGNOSIS — N6311 Unspecified lump in the right breast, upper outer quadrant: Secondary | ICD-10-CM

## 2016-04-14 NOTE — Telephone Encounter (Signed)
Patient needs appt with breast center for lump in right breast prior to her appt in the clinic on 7/3. Scheduled 6/27 @ 1pm. Called patient with Manuela Schwartz for interpreter & informed patient of mammogram appt & reminded her of appt in clinic. Patient verbalized understanding to all & had no questions

## 2016-04-20 ENCOUNTER — Other Ambulatory Visit: Payer: BLUE CROSS/BLUE SHIELD

## 2016-04-20 ENCOUNTER — Telehealth (HOSPITAL_COMMUNITY): Payer: Self-pay | Admitting: *Deleted

## 2016-04-20 NOTE — Telephone Encounter (Signed)
Telephoned patient at home # and left message to return call to [facility]. Used interpreter Marly Adams.  

## 2016-04-26 ENCOUNTER — Other Ambulatory Visit (HOSPITAL_COMMUNITY): Payer: Self-pay | Admitting: *Deleted

## 2016-04-26 ENCOUNTER — Encounter: Payer: Self-pay | Admitting: Obstetrics and Gynecology

## 2016-04-26 DIAGNOSIS — N6310 Unspecified lump in the right breast, unspecified quadrant: Secondary | ICD-10-CM

## 2016-05-05 ENCOUNTER — Telehealth (HOSPITAL_COMMUNITY): Payer: Self-pay | Admitting: *Deleted

## 2016-05-05 NOTE — Telephone Encounter (Signed)
Telephoned patient at home # and left message about [facility] appointment on Thursday July 13 9:00

## 2016-05-06 ENCOUNTER — Ambulatory Visit (HOSPITAL_COMMUNITY): Payer: Self-pay

## 2016-05-06 ENCOUNTER — Other Ambulatory Visit: Payer: BLUE CROSS/BLUE SHIELD

## 2016-05-06 ENCOUNTER — Inpatient Hospital Stay: Admission: RE | Admit: 2016-05-06 | Payer: BLUE CROSS/BLUE SHIELD | Source: Ambulatory Visit

## 2016-05-07 ENCOUNTER — Encounter (HOSPITAL_COMMUNITY): Payer: Self-pay | Admitting: *Deleted

## 2016-05-07 ENCOUNTER — Emergency Department (HOSPITAL_COMMUNITY)
Admission: EM | Admit: 2016-05-07 | Discharge: 2016-05-07 | Disposition: A | Discharge status: Home / Self Care | Payer: BLUE CROSS/BLUE SHIELD | Attending: Emergency Medicine | Admitting: Emergency Medicine

## 2016-05-07 DIAGNOSIS — M25432 Effusion, left wrist: Secondary | ICD-10-CM | POA: Insufficient documentation

## 2016-05-07 DIAGNOSIS — M779 Enthesopathy, unspecified: Secondary | ICD-10-CM

## 2016-05-07 DIAGNOSIS — M778 Other enthesopathies, not elsewhere classified: Secondary | ICD-10-CM | POA: Insufficient documentation

## 2016-05-07 MED ORDER — KETOROLAC TROMETHAMINE 30 MG/ML IJ SOLN
30.0000 mg | Freq: Once | INTRAMUSCULAR | Status: AC
  Administered 2016-05-07: 30 mg via INTRAMUSCULAR
  Filled 2016-05-07: qty 1

## 2016-05-07 NOTE — Discharge Instructions (Signed)
Continue taking your ibuprofen as prescribed. Use attached reference guide for symptom support. Return to ED for new or worsening symptoms as we discussed.

## 2016-05-07 NOTE — ED Notes (Signed)
Pt is here with bilateral hand and wrist swelling since yesterday.  Pt does a lot of work with her hands

## 2016-05-07 NOTE — ED Notes (Signed)
PA-C to see and assess patient before RN assessment. See PA note. 

## 2016-05-07 NOTE — ED Provider Notes (Signed)
CSN: [id number]     Arrival date & time 05/07/16  1821 History  By signing my name below, I, Georgette Shell, attest that this documentation has been prepared under the direction and in the presence of [employer], PA-C. Electronically Signed: Georgette Shell, ED Scribe. 05/07/2016. 9:14 PM.     Chief Complaint  Patient presents with  . Joint Swelling   The history is provided by the patient. No language interpreter was used.   HPI Comments: Amy Aguilar is a 40 y.o. female who presents to the Emergency Department complaining of gradual onset, constant, bilateral hand, forearm and wrist swelling onset one day ago. Pt describes it as a burning sensation with mild numbness. Pt notes she does a lot of work with her hands. 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 She reports pain is exacerbated when she extends her fingers. Per pt, she was seen at an urgent care and had an x-ray done and was prescribed Ibuprofen with no relief. Pt states she went to the ED because she wanted a second opinion. No alleviating factors noted. Pt denies fever, chills, abdominal pain, nausea or vomiting.   Past Medical History  Diagnosis Date  . Uterine fibroid    Past Surgical History  Procedure Laterality Date  . Abdominal hysterectomy     No family history on file. Social History  Substance Use Topics  . Smoking status: Never Smoker   . Smokeless tobacco: None  . Alcohol Use: No   OB History    No data available     Review of Systems A complete 10 system review of systems was obtained and all systems are negative except as noted in the HPI and PMH.   Allergies  Review of patient's allergies indicates no known allergies.  Home Medications   Prior to Admission medications   Medication Sig Start Date End Date Taking? Authorizing Provider  ibuprofen (ADVIL,MOTRIN) 800 MG tablet Take 1 tablet (800 mg total) by mouth 3 (three) times daily. 10/13/14   Jennifer  Piepenbrink, PA-C  medroxyPROGESTERone (PROVERA) 10 MG tablet Take 1 tablet (10 mg total) by mouth daily. 10/13/14   Jennifer Piepenbrink, PA-C   BP 138/90 mmHg  Pulse 67  Temp(Src) 98.6 F (37 C)  Resp 16  Wt 194 lb 1 oz (88.026 kg)  SpO2 99%  LMP 10/13/2014 Physical Exam  Constitutional: She appears well-developed and well-nourished.  Awake, alert, nontoxic appearance.  HENT:  Head: Normocephalic and atraumatic.  Eyes: Conjunctivae are normal. Right eye exhibits no discharge. Left eye exhibits no discharge.  Neck: Neck supple.  Cardiovascular: Normal rate.   Pulmonary/Chest: Effort normal. No respiratory distress. She exhibits no tenderness.  Abdominal: Soft. She exhibits no distension. There is no tenderness. There is no rebound.  Musculoskeletal: Normal range of motion. She exhibits no tenderness.  Baseline ROM, no obvious new focal weakness. Discomfort noted to bilateral thenar eminence. Diffusely throughout forearms. Muscle compartments are soft. Distal pulses intact with brisk cap refill. Motor strength and sensation are baseline. Negative Tinel's.  Neurological: She is alert.  Mental status and motor strength appears baseline for patient and situation.  Skin: Skin is warm and dry. No rash noted.  Psychiatric: She has a normal mood and affect. Her behavior is normal.  Nursing note and vitals reviewed.   ED Course  Procedures  DIAGNOSTIC STUDIES: Oxygen Saturation is 95% on RA, poor by my interpretation.    COORDINATION OF CARE: 9:14 PM  Discussed treatment plan with pt at bedside which includes antiinflammatory Rx and pt agreed to plan.  MDM  Presentation consistent with tendinitis. NVI. Recommended OTC anti-inflammatories, rest. No evidence of other acute or emergent pathology. Discussed strict return precautions. Final diagnoses:  Tendonitis    I personally performed the services described in this documentation, which was scribed in my presence. The recorded  information has been reviewed and is accurate.    Comer Locket, PA-C 05/07/16 2258  Milton Ferguson, MD 05/07/16 [phone]

## 2016-05-07 NOTE — ED Notes (Signed)
Patient verbalized understanding of discharge instructions and denies any further needs or questions at this time. VS stable. Patient ambulatory with steady gait.  

## 2016-07-21 DIAGNOSIS — R52 Pain, unspecified: Secondary | ICD-10-CM | POA: Insufficient documentation

## 2016-09-14 ENCOUNTER — Encounter (INDEPENDENT_AMBULATORY_CARE_PROVIDER_SITE_OTHER): Payer: Self-pay | Admitting: Orthopaedic Surgery

## 2016-09-14 ENCOUNTER — Ambulatory Visit (INDEPENDENT_AMBULATORY_CARE_PROVIDER_SITE_OTHER): Payer: PRIVATE HEALTH INSURANCE

## 2016-09-14 ENCOUNTER — Ambulatory Visit (INDEPENDENT_AMBULATORY_CARE_PROVIDER_SITE_OTHER): Payer: PRIVATE HEALTH INSURANCE | Admitting: Orthopaedic Surgery

## 2016-09-14 ENCOUNTER — Telehealth (INDEPENDENT_AMBULATORY_CARE_PROVIDER_SITE_OTHER): Payer: Self-pay | Admitting: Radiology

## 2016-09-14 DIAGNOSIS — G5602 Carpal tunnel syndrome, left upper limb: Secondary | ICD-10-CM

## 2016-09-14 DIAGNOSIS — G5601 Carpal tunnel syndrome, right upper limb: Secondary | ICD-10-CM | POA: Insufficient documentation

## 2016-09-14 NOTE — Telephone Encounter (Signed)
Lattie Haw, Have you received any information on this patient.  She states that she had Nerve Conduction study done that was sent to workers comp.  Dr. Erlinda Hong needs this to review.

## 2016-09-14 NOTE — Progress Notes (Signed)
Office Visit Note   Patient: Amy Aguilar           Date of Birth: 17-Feb-1976           MRN: [id number] Visit Date: 09/14/2016              Requested by: No referring provider defined for this encounter. PCP: No PCP Per Patient   Assessment & Plan: Visit Diagnoses:  1. Carpal tunnel syndrome, left   2. Carpal tunnel syndrome, right     Plan: Today's encounter was made more complex by the language barrier which did increase the time of that visit. I think that her job certainly has contributed to her carpal tunnel syndrome. It is certainly exacerbated it. We did talk about a steroid injection today which she declined until we get the results of the nerve conduction studies. We will have my office contact workers compensation about obtaining those results and then we will bring her back to discuss everything.  Follow-Up Instructions: Return we will call to bring her back for follow up apppointment.   Orders:  Orders Placed This Encounter  Procedures  . XR Hand Complete Right  . XR Hand Complete Left   No orders of the defined types were placed in this encounter.     Procedures: No procedures performed   Clinical Data: No additional findings.   Subjective: Chief Complaint  Patient presents with  . Left Hand - Pain  . Right Hand - Pain    The patient is a 40 year old Spanish-speaking female who comes in with bilateral hand pain for about 5 months that's worse with her job. She states that she was completely asymptomatic prior to starting her job. She now complains of swelling numbness weakness and dropping objects. She has used Tylenol and Advil with partial relief. She feels pain in the morning when she wakes up and it feels like there is something pulling and drawing her fingers in. She has had a nerve conduction study the results of which she does not have today and she is not exactly sure what those results were. The pain does not radiate. She denies any  constitutional symptoms.    Review of Systems  Constitutional: Negative.   HENT: Negative.   Eyes: Negative.   Respiratory: Negative.   Cardiovascular: Negative.   Endocrine: Negative.   Musculoskeletal: Negative.   Neurological: Negative.   Hematological: Negative.   Psychiatric/Behavioral: Negative.   All other systems reviewed and are negative.    Objective: Vital Signs: LMP 11/21/2014   Physical Exam  Constitutional: She is oriented to person, place, and time. She appears well-developed and well-nourished.  HENT:  Head: Atraumatic.  Eyes: EOM are normal.  Neck: Neck supple.  Cardiovascular: Intact distal pulses.   Pulmonary/Chest: Effort normal.  Abdominal: Soft.  Neurological: She is alert and oriented to person, place, and time.  Skin: Skin is warm. Capillary refill takes less than 2 seconds.  Psychiatric: She has a normal mood and affect. Her behavior is normal. Judgment and thought content normal.  Nursing note and vitals reviewed.   Ortho Exam Exam of bilateral hand shows no muscular atrophy. Positive Tinel's, Durkin's, Phalen's more so on the right hand. She has no skin lesions. Normal temperature and sensation. Specialty Comments:  No specialty comments available.  Imaging: No results found.   PMFS History: Patient Active Problem List   Diagnosis Date Noted  . Carpal tunnel syndrome, left 09/14/2016  . Carpal tunnel syndrome, right 09/14/2016  .  Fibroids 12/05/2014  . Leiomyoma of uterus 11/21/2014  . Anemia 11/21/2014  . Menorrhagia 09/18/2012   Past Medical History:  Diagnosis Date  . Abnormal uterine bleeding (AUB)   . Anemia    received blood transfusion 2 wks ago  . Blood transfusion without reported diagnosis   . Headache    migraines  . Hypertension    pt states my bp is fine  . Leiomyoma of uterus     Family History  Problem Relation Age of Onset  . Hypertension Father     Past Surgical History:  Procedure Laterality Date  .  BILATERAL SALPINGECTOMY Bilateral 12/05/2014   Procedure: BILATERAL SALPINGECTOMY;  Surgeon: Margarette Asal, MD;  [facility];  Service: Gynecology;  Laterality: Bilateral;  . LAPAROSCOPIC ASSISTED VAGINAL HYSTERECTOMY N/A 12/05/2014   Procedure: LAPAROSCOPY, ATTEMPTED VAGINAL HYSTERECTOMY, TOTAL ABDOMINAL HYSTERECTOMY;  Surgeon: Margarette Asal, MD;  Location: [place];  Service: Gynecology;  Laterality: N/A;  . NO PAST SURGERIES     Social History   Occupational History  . Not on file.   Social History Main Topics  . Smoking status: Never Smoker  . Smokeless tobacco: Never Used  . Alcohol use No  . Drug use: No  . Sexual activity: Yes     Comment: none now

## 2016-09-20 NOTE — Telephone Encounter (Signed)
I have no information about this patient having Wc. It looks like her charges are being filed to [place]. Do you have any information about her having Wc?   Thank you!

## 2016-09-24 NOTE — Telephone Encounter (Signed)
Do you have any information on this patient?

## 2016-09-24 NOTE — Telephone Encounter (Signed)
We have the EMG Report. It is sitting on Dr Phoebe Sharps desk. We don't have any other paper work for [facility] that I know of.

## 2016-10-04 ENCOUNTER — Encounter (INDEPENDENT_AMBULATORY_CARE_PROVIDER_SITE_OTHER): Payer: Self-pay | Admitting: Orthopaedic Surgery

## 2016-10-04 ENCOUNTER — Ambulatory Visit (INDEPENDENT_AMBULATORY_CARE_PROVIDER_SITE_OTHER): Payer: PRIVATE HEALTH INSURANCE | Admitting: Orthopaedic Surgery

## 2016-10-04 DIAGNOSIS — G5601 Carpal tunnel syndrome, right upper limb: Secondary | ICD-10-CM | POA: Diagnosis not present

## 2016-10-04 DIAGNOSIS — G5602 Carpal tunnel syndrome, left upper limb: Secondary | ICD-10-CM | POA: Diagnosis not present

## 2016-10-04 NOTE — Progress Notes (Signed)
Office Visit Note   Patient: Amy Aguilar           Date of Birth: Jun 16, 1976           MRN: [id number] Visit Date: 10/04/2016              Requested by: No referring provider defined for this encounter. PCP: No PCP Per Patient   Assessment & Plan: Visit Diagnoses:  1. Carpal tunnel syndrome, left   2. Carpal tunnel syndrome, right     Plan: Outside nerve conduction studies EMG reviewed and shows moderate carpal tunnel syndrome. These findings were reviewed with the patient through the use of an interpreter. We discussed conservative versus surgical treatment and the risks benefits alternatives related to each. She wishes to proceed with a right carpal tunnel release. She does want [place] to give her a call About the expected out of pocket expenses for her. Follow-Up Instructions: No Follow-up on file.   Orders:  No orders of the defined types were placed in this encounter.  No orders of the defined types were placed in this encounter.     Procedures: No procedures performed   Clinical Data: No additional findings.   Subjective: Chief Complaint  Patient presents with  . Right Hand - Pain, Follow-up  . Left Hand - Pain, Follow-up    Patient follows up today for her bilateral carpal tunnel syndrome. She is unchanged in terms of symptoms. He is here today with her interpreter.    Review of Systems  Constitutional: Negative.   HENT: Negative.   Eyes: Negative.   Respiratory: Negative.   Cardiovascular: Negative.   Endocrine: Negative.   Musculoskeletal: Negative.   Neurological: Negative.   Hematological: Negative.   Psychiatric/Behavioral: Negative.   All other systems reviewed and are negative.    Objective: Vital Signs: LMP 11/21/2014   Physical Exam  Constitutional: She is oriented to person, place, and time. She appears well-developed and well-nourished.  HENT:  Head: Atraumatic.  Eyes: EOM are normal.  Neck: Neck supple.    Cardiovascular: Intact distal pulses.   Pulmonary/Chest: Effort normal.  Abdominal: Soft.  Neurological: She is alert and oriented to person, place, and time.  Skin: Skin is warm. Capillary refill takes less than 2 seconds.  Psychiatric: She has a normal mood and affect. Her behavior is normal. Judgment and thought content normal.  Nursing note and vitals reviewed.   Ortho Exam Exam of bilateral hands is stable. Specialty Comments:  No specialty comments available.  Imaging: No results found.   PMFS History: Patient Active Problem List   Diagnosis Date Noted  . Carpal tunnel syndrome, left 09/14/2016  . Carpal tunnel syndrome, right 09/14/2016  . Fibroids 12/05/2014  . Leiomyoma of uterus 11/21/2014  . Anemia 11/21/2014  . Menorrhagia 09/18/2012   Past Medical History:  Diagnosis Date  . Abnormal uterine bleeding (AUB)   . Anemia    received blood transfusion 2 wks ago  . Blood transfusion without reported diagnosis   . Headache    migraines  . Hypertension    pt states my bp is fine  . Leiomyoma of uterus     Family History  Problem Relation Age of Onset  . Hypertension Father     Past Surgical History:  Procedure Laterality Date  . BILATERAL SALPINGECTOMY Bilateral 12/05/2014   Procedure: BILATERAL SALPINGECTOMY;  Surgeon: Margarette Asal, MD;  Location: [facility];  Service: Gynecology;  Laterality: Bilateral;  . LAPAROSCOPIC ASSISTED VAGINAL  HYSTERECTOMY N/A 12/05/2014   Procedure: LAPAROSCOPY, ATTEMPTED VAGINAL HYSTERECTOMY, TOTAL ABDOMINAL HYSTERECTOMY;  Surgeon: Margarette Asal, MD;  Location: [place];  Service: Gynecology;  Laterality: N/A;  . NO PAST SURGERIES     Social History   Occupational History  . Not on file.   Social History Main Topics  . Smoking status: Never Smoker  . Smokeless tobacco: Never Used  . Alcohol use No  . Drug use: No  . Sexual activity: Yes     Comment: none now

## 2017-01-26 ENCOUNTER — Encounter (HOSPITAL_COMMUNITY): Payer: Self-pay | Admitting: Emergency Medicine

## 2017-01-26 ENCOUNTER — Ambulatory Visit (HOSPITAL_COMMUNITY)
Admission: EM | Admit: 2017-01-26 | Discharge: 2017-01-26 | Disposition: A | Discharge status: Home / Self Care | Payer: Commercial Managed Care - PPO | Attending: Internal Medicine | Admitting: Internal Medicine

## 2017-01-26 DIAGNOSIS — M609 Myositis, unspecified: Secondary | ICD-10-CM

## 2017-01-26 DIAGNOSIS — T148XXA Other injury of unspecified body region, initial encounter: Secondary | ICD-10-CM | POA: Diagnosis not present

## 2017-01-26 MED ORDER — DICLOFENAC SODIUM 1 % TD GEL: 1.0000 "application " | Freq: Four times a day (QID) | TRANSDERMAL | 0 refills | Status: DC

## 2017-01-26 MED ORDER — NAPROXEN 375 MG PO TABS: 375.0000 mg | ORAL_TABLET | Freq: Two times a day (BID) | ORAL | 0 refills | Status: DC

## 2017-01-26 NOTE — ED Triage Notes (Signed)
Mid to upper back pain for one week.  Pain is worsening

## 2017-01-26 NOTE — ED Provider Notes (Signed)
CSN: [id number]     Arrival date & time 01/26/17  1559 History   First MD Initiated Contact with Patient 01/26/17 1655     Chief Complaint  Patient presents with  . Back Pain   (Consider location/radiation/quality/duration/timing/severity/associated sxs/prior Treatment) Pleasant 41 year old Hispanic female who speaks English is complaining of pain in her shoulders and right upper back. She states that it started approximately one week ago. 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 The pain is worse when working. Denies any blunt trauma, fall or other type injury. Denies shortness of breath or chest pain.      Past Medical History:  Diagnosis Date  . Abnormal uterine bleeding (AUB)   . Anemia    received blood transfusion 2 wks ago  . Blood transfusion without reported diagnosis   . Headache    migraines  . Hypertension    pt states my bp is fine  . Leiomyoma of uterus    Past Surgical History:  Procedure Laterality Date  . BILATERAL SALPINGECTOMY Bilateral 12/05/2014   Procedure: BILATERAL SALPINGECTOMY;  Surgeon: Margarette Asal, MD;  Location: [facility];  Service: Gynecology;  Laterality: Bilateral;  . LAPAROSCOPIC ASSISTED VAGINAL HYSTERECTOMY N/A 12/05/2014   Procedure: LAPAROSCOPY, ATTEMPTED VAGINAL HYSTERECTOMY, TOTAL ABDOMINAL HYSTERECTOMY;  Surgeon: Margarette Asal, MD;  Location: [place];  Service: Gynecology;  Laterality: N/A;  . NO PAST SURGERIES     Family History  Problem Relation Age of Onset  . Hypertension Father    Social History  Substance Use Topics  . Smoking status: Never Smoker  . Smokeless tobacco: Never Used  . Alcohol use No   OB History    Gravida Para Term Preterm AB Living   4 4 4     5    SAB TAB Ectopic Multiple Live Births         1 5     Review of Systems  Constitutional: Negative.  Negative for activity change, chills and fever.   HENT: Negative.   Respiratory: Negative.   Cardiovascular: Negative.   Musculoskeletal:       As per HPI  Skin: Negative for color change, pallor and rash.  Neurological: Negative.   All other systems reviewed and are negative.   Allergies  Patient has no known allergies.  Home Medications   Prior to Admission medications   Medication Sig Start Date End Date Taking? Authorizing Provider  diclofenac sodium (VOLTAREN) 1 % GEL Apply 1 application topically 4 (four) times daily. 01/26/17   Janne Napoleon, NP  naproxen (NAPROSYN) 375 MG tablet Take 1 tablet (375 mg total) by mouth 2 (two) times daily. 01/26/17   Janne Napoleon, NP  traMADol (ULTRAM) 50 MG tablet Take 100 mg by mouth every 8 (eight) hours as needed for moderate pain.    Historical Provider, MD   Meds Ordered and Administered this Visit  Medications - No data to display  BP 118/80 (BP Location: Right Arm)   Pulse 70   Temp 97.9 F (36.6 C) (Oral)   Resp 18   LMP 11/21/2014 Comment: bleeding since December  SpO2 98%  No data found.   Physical Exam  Constitutional: She is oriented to person, place, and time. She appears well-developed and well-nourished. No distress.  HENT:  Head: Normocephalic and atraumatic.  Eyes: EOM are normal.  Neck: Normal range of motion. Neck supple.  Cardiovascular: Normal rate, regular  rhythm and normal heart sounds.   Pulmonary/Chest: Effort normal and breath sounds normal. No respiratory distress. She has no wheezes. She has no rales. She exhibits no tenderness.  Musculoskeletal: Normal range of motion. She exhibits tenderness. She exhibits no edema or deformity.  Tenderness over the right trapezius muscle from the base of the neck, over the ridge and parathoracic musculature. There is tenderness to the supra and infra  spinatus muscle. No joint tenderness.   Lymphadenopathy:    She has no cervical adenopathy.  Neurological: She is alert and oriented to person, place, and time. No cranial  nerve deficit.  Skin: Skin is warm and dry.  Psychiatric: She has a normal mood and affect.    Urgent Care Course     Procedures (including critical care time)  Labs Review Labs Reviewed - No data to display  Imaging Review No results found.   Visual Acuity Review  Right Eye Distance:   Left Eye Distance:   Bilateral Distance:    Right Eye Near:   Left Eye Near:    Bilateral Near:         MDM   1. Myofasciitis   2. Muscle strain    Apply the anti-inflammatory gel over the areas of pain up to 4 times a day. Recommend placing heat over the area 4-5 times a day or using a thermal care wrap pad over the area pain all day. It works for about 8 hours. You may also taken Naprosyn for pain as directed. Take with food. Limit overhead working for the next few days as long as you can. Meds ordered this encounter  Medications  . diclofenac sodium (VOLTAREN) 1 % GEL    Sig: Apply 1 application topically 4 (four) times daily.    Dispense:  100 g    Refill:  0    Order Specific Question:   Supervising Provider    Answer:   Sherlene Shams [[id number]]  . naproxen (NAPROSYN) 375 MG tablet    Sig: Take 1 tablet (375 mg total) by mouth 2 (two) times daily.    Dispense:  20 tablet    Refill:  0    Order Specific Question:   Supervising Provider    Answer:   Sherlene Shams [[id number]]       Janne Napoleon, NP 01/26/17 [phone]

## 2017-01-26 NOTE — Discharge Instructions (Signed)
Apply the anti-inflammatory gel over the areas of pain up to 4 times a day. Recommend placing heat over the area 4-5 times a day or using a thermal care wrap pad over the area pain all day. It works for about 8 hours. You may also taken Naprosyn for pain as directed. Take with food. Limit overhead working for the next few days as long as you can.

## 2017-01-31 ENCOUNTER — Ambulatory Visit (INDEPENDENT_AMBULATORY_CARE_PROVIDER_SITE_OTHER): Payer: Commercial Managed Care - PPO | Admitting: Orthopaedic Surgery

## 2017-01-31 ENCOUNTER — Encounter (INDEPENDENT_AMBULATORY_CARE_PROVIDER_SITE_OTHER): Payer: Self-pay | Admitting: Orthopaedic Surgery

## 2017-01-31 DIAGNOSIS — G5602 Carpal tunnel syndrome, left upper limb: Secondary | ICD-10-CM | POA: Diagnosis not present

## 2017-01-31 DIAGNOSIS — G5601 Carpal tunnel syndrome, right upper limb: Secondary | ICD-10-CM

## 2017-01-31 NOTE — Progress Notes (Signed)
Office Visit Note   Patient: Amy Aguilar           Date of Birth: February 10, 1976           MRN: [id number] Visit Date: 01/31/2017              Requested by: No referring provider defined for this encounter. PCP: No PCP Per Patient   Assessment & Plan: Visit Diagnoses:  1. Carpal tunnel syndrome, left   2. Carpal tunnel syndrome, right     Plan: After a thorough discussion of treatment options including injections, bracing, surgery she wishes to proceed with surgical treatment of her left carpal tunnel syndrome. With the help of an interpreter we did discuss the risks benefits alternatives to surgery include and the expected rehabilitation and recovery from this. She understands and wishes to proceed. She will give [place] a call back when she wants to schedule surgery.  Follow-Up Instructions: No Follow-up on file.   Orders:  No orders of the defined types were placed in this encounter.  No orders of the defined types were placed in this encounter.     Procedures: No procedures performed   Clinical Data: No additional findings.   Subjective: Chief Complaint  Patient presents with  . Right Hand - Follow-up  . Left Hand - Follow-up    Patient comes back today to discuss further about her carpal tunnel syndrome. Her left hand is now worse. She's had outside studies showing moderate carpal tunnel syndrome. She has worn braces at night without any significant relief. She has a phobia of needles. She says that her hands are constantly painful and numb and she is dropping things.    Review of Systems  Constitutional: Negative.   HENT: Negative.   Eyes: Negative.   Respiratory: Negative.   Cardiovascular: Negative.   Endocrine: Negative.   Musculoskeletal: Negative.   Neurological: Negative.   Hematological: Negative.   Psychiatric/Behavioral: Negative.   All other systems reviewed and are negative.    Objective: Vital Signs: LMP 11/21/2014 Comment:  bleeding since December  Physical Exam  Constitutional: She is oriented to person, place, and time. She appears well-developed and well-nourished.  Pulmonary/Chest: Effort normal.  Neurological: She is alert and oriented to person, place, and time.  Skin: Skin is warm. Capillary refill takes less than 2 seconds.  Psychiatric: She has a normal mood and affect. Her behavior is normal. Judgment and thought content normal.  Nursing note and vitals reviewed.   Ortho Exam  Bilateral hand exam is stable. Specialty Comments:  No specialty comments available.  Imaging: No results found.   PMFS History: Patient Active Problem List   Diagnosis Date Noted  . Carpal tunnel syndrome, left 09/14/2016  . Carpal tunnel syndrome, right 09/14/2016  . Fibroids 12/05/2014  . Leiomyoma of uterus 11/21/2014  . Anemia 11/21/2014  . Menorrhagia 09/18/2012   Past Medical History:  Diagnosis Date  . Abnormal uterine bleeding (AUB)   . Anemia    received blood transfusion 2 wks ago  . Blood transfusion without reported diagnosis   . Headache    migraines  . Hypertension    pt states my bp is fine  . Leiomyoma of uterus     Family History  Problem Relation Age of Onset  . Hypertension Father     Past Surgical History:  Procedure Laterality Date  . BILATERAL SALPINGECTOMY Bilateral 12/05/2014   Procedure: BILATERAL SALPINGECTOMY;  Surgeon: Margarette Asal, MD;  Location: [facility];  Service: Gynecology;  Laterality: Bilateral;  . LAPAROSCOPIC ASSISTED VAGINAL HYSTERECTOMY N/A 12/05/2014   Procedure: LAPAROSCOPY, ATTEMPTED VAGINAL HYSTERECTOMY, TOTAL ABDOMINAL HYSTERECTOMY;  Surgeon: Margarette Asal, MD;  Location: [facility];  Service: Gynecology;  Laterality: N/A;  . NO PAST SURGERIES     Social History   Occupational History  . Not on file.   Social History Main Topics  . Smoking status: Never Smoker  . Smokeless tobacco: Never Used  . Alcohol use  No  . Drug use: No  . Sexual activity: Yes     Comment: none now

## 2017-03-04 ENCOUNTER — Encounter (HOSPITAL_COMMUNITY): Payer: Self-pay | Admitting: *Deleted

## 2017-03-29 ENCOUNTER — Ambulatory Visit (INDEPENDENT_AMBULATORY_CARE_PROVIDER_SITE_OTHER): Payer: Commercial Managed Care - PPO | Admitting: Orthopaedic Surgery

## 2017-03-29 ENCOUNTER — Encounter (INDEPENDENT_AMBULATORY_CARE_PROVIDER_SITE_OTHER): Payer: Self-pay | Admitting: Orthopaedic Surgery

## 2017-03-29 DIAGNOSIS — G5601 Carpal tunnel syndrome, right upper limb: Secondary | ICD-10-CM | POA: Diagnosis not present

## 2017-03-29 DIAGNOSIS — G5602 Carpal tunnel syndrome, left upper limb: Secondary | ICD-10-CM | POA: Diagnosis not present

## 2017-03-29 MED ORDER — BUPIVACAINE HCL 0.5 % IJ SOLN
0.5000 mL | INTRAMUSCULAR | Status: AC | PRN
  Administered 2017-03-29: .5 mL

## 2017-03-29 MED ORDER — LIDOCAINE HCL 1 % IJ SOLN
0.5000 mL | INTRAMUSCULAR | Status: AC | PRN
  Administered 2017-03-29: .5 mL

## 2017-03-29 MED ORDER — METHYLPREDNISOLONE ACETATE 40 MG/ML IJ SUSP
20.0000 mg | INTRAMUSCULAR | Status: AC | PRN
  Administered 2017-03-29: 20 mg

## 2017-03-29 NOTE — Progress Notes (Signed)
Office Visit Note   Patient: Amy Aguilar           Date of Birth: 21-Dec-1975           MRN: [id number] Visit Date: 03/29/2017              Requested by: No referring provider defined for this encounter. PCP: Patient, No Pcp Per   Assessment & Plan: Visit Diagnoses:  1. Carpal tunnel syndrome, left   2. Carpal tunnel syndrome, right     Plan: Bilateral carpal tunnel injections were performed today. Follow-up with me as needed.  Follow-Up Instructions: Return if symptoms worsen or fail to improve.   Orders:  No orders of the defined types were placed in this encounter.  No orders of the defined types were placed in this encounter.     Procedures: Hand/UE Inj Date/Time: 03/29/2017 4:46 PM Performed by: Leandrew Koyanagi Authorized by: Leandrew Koyanagi   Consent Given by:  Patient Timeout: prior to procedure the correct patient, procedure, and site was verified   Indications:  Pain Condition: carpal tunnel   Site:  R carpal tunnel Prep: patient was prepped and draped in usual sterile fashion   Needle Size:  25 G Approach:  Volar Medications:  0.5 mL lidocaine 1 %; 0.5 mL bupivacaine 0.5 %; 20 mg methylPREDNISolone acetate 40 MG/ML Hand/UE Inj Date/Time: 03/29/2017 4:46 PM Performed by: Leandrew Koyanagi Authorized by: Leandrew Koyanagi   Consent Given by:  Patient Timeout: prior to procedure the correct patient, procedure, and site was verified   Condition: carpal tunnel   Site:  L carpal tunnel Prep: patient was prepped and draped in usual sterile fashion   Needle Size:  25 G Approach:  Volar Medications:  0.5 mL lidocaine 1 %; 0.5 mL bupivacaine 0.5 %; 20 mg methylPREDNISolone acetate 40 MG/ML     Clinical Data: No additional findings.   Subjective: Chief Complaint  Patient presents with  . Right Hand - Pain  . Left Hand - Pain    Patient is following up today for bilateral carpal tunnel syndrome. She had outside nerve conduction studies which  showed moderate carpal tunnel syndrome. She states she is having trouble sleeping at night. She is requesting injection. She denies any new symptoms.    Review of Systems  Constitutional: Negative.   HENT: Negative.   Eyes: Negative.   Respiratory: Negative.   Cardiovascular: Negative.   Endocrine: Negative.   Musculoskeletal: Negative.   Neurological: Negative.   Hematological: Negative.   Psychiatric/Behavioral: Negative.   All other systems reviewed and are negative.    Objective: Vital Signs: LMP 11/21/2014 Comment: bleeding since December  Physical Exam  Constitutional: She is oriented to person, place, and time. She appears well-developed and well-nourished.  Pulmonary/Chest: Effort normal.  Neurological: She is alert and oriented to person, place, and time.  Skin: Skin is warm. Capillary refill takes less than 2 seconds.  Psychiatric: She has a normal mood and affect. Her behavior is normal. Judgment and thought content normal.  Nursing note and vitals reviewed.   Ortho Exam Bilateral hand exam is unchanged and stable. Specialty Comments:  No specialty comments available.  Imaging: No results found.   PMFS History: Patient Active Problem List   Diagnosis Date Noted  . Carpal tunnel syndrome, left 09/14/2016  . Carpal tunnel syndrome, right 09/14/2016  . Fibroids 12/05/2014  . Leiomyoma of uterus 11/21/2014  . Anemia 11/21/2014  . Menorrhagia 09/18/2012   Past Medical  History:  Diagnosis Date  . Abnormal uterine bleeding (AUB)   . Anemia    received blood transfusion 2 wks ago  . Blood transfusion without reported diagnosis   . Headache    migraines  . Hypertension    pt states my bp is fine  . Leiomyoma of uterus   . Uterine fibroid     Family History  Problem Relation Age of Onset  . Hypertension Father     Past Surgical History:  Procedure Laterality Date  . ABDOMINAL HYSTERECTOMY    . BILATERAL SALPINGECTOMY Bilateral 12/05/2014    Procedure: BILATERAL SALPINGECTOMY;  Surgeon: Margarette Asal, MD;  Location: [facility];  Service: Gynecology;  Laterality: Bilateral;  . LAPAROSCOPIC ASSISTED VAGINAL HYSTERECTOMY N/A 12/05/2014   Procedure: LAPAROSCOPY, ATTEMPTED VAGINAL HYSTERECTOMY, TOTAL ABDOMINAL HYSTERECTOMY;  Surgeon: Margarette Asal, MD;  Location: [facility];  Service: Gynecology;  Laterality: N/A;  . NO PAST SURGERIES     Social History   Occupational History  . Not on file.   Social History Main Topics  . Smoking status: Never Smoker  . Smokeless tobacco: Never Used  . Alcohol use No  . Drug use: No  . Sexual activity: Yes     Comment: none now

## 2017-03-30 ENCOUNTER — Telehealth (INDEPENDENT_AMBULATORY_CARE_PROVIDER_SITE_OTHER): Payer: Self-pay | Admitting: Radiology

## 2017-03-30 ENCOUNTER — Telehealth (INDEPENDENT_AMBULATORY_CARE_PROVIDER_SITE_OTHER): Payer: Self-pay

## 2017-03-30 NOTE — Telephone Encounter (Signed)
Patient is calling today. She is status post bilateral carpal tunnel injections. She is having discomfort,slight swelling, and minimal warmth. Advised these symptoms are not uncommon. Advised that to please follow closely and call back if worsening. Work note written, since she feels she is unable to work today. Ok per Dr. Erlinda Hong.

## 2017-04-13 ENCOUNTER — Encounter: Payer: Self-pay | Admitting: Family Medicine

## 2017-04-13 ENCOUNTER — Ambulatory Visit (INDEPENDENT_AMBULATORY_CARE_PROVIDER_SITE_OTHER): Payer: Commercial Managed Care - PPO | Admitting: Family Medicine

## 2017-04-13 VITALS — BP 120/74 | HR 79 | Resp 12 | Ht 61.0 in | Wt 180.0 lb

## 2017-04-13 DIAGNOSIS — Z6834 Body mass index (BMI) 34.0-34.9, adult: Secondary | ICD-10-CM

## 2017-04-13 DIAGNOSIS — E669 Obesity, unspecified: Secondary | ICD-10-CM | POA: Diagnosis not present

## 2017-04-13 DIAGNOSIS — R928 Other abnormal and inconclusive findings on diagnostic imaging of breast: Secondary | ICD-10-CM | POA: Diagnosis not present

## 2017-04-13 DIAGNOSIS — I83813 Varicose veins of bilateral lower extremities with pain: Secondary | ICD-10-CM

## 2017-04-13 NOTE — Progress Notes (Signed)
HPI:   [name] is a 41 y.o. female, who is here today to establish care.  Former PCP: N/A Last preventive routine visit: 2017.  Chronic medical problems: Chronic headaches, problem has improved over the past years.  She lives with her 5 children. Her ex husband helps her with financial support.  Concerns today:   She is reporting mammogram done in 11/2016, it was done at work, through employer. According to pt, it was  recommend having another one for right breast. A week ago she had some mild pain, "pulling" like sensation.  She has had abnormal mammograms of right breast in the past. She denies FHx for breast or ovarian cancer.  G: 4 L: 5  -Varicose veins, getting worse. Because her job, she is up for long periods of time.  Pain is worse around the beginning of her mentrual cycle. S/P hysterectomy, still having leg discomfort monthly, occasionally peri ankle edema at the end of the day.  Cramps on calves 2-3 times per week.  For the past 3 months she has tried to eat healthier. Decreased sodas and carbs intake. She is not exercising regularly.   Review of Systems  Constitutional: Negative for activity change, appetite change, fatigue, fever and unexpected weight change.  HENT: Negative for mouth sores, nosebleeds and trouble swallowing.   Eyes: Negative for redness and visual disturbance.  Respiratory: Negative for cough, shortness of breath and wheezing.   Cardiovascular: Negative for chest pain, palpitations and leg swelling.  Gastrointestinal: Negative for abdominal pain, nausea and vomiting.       Negative for changes in bowel habits.  Genitourinary: Negative for decreased urine volume, difficulty urinating, dysuria and hematuria.  Musculoskeletal: Positive for myalgias. Negative for gait problem.  Neurological: Negative for syncope, weakness, numbness and headaches.  Hematological: Negative for adenopathy. Does not bruise/bleed easily.      No current outpatient prescriptions on file prior to visit.   No current facility-administered medications on file prior to visit.     Past Medical History:  Diagnosis Date  . Abnormal uterine bleeding (AUB)   . Anemia    received blood transfusion 2 wks ago  . Blood transfusion without reported diagnosis   . Headache    migraines  . Hypertension    pt states my bp is fine  . Leiomyoma of uterus   . Uterine fibroid    No Known Allergies  Family History  Problem Relation Age of Onset  . Hypertension Father     Social History   Social History  . Marital status: Divorced    Spouse name: N/A  . Number of children: N/A  . Years of education: N/A   Social History Main Topics  . Smoking status: Never Smoker  . Smokeless tobacco: Never Used  . Alcohol use No  . Drug use: No  . Sexual activity: Yes     Comment: none now   Other Topics Concern  . None   Social History Narrative   ** Merged History Encounter **       Marital status: married x 17 years; happily married.  From [place]; moved to [place] 2000. Children: 5 children; no grandchildren.  Living: husband, 5 children. Employment: homemaker.    Vitals:   04/13/17 1410  BP: 120/74  Pulse: 79  Resp: 12   O2 sat at RA 98% Body mass index is 34.01 kg/m.  Physical Exam  Nursing note and vitals reviewed. Constitutional: She is oriented to person,  place, and time. She appears well-developed. No distress.  HENT:  Head: Atraumatic.  Mouth/Throat: Oropharynx is clear and moist and mucous membranes are normal.  Eyes: Conjunctivae and EOM are normal. Pupils are equal, round, and reactive to light.  Neck: No tracheal deviation present. No thyroid mass and no thyromegaly present.  Cardiovascular: Normal rate and regular rhythm.   No murmur heard. Pulses:      Dorsalis pedis pulses are 2+ on the right side, and 2+ on the left side.  Moderate varicose veins LE bilateral.  Respiratory: Effort normal and breath  sounds normal. No respiratory distress.  GI: Soft. She exhibits no mass. There is no hepatomegaly. There is no tenderness.  Genitourinary: No breast swelling or tenderness.  Genitourinary Comments: Fibrocystic breat changes bilateral R>L.  Musculoskeletal: She exhibits no edema.  Lymphadenopathy:    She has no cervical adenopathy.    She has axillary adenopathy.  Palpable 1 cm right axillary lymph node,mildly tender.  Neurological: She is alert and oriented to person, place, and time. She has normal strength. Gait normal.  Skin: Skin is warm. No abrasion and no rash noted. No erythema.  Psychiatric: She has a normal mood and affect.  Well groomed, good eye contact.      ASSESSMENT AND PLAN:   Amy Aguilar was seen today for establish care.  Diagnoses and all orders for this visit:  Abnormal mammogram of right breast  Further recommendations will be given according to lab results.  -     MM Digital Diagnostic Bilat; Future  Varicose veins of bilateral lower extremities with pain  Educated about Dx, prognosis,and treatment options. She agrees with trying compression stockings, handout given. She will let me know if symptoms persist, so vascular referral can be placed. Wt loss may help.   Class 1 obesity without serious comorbidity with body mass index (BMI) of 34.0 to 34.9 in adult, unspecified obesity type  We discussed benefits of wt loss as well as adverse effects of obesity. Consistency with healthy diet and physical activity recommended. Daily brisk walking for 15-30 min as tolerated.   She is reporting labs done, negative screening for DM and lipid disorder.      Amy Lindseth G. [place], MD  [facility]. [place] office.

## 2017-04-13 NOTE — Patient Instructions (Addendum)
A few things to remember from today's visit:   Abnormal mammogram of right breast - Plan: MM Digital Diagnostic Bilat  Varicose veins of bilateral lower extremities with pain   Recuento de caloras para bajar de peso (Calorie Counting for Weight Loss) Las caloras son energa que se obtiene de lo que se come y se bebe. El organismo [place] esta energa para mantenerlo Curator. La cantidad de caloras que come tiene incidencia Lubrizol Corporation. Cuando come ms caloras de las que el cuerpo necesita, este acumula las caloras extra Meyers. Cuando come [facility] de las que el cuerpo [place], este quema grasa para obtener la energa que requiere. El recuento de caloras es el registro de la cantidad de caloras que come y Pharmacologist. Si se asegura de comer menos caloras de las que el cuerpo necesita, debe bajar de [place]. Para que el recuento de caloras funcione, tendr que comer la cantidad de caloras adecuadas para usted en un da, para bajar una cantidad de peso saludable por semana. Una cantidad de peso saludable para bajar por semana suele ser Sutter 1 y Ivar Drape (0,5 a 0,9kg). Un nutricionista puede determinar la cantidad de caloras que necesita por da y sugerirle cmo alcanzar su objetivo calrico. CUL ES MI PLAN? Mi objetivo es comer ____1800-200__ caloras por da.  QU DEBO SABER ACERCA DEL [place]? A fin de alcanzar su objetivo diario de caloras, tendr que:  Averiguar cuntas caloras hay en cada alimento que le Therapist, occupational. Intente hacerlo antes de comer.  Decida la cantidad que puede comer del alimento.  Anote lo que comi y cuntas caloras tena. Esta tarea se conoce como llevar un registro de comidas. DNDE ENCUENTRO INFORMACIN SOBRE LAS CALORAS? Es posible Animator cantidad de caloras que contiene un alimento en la etiqueta de informacin nutricional. Tenga en cuenta que toda la informacin que se incluye en una etiqueta se basa en  una porcin especfica del alimento. Si un alimento no tiene una etiqueta de informacin nutricional, intente buscar las caloras en Internet o pida ayuda al nutricionista. CMO DECIDO CUNTO COMER? Para decidir qu cantidad del alimento puede comer, tendr que tener en cuenta el nmero de caloras en una porcin y el tamao de una porcin. Es posible Financial trader en la etiqueta de informacin nutricional. Si un alimento no tiene una etiqueta de informacin nutricional, busque los [place] en Internet o pida ayuda al nutricionista. Recuerde que las caloras se calculan por porcin. Si opta por comer ms de una porcin de un alimento, tendr Tenneco Inc las caloras por porcin por la cantidad de porciones que planea comer. Por ejemplo, la etiqueta de un envase de pan puede decir que el tamao de una porcin es [place], y que una porcin tiene 90caloras. Si come 1rodaja, habr comido 90caloras. Si come 2rodajas, habr comido 180caloras. [place]? Despus de cada comida, registre la siguiente informacin en el registro de comidas:  Lo que comi.  La cantidad que comi.  La cantidad de caloras que tena.  Luego, sume las caloras. Tenga a Materials engineer de comidas, por ejemplo, en un anotador de bolsillo. Otra alternativa es usar una aplicacin en el telfono mvil o un sitio web. Algunos programas calcularn las caloras y [employer] Stores la cantidad de caloras que le [place], [place] vez que agregue un alimento al Control and instrumentation engineer. CULES SON ALGUNOS CONSEJOS PARA EL [place]?  Use las caloras de los alimentos  y las bebidas que lo sacien y no lo dejen con apetito, por ejemplo, frutos secos y Engineer, mining de frutos secos, verduras, protenas magras y alimentos con alto contenido de fibra (ms de 5g de fibra por porcin).  Coma alimentos nutritivos y evite las caloras vacas. Las caloras vacas son aquellas que se obtienen de los alimentos o las  bebidas que no contienen muchos nutrientes, como los dulces y los refrescos. Es mejor comer una comida nutritiva altamente calrica (como un aguacate) que una con pocos nutrientes (como una bolsa de patatas fritas).  Sepa cuntas caloras tienen los alimentos que come con ms frecuencia. eBay, no tiene que buscar este dato cada vez que los come.  Est atento a los Chiropodist hipocalricos, pero que en realidad contienen muchas caloras, como los productos de [place], los refrescos y los dulces sin Lobbyist.  Preste atencin a las Automatic Data, [place] [employer] refrescos, las bebidas a base de [place], las bebidas con alcohol y los jugos, que contienen muchas caloras, pero no le dan saciedad. Opte por las bebidas bajas en caloras, como el agua y las bebidas dietticas.  Concntrese en tratar de contar las caloras de los alimentos que tienen la mayor cantidad de caloras. Registrar las caloras de una ensalada que solo contiene hortalizas es menos importante que calcular las de un batido de [place].  Encuentre un mtodo para controlar las caloras que funcione para usted. Sea creativo. La State Farm de las personas que alcanzan el xito encuentran mtodos para llevar un registro de cunto comen en un da, incluso si no cuentan cada calora.  CULES SON ALGUNOS CONSEJOS PARA CONTROLAR LAS PORCIONES?  Sepa cuntas caloras hay en una porcin. Esto lo ayudar a saber cuntas porciones de un alimento determinado puede comer.  Use una taza medidora para medir los Northrop Grumman, lo que es muy til al principio. Con el tiempo, podr hacer un clculo estimativo de los tamaos de las porciones de algunos alimentos.  Dedique tiempo a poner porciones de diferentes alimentos en sus platos, tazones y tazas predilectos, a fin de saber cmo se ve una porcin.  Intente no comer directamente de [place] bolsa o una caja, ya que esto puede llevarlo a comer en exceso. Ponga la cantidad  Land O[employer]Lakes gustara comer en una taza o un plato, a fin de asegurarse de que est comiendo la porcin correcta.  Use platos, vasos y tazones ms pequeos para no comer en exceso. Esta es una forma rpida y sencilla de poner en prctica el control de las porciones. Si el plato es ms pequeo, le caben menos alimentos.  Intente no hacer muchas tareas mientras come, como ver televisin o usar la computadora. Si es la hora de comer, sintese a Conservation officer, nature y disfrute de Environmental education officer. Esto lo ayudar a que empiece a Marine scientist cundo est satisfecho. Tambin le permitir estar ms consciente de lo que come y cunto est comiendo.  [place]?  Pida porciones ms pequeas o porciones para nios.  Considere la posibilidad de Publishing rights manager un plato principal y las guarniciones, en lugar de pedir su propio plato principal.  Si pide su propio plato principal, coma solo la mitad. Pida una caja al comienzo de la comida y ponga all el resto del plato principal, para no sentir la tentacin de comerlo.  Busque las caloras en el men. Si se detallan las caloras, elija las opciones que contengan la  menor cantidad.  Elija platos que incluyan verduras, frutas, cereales integrales, productos lcteos con bajo contenido de grasa y Advertising account planner. Centrarse en elegir con inteligencia alimentos de cada uno de los 5grupos que puede ayudarlo a seguir por el buen camino en los restaurantes.  Opte por los alimentos hervidos, asados, cocidos a la parrilla o al vapor.  Elija el agua, la [place], [place] t helado sin azcar u otras bebidas que no contengan azcares agregados. Si desea una bebida alcohlica, escoja una opcin con menos caloras. Por ejemplo, un margarita normal puede The Sherwin-Williams, y un vaso de vino tiene unas 150.  No coma alimentos que contengan mantequilla, estn empanados, fritos o que se sirvan con salsa a base de crema. Generalmente, los alimentos que se  etiquetan como "crujientes" estn fritos, a menos que se indique lo contrario.  Ordene los Kimberly-Clark, las salsas y los jarabes aparte, ya que suelen tener muchas caloras; por lo tanto, no los consuma en grandes cantidades.  Tenga cuidado con las [place]. Muchas personas piensan que las ensaladas son Ardelia Mems opcin saludable, pero en muchas cosas, esto no es as. Hay muchas ensaladas que contienen tocino, pollo frito, grandes cantidades de [place], patatas fritas y Lexicographer. Todos estos productos son altamente calricos. Si desea Katherine Mantle, elija una de hortalizas y pida carnes a la parrilla o un filete. Ordene el aderezo aparte o pida aceite de [place] y vinagre o limn para Haematologist.  Haga un clculo estimativo de la cantidad de porciones que le sirven. Por ejemplo, una porcin de arroz cocido equivale a media taza o tiene el tamao aproximado de un molde de [place], o de media pelota de tenis. Conocer el tamao de las porciones lo ayudar a Personnel officer atento a la cantidad de comida que come Occidental Petroleum. La lista que sigue le [place] el tamao de algunas porciones comunes a partir de objetos cotidianos. ? 1onza (28g) = 4dados apilados. ? 3onzas (85g) = 62mazo de cartas. ? 1cucharadita = 1dado. ? 1cucharada = media pelota de tenis de mesa. ? 2cucharadas = 1pelota de tenis de mesa. ? Media taza = 1pelota de tenis o 77molde de magdalena. ? 1taza = 1 pelota de bisbol.  Esta informacin no tiene Marine scientist el consejo del mdico. Asegrese de hacerle al mdico cualquier pregunta que tenga. Document Released: 01/27/2009 Document Revised: 11/01/2014 Document Reviewed: 08/16/2013 Elsevier Interactive Patient Education  2017 [place].  Please be sure medication list is accurate. If a new problem present, please set up appointment sooner than planned today.

## 2017-04-18 ENCOUNTER — Other Ambulatory Visit: Payer: Self-pay | Admitting: Family Medicine

## 2017-04-18 DIAGNOSIS — R928 Other abnormal and inconclusive findings on diagnostic imaging of breast: Secondary | ICD-10-CM

## 2017-07-07 ENCOUNTER — Emergency Department (HOSPITAL_COMMUNITY)
Admission: EM | Admit: 2017-07-07 | Discharge: 2017-07-07 | Disposition: A | Discharge status: Home / Self Care | Payer: Self-pay | Attending: Emergency Medicine | Admitting: Emergency Medicine

## 2017-07-07 ENCOUNTER — Encounter (HOSPITAL_COMMUNITY): Payer: Self-pay

## 2017-07-07 ENCOUNTER — Emergency Department (HOSPITAL_COMMUNITY): Payer: Self-pay

## 2017-07-07 DIAGNOSIS — R519 Headache, unspecified: Secondary | ICD-10-CM

## 2017-07-07 DIAGNOSIS — I10 Essential (primary) hypertension: Secondary | ICD-10-CM | POA: Insufficient documentation

## 2017-07-07 DIAGNOSIS — R0789 Other chest pain: Secondary | ICD-10-CM | POA: Insufficient documentation

## 2017-07-07 DIAGNOSIS — R51 Headache: Secondary | ICD-10-CM | POA: Insufficient documentation

## 2017-07-07 DIAGNOSIS — M25512 Pain in left shoulder: Secondary | ICD-10-CM | POA: Insufficient documentation

## 2017-07-07 LAB — CBC
HCT: 39.6 % (ref 36.0–46.0)
Hemoglobin: 13 g/dL (ref 12.0–15.0)
MCH: 29.3 pg (ref 26.0–34.0)
MCHC: 32.8 g/dL (ref 30.0–36.0)
MCV: 89.2 fL (ref 78.0–100.0)
PLATELETS: 247 10*3/uL (ref 150–400)
RBC: 4.44 MIL/uL (ref 3.87–5.11)
RDW: 13 % (ref 11.5–15.5)
WBC: 6.8 10*3/uL (ref 4.0–10.5)

## 2017-07-07 LAB — BASIC METABOLIC PANEL
ANION GAP: 6 (ref 5–15)
BUN: 9 mg/dL (ref 6–20)
CALCIUM: 8.6 mg/dL — AB (ref 8.9–10.3)
CO2: 24 mmol/L (ref 22–32)
Chloride: 108 mmol/L (ref 101–111)
Creatinine, Ser: 0.52 mg/dL (ref 0.44–1.00)
GFR calc Af Amer: >60 mL/min (ref >60)
Glucose, Bld: 102 mg/dL — ABNORMAL HIGH (ref 65–99)
Potassium: 3.8 mmol/L (ref 3.5–5.1)
Sodium: 138 mmol/L (ref 135–145)

## 2017-07-07 LAB — I-STAT TROPONIN, ED: Troponin i, poc: 0 ng/mL (ref 0.00–0.08)

## 2017-07-07 MED ORDER — ACETAMINOPHEN 325 MG PO TABS
650.0000 mg | ORAL_TABLET | Freq: Once | ORAL | Status: AC
  Administered 2017-07-07: 650 mg via ORAL
  Filled 2017-07-07: qty 2

## 2017-07-07 MED ORDER — NAPROXEN 500 MG PO TABS: 500.0000 mg | ORAL_TABLET | Freq: Two times a day (BID) | ORAL | 0 refills | Status: DC

## 2017-07-07 NOTE — ED Notes (Signed)
ED Provider at bedside. 

## 2017-07-07 NOTE — ED Provider Notes (Signed)
[facility] DEPT Provider Note   CSN: [id number] Arrival date & time: 07/07/17  7169     History   Chief Complaint Chief Complaint  Patient presents with  . Chest Pain  . Back Pain    HPI Amy Aguilar is a 41 y.o. female.  Patient presents with complaint of left shoulder pain with radiation to her left chest, left neck, and headache starting acutely and ongoing for 2 days. Symptoms are constant. Patient states that the pain is somewhat worse with movement of her neck when she looks to the left and with raising her left arm. Symptoms are not associated with exertion, vomiting, or diaphoresis. Patient also has a headache in the back of her head and is generalized around her head. She describes a bandlike distribution. She has a sore throat but no pain or difficulty swallowing. She states it "feels uncomfortable" on the left side of her throat. Patient denies signs of stroke including: facial droop, slurred speech, aphasia, weakness/numbness in extremities, imbalance/trouble walking. No vision change or vision loss. Patient denies risk factors for pulmonary embolism including: unilateral leg swelling, history of DVT/PE/other blood clots, use of exogenous hormones, recent immobilizations, recent surgery, recent travel (>4hr segment), malignancy, hemoptysis.        Past Medical History:  Diagnosis Date  . Abnormal uterine bleeding (AUB)   . Anemia    received blood transfusion 2 wks ago  . Blood transfusion without reported diagnosis   . Headache    migraines  . Hypertension    pt states my bp is fine  . Leiomyoma of uterus   . Uterine fibroid     Patient Active Problem List   Diagnosis Date Noted  . Varicose veins of bilateral lower extremities with pain 04/13/2017  . Class 1 obesity without serious comorbidity with body mass index (BMI) of 34.0 to 34.9 in adult 04/13/2017  . Carpal tunnel syndrome, left 09/14/2016  . Carpal tunnel syndrome, right 09/14/2016    . Fibroids 12/05/2014  . Leiomyoma of uterus 11/21/2014  . Anemia 11/21/2014  . Menorrhagia 09/18/2012    Past Surgical History:  Procedure Laterality Date  . ABDOMINAL HYSTERECTOMY    . BILATERAL SALPINGECTOMY Bilateral 12/05/2014   Procedure: BILATERAL SALPINGECTOMY;  Surgeon: Margarette Asal, MD;  Location: [facility];  Service: Gynecology;  Laterality: Bilateral;  . LAPAROSCOPIC ASSISTED VAGINAL HYSTERECTOMY N/A 12/05/2014   Procedure: LAPAROSCOPY, ATTEMPTED VAGINAL HYSTERECTOMY, TOTAL ABDOMINAL HYSTERECTOMY;  Surgeon: Margarette Asal, MD;  Location: [facility];  Service: Gynecology;  Laterality: N/A;  . NO PAST SURGERIES      OB History    Gravida Para Term Preterm AB Living   4 4 4  0 0 5   SAB TAB Ectopic Multiple Live Births   0 0 0 1 5       Home Medications    Prior to Admission medications   Medication Sig Start Date End Date Taking? Authorizing Provider  naproxen (NAPROSYN) 500 MG tablet Take 1 tablet (500 mg total) by mouth 2 (two) times daily. 07/07/17   Carlisle Cater, PA-C    Family History Family History  Problem Relation Age of Onset  . Hypertension Father     Social History Social History  Substance Use Topics  . Smoking status: Never Smoker  . Smokeless tobacco: Never Used  . Alcohol use No     Allergies   Patient has no known allergies.   Review of Systems Review of Systems  Constitutional: Negative  for diaphoresis and fever.  HENT: Positive for sore throat. Negative for congestion, dental problem, rhinorrhea and sinus pressure.   Eyes: Negative for photophobia, discharge, redness and visual disturbance.  Respiratory: Negative for cough and shortness of breath.   Cardiovascular: Positive for chest pain. Negative for palpitations and leg swelling.  Gastrointestinal: Negative for abdominal pain, nausea and vomiting.  Genitourinary: Negative for dysuria.  Musculoskeletal: Positive for myalgias. Negative  for back pain, gait problem, neck pain and neck stiffness.  Skin: Negative for rash.  Neurological: Positive for headaches. Negative for syncope, speech difficulty, weakness, light-headedness and numbness.  Psychiatric/Behavioral: Negative for confusion. The patient is not nervous/anxious.      Physical Exam Updated Vital Signs BP 120/85 (BP Location: Left Arm)   Pulse 71   Temp 98.6 F (37 C) (Oral)   Resp 16   LMP 11/21/2014   SpO2 100%   Physical Exam  Constitutional: She appears well-developed and well-nourished.  HENT:  Head: Normocephalic and atraumatic.  Right Ear: External ear normal.  Left Ear: External ear normal.  Nose: Nose normal. No rhinorrhea.  Mouth/Throat: Uvula is midline, oropharynx is clear and moist and mucous membranes are normal. Mucous membranes are not dry. No oropharyngeal exudate, posterior oropharyngeal edema or posterior oropharyngeal erythema.  Eyes: Conjunctivae are normal. Right eye exhibits no discharge. Left eye exhibits no discharge.  Neck: Trachea normal and normal range of motion. Neck supple. Normal carotid pulses and no JVD present. No muscular tenderness present. Carotid bruit is not present. No tracheal deviation present.  No masses noted on neck exam. No bruits heard. 2+ carotid pulses.  Cardiovascular: Normal rate, regular rhythm, S1 normal, S2 normal, normal heart sounds and intact distal pulses.  Exam reveals no decreased pulses.   No murmur heard. Pulmonary/Chest: Effort normal and breath sounds normal. No respiratory distress. She has no wheezes. She exhibits no tenderness.  Abdominal: Soft. Normal aorta and bowel sounds are normal. There is no tenderness. There is no rebound and no guarding.  Musculoskeletal: Normal range of motion.       Right shoulder: Normal. She exhibits normal range of motion, no tenderness and no bony tenderness.       Left shoulder: Normal. She exhibits normal range of motion, no tenderness and no bony  tenderness.       Cervical back: Normal. She exhibits normal range of motion, no tenderness and no bony tenderness.       Thoracic back: Normal. She exhibits normal range of motion, no tenderness and no bony tenderness.  Neurological: She is alert.  Skin: Skin is warm and dry. She is not diaphoretic. No cyanosis. No pallor.  Psychiatric: She has a normal mood and affect.  Nursing note and vitals reviewed.    ED Treatments / Results  Labs (all labs ordered are listed, but only abnormal results are displayed) Labs Reviewed  BASIC METABOLIC PANEL - Abnormal; Notable for the following:       Result Value   Glucose, Bld 102 (*)    Calcium 8.6 (*)    All other components within normal limits  CBC  I-STAT TROPONIN, ED   ED ECG REPORT   Date: 07/07/2017  Rate: 74  Rhythm: normal sinus rhythm  QRS Axis: normal  Intervals: normal  ST/T Wave abnormalities: normal  Conduction Disutrbances:none  Narrative Interpretation:   Old EKG Reviewed: unchanged  I have personally reviewed the EKG tracing and agree with the computerized printout as noted.  Radiology Dg Chest 2 View  Result Date: 07/07/2017 CLINICAL DATA:  Left arm pain, chest pain EXAM: CHEST  2 VIEW COMPARISON:  None. FINDINGS: Heart and mediastinal contours are within normal limits. No focal opacities or effusions. No acute bony abnormality. IMPRESSION: No active cardiopulmonary disease. Electronically Signed   By: Rolm Baptise M.D.   On: 07/07/2017 10:19    Procedures Procedures (including critical care time)  Medications Ordered in ED Medications  acetaminophen (TYLENOL) tablet 650 mg (not administered)     Initial Impression / Assessment and Plan / ED Course  I have reviewed the triage vital signs and the nursing notes.  Pertinent labs & imaging results that were available during my care of the patient were reviewed by me and considered in my medical decision making (see chart for details).     Patient seen and  examined. Reviewed EKGs and workup.   Vital signs reviewed and are as follows: BP 120/85 (BP Location: Left Arm)   Pulse 71   Temp 98.6 F (37 C) (Oral)   Resp 16   LMP 11/21/2014   SpO2 100%   Patient seen and examined. Will treat her symptoms conservatively with NSAIDs. Tylenol given in the emergency department for headache.  Patient was counseled to return with severe chest pain, especially if the pain is crushing or pressure-like and spreads to the arms, back, neck, or jaw, or if they have sweating, nausea, or shortness of breath with the pain. They were encouraged to call 911 with these symptoms.   Patient counseled to return if they have weakness in their arms or legs, slurred speech, trouble walking or talking, confusion, trouble with their balance, or if they have any other concerns. Patient verbalizes understanding and agrees with plan.    Final Clinical Impressions(s) / ED Diagnoses   Final diagnoses:  Acute nonintractable headache, unspecified headache type  Other chest pain  Acute pain of left shoulder   CP: Low concern for ACS or PE. Patient is perc negative. Pain is reproducible with motion and constant over the past 2 days. Troponin negative. EKG nonischemic. No exertional symptoms or other features suggestive of ACS. Suspect musculoskeletal component. Return instructions as above.  Neck pain: Full range of motion. No difficulty breathing or swallowing. Low concern for deep space abscess or other mechanical obstruction. No carotid bruits. No neuro deficits to suggest carotid or vertebral artery dissection as etiology. Would not scan patient based on her current exam.  Headache: Possibly tension related to musculoskeletal pain.   Patient without high-risk features of headache including: sudden onset/thunderclap HA, no similar headache in past, altered mental status, accompanying seizure, headache with exertion, age > 95, history of immunocompromise, neck or shoulder pain,  fever, use of anticoagulation, family history of spontaneous SAH, concomitant drug use, toxic exposure.   Patient has a normal complete neurological exam, normal vital signs, normal level of consciousness, no signs of meningismus, is well-appearing/non-toxic appearing, no signs of trauma.   Imaging with CT/MRI not indicated given history and physical exam findings.   No dangerous or life-threatening conditions suspected or identified by history, physical exam, and by work-up. No indications for hospitalization identified.      New Prescriptions New Prescriptions   NAPROXEN (NAPROSYN) 500 MG TABLET    Take 1 tablet (500 mg total) by mouth 2 (two) times daily.     Carlisle Cater, PA-C 07/07/17 1659    Daleen Bo, MD 07/08/17 [id number]

## 2017-07-07 NOTE — ED Triage Notes (Signed)
Patient complains of left anterior chest pain with radiation to left shoulder and arm x 2 days. Reports that the pain is worse with inspiration. NAD

## 2017-07-07 NOTE — Discharge Instructions (Signed)
Please read and follow all provided instructions.  Your diagnoses today include:  1. Acute nonintractable headache, unspecified headache type   2. Other chest pain   3. Acute pain of left shoulder     Tests performed today include:  An EKG of your heart  A chest x-ray  Cardiac enzymes - a blood test for heart muscle damage  Blood counts and electrolytes  Vital signs. See below for your results today.   Medications prescribed:   Naproxen - anti-inflammatory pain medication  Do not exceed 500mg  naproxen every 12 hours, take with food  You have been prescribed an anti-inflammatory medication or NSAID. Take with food. Take smallest effective dose for the shortest duration needed for your pain. Stop taking if you experience stomach pain or vomiting.   Take any prescribed medications only as directed.  Follow-up instructions: Please follow-up with your primary care provider in the next week for further evaluation of your symptoms if they are not better.   Return instructions:  SEEK IMMEDIATE MEDICAL ATTENTION IF:  You have severe chest pain, especially if the pain is crushing or pressure-like and spreads to the arms, back, neck, or jaw, or if you have sweating, nausea (feeling sick to your stomach), or shortness of breath. THIS IS AN EMERGENCY. Don't wait to see if the pain will go away. Get medical help at once. Call 911 or 0 (operator). DO NOT drive yourself to the hospital.   Your chest pain gets worse and does not go away with rest.   You have an attack of chest pain lasting longer than usual, despite rest and treatment with the medications your caregiver has prescribed.   You wake from sleep with chest pain or shortness of breath.  You feel dizzy or faint.  You have chest pain not typical of your usual pain for which you originally saw your caregiver.   You have any other emergent concerns regarding your health.  Additional Information: Chest pain comes from many  different causes. Your caregiver has diagnosed you as having chest pain that is not specific for one problem, but does not require admission.  You are at low risk for an acute heart condition or other serious illness.   Your vital signs today were: BP 120/85 (BP Location: Left Arm)    Pulse 71    Temp 98.6 F (37 C) (Oral)    Resp 16    LMP 11/21/2014    SpO2 100%  If your blood pressure (BP) was elevated above 135/85 this visit, please have this repeated by your doctor within one month. --------------

## 2017-08-17 NOTE — Telephone Encounter (Signed)
Note made in error

## 2018-02-21 ENCOUNTER — Emergency Department (HOSPITAL_COMMUNITY): Payer: Self-pay

## 2018-02-21 ENCOUNTER — Observation Stay (HOSPITAL_COMMUNITY)
Admission: EM | Admit: 2018-02-21 | Discharge: 2018-02-22 | Disposition: A | Discharge status: Home / Self Care | Payer: Self-pay | Attending: Internal Medicine | Admitting: Internal Medicine

## 2018-02-21 ENCOUNTER — Other Ambulatory Visit: Payer: Self-pay

## 2018-02-21 ENCOUNTER — Encounter (HOSPITAL_COMMUNITY): Payer: Self-pay | Admitting: Emergency Medicine

## 2018-02-21 DIAGNOSIS — R42 Dizziness and giddiness: Secondary | ICD-10-CM

## 2018-02-21 DIAGNOSIS — G43909 Migraine, unspecified, not intractable, without status migrainosus: Principal | ICD-10-CM | POA: Insufficient documentation

## 2018-02-21 DIAGNOSIS — Z6832 Body mass index (BMI) 32.0-32.9, adult: Secondary | ICD-10-CM | POA: Insufficient documentation

## 2018-02-21 DIAGNOSIS — E669 Obesity, unspecified: Secondary | ICD-10-CM | POA: Insufficient documentation

## 2018-02-21 DIAGNOSIS — J341 Cyst and mucocele of nose and nasal sinus: Secondary | ICD-10-CM | POA: Insufficient documentation

## 2018-02-21 DIAGNOSIS — R51 Headache: Secondary | ICD-10-CM

## 2018-02-21 DIAGNOSIS — Z8249 Family history of ischemic heart disease and other diseases of the circulatory system: Secondary | ICD-10-CM | POA: Insufficient documentation

## 2018-02-21 DIAGNOSIS — Z90710 Acquired absence of both cervix and uterus: Secondary | ICD-10-CM | POA: Insufficient documentation

## 2018-02-21 DIAGNOSIS — G5603 Carpal tunnel syndrome, bilateral upper limbs: Secondary | ICD-10-CM | POA: Insufficient documentation

## 2018-02-21 DIAGNOSIS — I10 Essential (primary) hypertension: Secondary | ICD-10-CM | POA: Insufficient documentation

## 2018-02-21 DIAGNOSIS — R519 Headache, unspecified: Secondary | ICD-10-CM | POA: Diagnosis present

## 2018-02-21 LAB — CBC
HCT: 39.1 % (ref 36.0–46.0)
Hemoglobin: 13 g/dL (ref 12.0–15.0)
MCH: 29.1 pg (ref 26.0–34.0)
MCHC: 33.2 g/dL (ref 30.0–36.0)
MCV: 87.7 fL (ref 78.0–100.0)
Platelets: 236 10*3/uL (ref 150–400)
RBC: 4.46 MIL/uL (ref 3.87–5.11)
RDW: 12.6 % (ref 11.5–15.5)
WBC: 8.6 10*3/uL (ref 4.0–10.5)

## 2018-02-21 LAB — I-STAT CHEM 8, ED
BUN: 17 mg/dL (ref 6–20)
Calcium, Ion: 1.05 mmol/L — ABNORMAL LOW (ref 1.15–1.40)
Chloride: 106 mmol/L (ref 101–111)
Creatinine, Ser: 0.5 mg/dL (ref 0.44–1.00)
Glucose, Bld: 99 mg/dL (ref 65–99)
HCT: 40 % (ref 36.0–46.0)
Hemoglobin: 13.6 g/dL (ref 12.0–15.0)
Potassium: 3.4 mmol/L — ABNORMAL LOW (ref 3.5–5.1)
Sodium: 141 mmol/L (ref 135–145)
TCO2: 24 mmol/L (ref 22–32)

## 2018-02-21 LAB — I-STAT TROPONIN, ED: Troponin i, poc: 0 ng/mL (ref 0.00–0.08)

## 2018-02-21 LAB — COMPREHENSIVE METABOLIC PANEL
ALT: 20 U/L (ref 14–54)
AST: 24 U/L (ref 15–41)
Albumin: 4.2 g/dL (ref 3.5–5.0)
Alkaline Phosphatase: 102 U/L (ref 38–126)
Anion gap: 8 (ref 5–15)
BUN: 16 mg/dL (ref 6–20)
CO2: 25 mmol/L (ref 22–32)
Calcium: 8.6 mg/dL — ABNORMAL LOW (ref 8.9–10.3)
Chloride: 105 mmol/L (ref 101–111)
Creatinine, Ser: 0.58 mg/dL (ref 0.44–1.00)
GFR calc Af Amer: >60 mL/min (ref >60)
GFR calc non Af Amer: >60 mL/min (ref >60)
Glucose, Bld: 96 mg/dL (ref 65–99)
Potassium: 3.6 mmol/L (ref 3.5–5.1)
Sodium: 138 mmol/L (ref 135–145)
Total Bilirubin: 1.1 mg/dL (ref 0.3–1.2)
Total Protein: 7.1 g/dL (ref 6.5–8.1)

## 2018-02-21 LAB — DIFFERENTIAL
Basophils Absolute: 0 10*3/uL (ref 0.0–0.1)
Basophils Relative: 0 %
Eosinophils Absolute: 0.1 10*3/uL (ref 0.0–0.7)
Eosinophils Relative: 1 %
Lymphocytes Relative: 26 %
Lymphs Abs: 2.3 10*3/uL (ref 0.7–4.0)
Monocytes Absolute: 0.3 10*3/uL (ref 0.1–1.0)
Monocytes Relative: 3 %
Neutro Abs: 6 10*3/uL (ref 1.7–7.7)
Neutrophils Relative %: 70 %

## 2018-02-21 LAB — I-STAT BETA HCG BLOOD, ED (MC, WL, AP ONLY): I-stat hCG, quantitative: <5 m[IU]/mL (ref <5)

## 2018-02-21 LAB — PROTIME-INR
INR: 0.96
Prothrombin Time: 12.7 seconds (ref 11.4–15.2)

## 2018-02-21 LAB — APTT: aPTT: 32 seconds (ref 24–36)

## 2018-02-21 MED ORDER — SODIUM CHLORIDE 0.9 % IV SOLN
INTRAVENOUS | Status: DC
  Administered 2018-02-21: via INTRAVENOUS

## 2018-02-21 MED ORDER — METOCLOPRAMIDE HCL 5 MG/ML IJ SOLN
10.0000 mg | Freq: Once | INTRAMUSCULAR | Status: AC
  Administered 2018-02-21: 10 mg via INTRAVENOUS
  Filled 2018-02-21: qty 2

## 2018-02-21 MED ORDER — ACETAMINOPHEN 160 MG/5ML PO SOLN: 650.0000 mg | ORAL | Status: DC | PRN

## 2018-02-21 MED ORDER — ACETAMINOPHEN 650 MG RE SUPP: 650.0000 mg | RECTAL | Status: DC | PRN

## 2018-02-21 MED ORDER — ACETAMINOPHEN 325 MG PO TABS: 650.0000 mg | ORAL_TABLET | ORAL | Status: DC | PRN

## 2018-02-21 MED ORDER — KETOROLAC TROMETHAMINE 15 MG/ML IJ SOLN
15.0000 mg | Freq: Once | INTRAMUSCULAR | Status: AC
  Administered 2018-02-21: 15 mg via INTRAVENOUS
  Filled 2018-02-21: qty 1

## 2018-02-21 MED ORDER — ENOXAPARIN SODIUM 40 MG/0.4ML ~~LOC~~ SOLN
40.0000 mg | SUBCUTANEOUS | Status: DC
  Administered 2018-02-22: 40 mg via SUBCUTANEOUS
  Filled 2018-02-21: qty 0.4

## 2018-02-21 MED ORDER — DIPHENHYDRAMINE HCL 50 MG/ML IJ SOLN
25.0000 mg | Freq: Once | INTRAMUSCULAR | Status: AC
  Administered 2018-02-21: 25 mg via INTRAVENOUS
  Filled 2018-02-21: qty 1

## 2018-02-21 NOTE — H&P (Signed)
History and Physical    Amy Aguilar GQQ:[id number] DOB: Jul 24, 1976 DOA: 02/21/2018  PCP: [place], Betty G, MD  Patient coming from: Home.  Chief Complaint: Dizziness.  HPI: Amy Aguilar is a 42 y.o. female with history of chronic headaches takes ibuprofen every month once or twice presents to the ER because of worsening dizziness with some nausea vomiting and headache.  Patient has been having dizziness for last 2 weeks.  Acutely worsened last few hours.  Patient has had workplace when patient started developing dizziness with frontal headache throbbing in nature and while talking to her daughter patient also had brief episode where she was unable to bring her words out.  Denies any weakness of the extremities or any difficulty swallowing.  Denies any visual symptoms.  Her word finding difficulty lasted for a few seconds.  Patient has been having intense dizziness on movement.  ED Course: In the ER patient was given Toradol for the headache.  CT head is unremarkable MRI brain has been ordered.  Reglan and Benadryl ordered for possible migraine.  Patient appears nonfocal.  Admitted for dizziness and headache likely from complex migraine.  Review of Systems: As per HPI, rest all negative.   Past Medical History:  Diagnosis Date  . Abnormal uterine bleeding (AUB)   . Anemia    received blood transfusion 2 wks ago  . Blood transfusion without reported diagnosis   . Headache    migraines  . Hypertension    pt states my bp is fine  . Leiomyoma of uterus   . Uterine fibroid     Past Surgical History:  Procedure Laterality Date  . ABDOMINAL HYSTERECTOMY    . BILATERAL SALPINGECTOMY Bilateral 12/05/2014   Procedure: BILATERAL SALPINGECTOMY;  Surgeon: Margarette Asal, MD;  Location[facility];  Service: Gynecology;  Laterality: Bilateral;  . LAPAROSCOPIC ASSISTED VAGINAL HYSTERECTOMY N/A 12/05/2014   Procedure: LAPAROSCOPY, ATTEMPTED VAGINAL  HYSTERECTOMY, TOTAL ABDOMINAL HYSTERECTOMY;  Surgeon: Margarette Asal, MD;  Location: [facility];  Service: Gynecology;  Laterality: N/A;  . NO PAST SURGERIES       reports that she has never smoked. She has never used smokeless tobacco. She reports that she does not drink alcohol or use drugs.  No Known Allergies  Family History  Problem Relation Age of Onset  . Hypertension Father     Prior to Admission medications   Medication Sig Start Date End Date Taking? Authorizing Provider  naproxen (NAPROSYN) 500 MG tablet Take 1 tablet (500 mg total) by mouth 2 (two) times daily. Patient not taking: Reported on 02/21/2018 07/07/17   Carlisle Cater, PA-C    Physical Exam: Vitals:   02/21/18 2045 02/21/18 2100 02/21/18 2115 02/21/18 2145  BP: 121/84 119/84 138/85 124/83  Pulse: 70 78 78 71  Resp: 15 16 16 18   Temp:      TempSrc:      SpO2: 96% 97% 98% 96%  Height:          Constitutional: Moderately built and nourished. Vitals:   02/21/18 2045 02/21/18 2100 02/21/18 2115 02/21/18 2145  BP: 121/84 119/84 138/85 124/83  Pulse: 70 78 78 71  Resp: 15 16 16 18   Temp:      TempSrc:      SpO2: 96% 97% 98% 96%  Height:       Eyes: Anicteric no pallor. ENMT: No discharge from the ears eyes nose or mouth. Neck: No mass palpated no neck rigidity no JVD appreciated.  Respiratory: No rhonchi or crepitations. Cardiovascular: S1-S2 heard no murmurs appreciated. Abdomen: Soft nontender bowel sounds present. Musculoskeletal: No edema.  No joint effusion. Skin: No rash.  Skin appears warm. Neurologic: Alert awake oriented to time place and person.  Moves all extremities 5 x 5.  No nystagmus.  No facial asymmetry.  Tongue is midline. Psychiatric: Appears normal.   Labs on Admission: I have personally reviewed following labs and imaging studies  CBC: Recent Labs  Lab 02/21/18 1800 02/21/18 1817  WBC 8.6  --   NEUTROABS 6.0  --   HGB 13.0 13.6  HCT 39.1 40.0  MCV  87.7  --   PLT 236  --    Basic Metabolic Panel: Recent Labs  Lab 02/21/18 1800 02/21/18 1817  NA 138 141  K 3.6 3.4*  CL 105 106  CO2 25  --   GLUCOSE 96 99  BUN 16 17  CREATININE 0.58 0.50  CALCIUM 8.6*  --    GFR: CrCl cannot be calculated (Unknown ideal weight.). Liver Function Tests: Recent Labs  Lab 02/21/18 1800  AST 24  ALT 20  ALKPHOS 102  BILITOT 1.1  PROT 7.1  ALBUMIN 4.2   No results for input(s): LIPASE, AMYLASE in the last 168 hours. No results for input(s): AMMONIA in the last 168 hours. Coagulation Profile: Recent Labs  Lab 02/21/18 1800  INR 0.96   Cardiac Enzymes: No results for input(s): CKTOTAL, CKMB, CKMBINDEX, TROPONINI in the last 168 hours. BNP (last 3 results) No results for input(s): PROBNP in the last 8760 hours. HbA1C: No results for input(s): HGBA1C in the last 72 hours. CBG: No results for input(s): GLUCAP in the last 168 hours. Lipid Profile: No results for input(s): CHOL, HDL, LDLCALC, TRIG, CHOLHDL, LDLDIRECT in the last 72 hours. Thyroid Function Tests: No results for input(s): TSH, T4TOTAL, FREET4, T3FREE, THYROIDAB in the last 72 hours. Anemia Panel: No results for input(s): VITAMINB12, FOLATE, FERRITIN, TIBC, IRON, RETICCTPCT in the last 72 hours. Urine analysis:    Component Value Date/Time   COLORURINE AMBER (A) 02/22/2015 0220   APPEARANCEUR TURBID (A) 02/22/2015 0220   LABSPEC 1.030 02/22/2015 0220   PHURINE 5.5 02/22/2015 0220   GLUCOSEU NEGATIVE 02/22/2015 0220   HGBUR LARGE (A) 02/22/2015 0220   BILIRUBINUR NEGATIVE 02/22/2015 0220   BILIRUBINUR neg 07/04/2012 1202   KETONESUR NEGATIVE 02/22/2015 0220   PROTEINUR 30 (A) 02/22/2015 0220   UROBILINOGEN 0.2 02/22/2015 0220   NITRITE NEGATIVE 02/22/2015 0220   LEUKOCYTESUR MODERATE (A) 02/22/2015 0220   Sepsis Labs: @LABRCNTIP (procalcitonin:4,lacticidven:4) )No results found for this or any previous visit (from the past 240 hour(s)).   Radiological Exams  on Admission: Ct Head Wo Contrast  Result Date: 02/21/2018 CLINICAL DATA:  Headache, dizziness, blurry vision for 2 days. Near syncope today. EXAM: CT HEAD WITHOUT CONTRAST TECHNIQUE: Contiguous axial images were obtained from the base of the skull through the vertex without intravenous contrast. COMPARISON:  None. FINDINGS: Brain: No evidence of acute infarction, hemorrhage, hydrocephalus, extra-axial collection or mass lesion/mass effect. Vascular: No hyperdense vessel or unexpected calcification. Skull: Cranium is intact. Sinuses/Orbits: Mastoid air cells are clear. Dense mucoid impaction in the left maxillary sinus. Other: Noncontributory IMPRESSION: No acute intracranial pathology. Electronically Signed   By: Marybelle Killings M.D.   On: 02/21/2018 19:40    EKG: Independently reviewed.  Normal sinus rhythm with nonspecific ST-T changes.  Assessment/Plan Principal Problem:   Dizziness Active Problems:   Headache    1. Dizziness -likely benign  positional vertigo.  MRI was done and I had discussed with the on-call neurologist MRI brain was unremarkable.  We will get physical therapy consult keep patient on PRN meclizine. 2. Headache with nausea vomiting and brief episode of slurred speech -discussed with on-call neurologist Dr. Lorraine Lax who at this time advised since MRI brain is negative patient likely has complex migraine.  To treat migraine with migraine cocktail.   DVT prophylaxis: Lovenox. Code Status: Full code. Family Communication: Discussed with patient. Disposition Plan: Home. Consults called: Discussed with neurologist. Admission status: Observation.   Rise Patience MD [facility] Pager [phone].  If 7PM-7AM, please contact night-coverage [email or web address] Password [facility]  02/21/2018, 10:33 PM

## 2018-02-21 NOTE — ED Notes (Signed)
Spoke with PA, not code stroke at this time. Will continue closely monitor pt and get pt to room as soon as possible. Nurse first RN notified.

## 2018-02-21 NOTE — ED Triage Notes (Addendum)
Pt has been feeling dizzy for a few days. Pt states today she developed a headache and felt nauseous. Around 1pm today, pt started having blurry vision which has now resolved. Pt also states she had trouble getting her words out around 4pm. Pt states she no longer feels like she is having trouble speaking. Pt has equal grips bilaterally. No facial droop.

## 2018-02-21 NOTE — ED Notes (Signed)
Admitting at bedside 

## 2018-02-21 NOTE — ED Notes (Signed)
Pt states when she lays down and gets right up dizziness is worse, also when she bends over.

## 2018-02-21 NOTE — ED Provider Notes (Signed)
Patient placed in Quick Look pathway, seen and evaluated   Chief Complaint: Dizziness   HPI:   42 y.o. F who presents for evaluation of dizziness that began last night.  Patient reports that last night, she began feeling dizziness that she describes as "the room spinning."  Patient states that she noticed the dizziness is worse if she moves her head or if she change positions.  She reported some associated nausea.  Patient reports today, she had some blurry vision at approximate 1 PM.  Patient reports that at 4 PM, she felt like she had some difficulty getting her words out.  Patient came to the ED.  On ED evaluation, patient denies any visual disturbance, aphasia.  Patient states that she still feels dizzy.  Patient denies any preceding trauma, injury, fall.  Patient denies any chest pain, fevers, abdominal pain, nausea/vomiting, difficulty walking, numbness/weakness of her extremities.  ROS: Dizziness  Physical Exam:   Gen: No distress  Neuro: Awake and Alert  Skin: Warm    Focused Exam:   Cranial nerves III-XII intact  Follows commands, Moves all extremities   5/5 strength to BUE and BLE   Sensation intact throughout all major nerve distributions  Normal finger to nose.  No dysdiadochokinesia.  No pronator drift.  No gait abnormalities   No slurred speech. No facial droop.    Initiation of care has begun. The patient has been counseled on the process, plan, and necessity for staying for the completion/evaluation, and the remainder of the medical screening examination    Large Artery Stroke Screening     Weakness:  Patient shows no weakness.    Vision:  NONE  Aphasia:  NONE  Neglect:  NONE:  VAN =  Negative  If patient has any weakness PLUS any one of the below: Visual Disturbance (field cut, double, or blind vision) Aphasia (inability to speak or understand) Neglect (gaze to one side or ignoring one side) This is likely a large artery clot (cortical symptoms) = VAN  Positive                 Amy Aguilar 02/21/18 2037    Mesner, Corene Cornea, MD 02/22/18 4536

## 2018-02-21 NOTE — ED Notes (Signed)
Advised Dr. Wilson Singer pt requesting pain meds

## 2018-02-22 ENCOUNTER — Observation Stay (HOSPITAL_COMMUNITY): Payer: Self-pay

## 2018-02-22 ENCOUNTER — Other Ambulatory Visit: Payer: Self-pay

## 2018-02-22 DIAGNOSIS — R51 Headache: Secondary | ICD-10-CM

## 2018-02-22 DIAGNOSIS — R42 Dizziness and giddiness: Secondary | ICD-10-CM

## 2018-02-22 LAB — CBC
HCT: 36.9 % (ref 36.0–46.0)
Hemoglobin: 12.3 g/dL (ref 12.0–15.0)
MCH: 29.2 pg (ref 26.0–34.0)
MCHC: 33.3 g/dL (ref 30.0–36.0)
MCV: 87.6 fL (ref 78.0–100.0)
Platelets: 221 10*3/uL (ref 150–400)
RBC: 4.21 MIL/uL (ref 3.87–5.11)
RDW: 13 % (ref 11.5–15.5)
WBC: 7.1 10*3/uL (ref 4.0–10.5)

## 2018-02-22 LAB — CREATININE, SERUM: CREATININE: 0.52 mg/dL (ref 0.44–1.00)

## 2018-02-22 LAB — HIV ANTIBODY (ROUTINE TESTING W REFLEX): HIV SCREEN 4TH GENERATION: NONREACTIVE

## 2018-02-22 MED ORDER — ACETAMINOPHEN 325 MG PO TABS: 650.0000 mg | ORAL_TABLET | ORAL | Status: DC | PRN

## 2018-02-22 MED ORDER — MECLIZINE HCL 25 MG PO TABS: 25.0000 mg | ORAL_TABLET | Freq: Three times a day (TID) | ORAL | 0 refills | Status: DC | PRN

## 2018-02-22 MED ORDER — MECLIZINE HCL 25 MG PO TABS: 25.0000 mg | ORAL_TABLET | Freq: Three times a day (TID) | ORAL | Status: DC | PRN

## 2018-02-22 NOTE — Progress Notes (Signed)
Patient arrived to unit and ambulated to bed. Identified appropriately and assessed. Skin intact, Vital signs WNL. Placed on telemetry and oriented to room and unit. Call bell within reach, patient instructed to call if needing to ambulate. Patient verbalized understanding. Will continue to monitor.

## 2018-02-22 NOTE — Evaluation (Signed)
Physical Therapy Evaluation Patient Details Name: Amy Aguilar MRN: [id number] DOB: 01/27/1976 Today's Date: 02/22/2018   History of Present Illness  Amy Aguilar is a 42 y.o. female with history of chronic headaches takes ibuprofen every month once or twice presents to the ER because of worsening dizziness with some nausea vomiting and headache.  Patient has been having dizziness for last 2 weeks.   Clinical Impression  Patient presents with symptoms consistent with L posterior canal BPPV and treated x 2 with canalith repositioning.  Feel she is safe for d/c home without follow up needs at this time.  Did educate not to assume future dizziness is positional if not the same symptoms.    Follow Up Recommendations No PT follow up    Equipment Recommendations  None recommended by PT    Recommendations for Other Services       Precautions / Restrictions Precautions Precautions: None      Mobility  Bed Mobility Overal bed mobility: Modified Independent                Transfers Overall transfer level: Independent                  Ambulation/Gait Ambulation/Gait assistance: Independent Ambulation Distance (Feet): 60 Feet Assistive device: None Gait Pattern/deviations: WFL(Within Functional Limits);Step-through pattern     General Gait Details: with head turns and turning in place  Stairs            Wheelchair Mobility    Modified Rankin (Stroke Patients Only)       Balance Overall balance assessment: Independent                                           Pertinent Vitals/Pain Pain Assessment: No/denies pain    Home Living Family/patient expects to be discharged to:: Private residence Living Arrangements: Children;Parent Available Help at Discharge: Family Type of Home: House Home Access: Level entry     Home Layout: One level Home Equipment: None      Prior Function Level of Independence:  Independent               Hand Dominance        Extremity/Trunk Assessment   Upper Extremity Assessment Upper Extremity Assessment: Overall WFL for tasks assessed    Lower Extremity Assessment Lower Extremity Assessment: Overall WFL for tasks assessed       Communication   Communication: No difficulties  Cognition Arousal/Alertness: Awake/alert Behavior During Therapy: WFL for tasks assessed/performed Overall Cognitive Status: Within Functional Limits for tasks assessed                                        General Comments General comments (skin integrity, edema, etc.): positive for L posterior canal BPPV and treated with canalith repositioning x 2    Exercises     Assessment/Plan    PT Assessment Patent does not need any further PT services  PT Problem List         PT Treatment Interventions      PT Goals (Current goals can be found in the Care Plan section)  Acute Rehab PT Goals PT Goal Formulation: All assessment and education complete, DC therapy    Frequency     Barriers to discharge  Co-evaluation               AM-PAC PT "6 Clicks" Daily Activity  Outcome Measure Difficulty turning over in bed (including adjusting bedclothes, sheets and blankets)?: None Difficulty moving from lying on back to sitting on the side of the bed? : None Difficulty sitting down on and standing up from a chair with arms (e.g., wheelchair, bedside commode, etc,.)?: None Help needed moving to and from a bed to chair (including a wheelchair)?: None Help needed walking in hospital room?: None Help needed climbing 3-5 steps with a railing? : None 6 Click Score: 24    End of Session   Activity Tolerance: Patient tolerated treatment well Patient left: with call bell/phone within reach;in bed   PT Visit Diagnosis: BPPV BPPV - Right/Left : Left    Time: 1410-1456 PT Time Calculation (min) (ACUTE ONLY): 46 min   Charges:   PT  Evaluation $PT Eval Moderate Complexity: 1 Mod PT Treatments $Neuromuscular Re-education: 8-22 mins $Canalith Rep Proc: 8-22 mins   PT G CodesMagda Kiel, [place] [phone] 02/22/2018   Reginia Naas 02/22/2018, 4:23 PM

## 2018-02-22 NOTE — Progress Notes (Signed)
SLP Cancellation Note  Patient Details Name: Amy Aguilar MRN: [id number] DOB: 06-02-1976   Cancelled treatment:       Reason Eval/Treat Not Completed: SLP screened, no needs identified, will sign off. MRI was negative; per chart review, symptoms felt to be related to migraine. Pt denies any subjective complaints, feeling like her cognition and communication are at baseline. No acute SLP needs identified at this time.   Germain Osgood 02/22/2018, 10:15 AM  Germain Osgood, M.A. CCC-SLP [phone]

## 2018-02-22 NOTE — Progress Notes (Signed)
Amy Aguilar to be D/C'd Home per MD order.  Discussed with the patient and all questions fully answered.  VSS, Skin clean, dry and intact without evidence of skin break down, no evidence of skin tears noted. IV catheter discontinued intact. Site without signs and symptoms of complications. Dressing and pressure applied.  An After Visit Summary was printed and given to the patient. Patient received prescription.  D/c education completed with patient/family including follow up instructions, medication list, d/c activities limitations if indicated, with other d/c instructions as indicated by MD - patient able to verbalize understanding, all questions fully answered.   Patient instructed to return to ED, call 911, or call MD for any changes in condition.   Patient escorted via [facility], and D/C home via private auto.  [name] 02/22/2018 3:00 PM

## 2018-02-22 NOTE — Discharge Summary (Signed)
Physician Discharge Summary  Amy Aguilar SWF:[id number] DOB: March 31, 1976 DOA: 02/21/2018  PCP: Martinique, Betty G, MD  Admit date: 02/21/2018 Discharge date: 02/22/2018   Recommendations for Outpatient Follow-Up:   1. Neurology referral for headaches   Discharge Diagnosis:   Principal Problem:   Dizziness Active Problems:   Headache   Discharge disposition:  Home.  Discharge Condition: Improved.  Diet recommendation:   Regular.  Wound care: None.   History of Present Illness:   Amy Aguilar is a 42 y.o. female with history of chronic headaches takes ibuprofen every month once or twice presents to the ER because of worsening dizziness with some nausea vomiting and headache.  Patient has been having dizziness for last 2 weeks.  Acutely worsened last few hours.  Patient has had workplace when patient started developing dizziness with frontal headache throbbing in nature and while talking to her daughter patient also had brief episode where she was unable to bring her words out.  Denies any weakness of the extremities or any difficulty swallowing.  Denies any visual symptoms.  Her word finding difficulty lasted for a few seconds.  Patient has been having intense dizziness on movement.  ED Course: In the ER patient was given Toradol for the headache.  CT head is unremarkable MRI brain has been ordered.  Reglan and Benadryl ordered for possible migraine.  Patient appears nonfocal.  Admitted for dizziness and headache likely from complex migraine.     Hospital Course by Problem:   Dizziness - -MRI negative -ambulate patient -resolved  Headache with nausea vomiting and brief episode of slurred speech  -this was discussed with on-call neurologist Dr. Lorraine Lax who at this time advised since MRI brain is negative patient likely has complex migraine -improved with migraine cocktail -referral to neurology       Medical Consultants:    Neuro  (phone)   Discharge Exam:   Vitals:   02/22/18 0750 02/22/18 1017  BP: 107/70   Pulse: 74 76  Resp: 18 20  Temp: 98.2 F (36.8 C) 98.2 F (36.8 C)  SpO2:  98%   Vitals:   02/22/18 0600 02/22/18 0601 02/22/18 0750 02/22/18 1017  BP: 107/70  107/70   Pulse: 75 74 74 76  Resp: 18  18 20   Temp: 98.2 F (36.8 C)  98.2 F (36.8 C) 98.2 F (36.8 C)  TempSrc:   Oral Oral  SpO2: 100% 99%  98%  Height:   5\' 2"  (1.575 m)     Gen:  NAD-- feels better-- no further dizziness   The results of significant diagnostics from this hospitalization (including imaging, microbiology, ancillary and laboratory) are listed below for reference.     Procedures and Diagnostic Studies:   Ct Head Wo Contrast  Result Date: 02/21/2018 CLINICAL DATA:  Headache, dizziness, blurry vision for 2 days. Near syncope today. EXAM: CT HEAD WITHOUT CONTRAST TECHNIQUE: Contiguous axial images were obtained from the base of the skull through the vertex without intravenous contrast. COMPARISON:  None. FINDINGS: Brain: No evidence of acute infarction, hemorrhage, hydrocephalus, extra-axial collection or mass lesion/mass effect. Vascular: No hyperdense vessel or unexpected calcification. Skull: Cranium is intact. Sinuses/Orbits: Mastoid air cells are clear. Dense mucoid impaction in the left maxillary sinus. Other: Noncontributory IMPRESSION: No acute intracranial pathology. Electronically Signed   By: Marybelle Killings M.D.   On: 02/21/2018 19:40   Mr Brain Wo Contrast  Result Date: 02/22/2018 CLINICAL DATA:  Dizziness EXAM: MRI HEAD WITHOUT CONTRAST TECHNIQUE: Multiplanar, multiecho pulse sequences  of the brain and surrounding structures were obtained without intravenous contrast. COMPARISON:  Head CT 02/21/2018 FINDINGS: BRAIN: The midline structures are normal. There is no acute infarct or acute hemorrhage. No mass lesion, hydrocephalus, dural abnormality or extra-axial collection. The brain parenchymal signal is normal for  the patient's age. No age-advanced or lobar predominant atrophy. No chronic microhemorrhage or superficial siderosis. VASCULAR: Major intracranial arterial and venous sinus flow voids are preserved. SKULL AND UPPER CERVICAL SPINE: The visualized skull base, calvarium, upper cervical spine and extracranial soft tissues are normal. SINUSES/ORBITS: Left maxillary sinus retention cyst. No mastoid or middle ear effusion. Normal orbits. IMPRESSION: Normal brain. Electronically Signed   By: Ulyses Jarred M.D.   On: 02/22/2018 01:57     Labs:   Basic Metabolic Panel: Recent Labs  Lab 02/21/18 1800 02/21/18 1817 02/21/18 2345  NA 138 141  --   K 3.6 3.4*  --   CL 105 106  --   CO2 25  --   --   GLUCOSE 96 99  --   BUN 16 17  --   CREATININE 0.58 0.50 0.52  CALCIUM 8.6*  --   --    GFR CrCl cannot be calculated (Unknown ideal weight.). Liver Function Tests: Recent Labs  Lab 02/21/18 1800  AST 24  ALT 20  ALKPHOS 102  BILITOT 1.1  PROT 7.1  ALBUMIN 4.2   No results for input(s): LIPASE, AMYLASE in the last 168 hours. No results for input(s): AMMONIA in the last 168 hours. Coagulation profile Recent Labs  Lab 02/21/18 1800  INR 0.96    CBC: Recent Labs  Lab 02/21/18 1800 02/21/18 1817 02/21/18 2345  WBC 8.6  --  7.1  NEUTROABS 6.0  --   --   HGB 13.0 13.6 12.3  HCT 39.1 40.0 36.9  MCV 87.7  --  87.6  PLT 236  --  221   Cardiac Enzymes: No results for input(s): CKTOTAL, CKMB, CKMBINDEX, TROPONINI in the last 168 hours. BNP: Invalid input(s): POCBNP CBG: No results for input(s): GLUCAP in the last 168 hours. D-Dimer No results for input(s): DDIMER in the last 72 hours. Hgb A1c No results for input(s): HGBA1C in the last 72 hours. Lipid Profile No results for input(s): CHOL, HDL, LDLCALC, TRIG, CHOLHDL, LDLDIRECT in the last 72 hours. Thyroid function studies No results for input(s): TSH, T4TOTAL, T3FREE, THYROIDAB in the last 72 hours.  Invalid input(s):  FREET3 Anemia work up No results for input(s): VITAMINB12, FOLATE, FERRITIN, TIBC, IRON, RETICCTPCT in the last 72 hours. Microbiology No results found for this or any previous visit (from the past 240 hour(s)).   Discharge Instructions:   Discharge Instructions    Ambulatory referral to Neurology   Complete by:  As directed    An appointment is requested in approximately: 3-4 weeks re: migraines   Diet general   Complete by:  As directed    Increase activity slowly   Complete by:  As directed      Allergies as of 02/22/2018   No Known Allergies     Medication List    STOP taking these medications   naproxen 500 MG tablet Commonly known as:  NAPROSYN     TAKE these medications   acetaminophen 325 MG tablet Commonly known as:  TYLENOL Take 2 tablets (650 mg total) by mouth every 4 (four) hours as needed for mild pain (or temp > 37.5 C (99.5 F)).   meclizine 25 MG tablet Commonly known  as:  ANTIVERT Take 1 tablet (25 mg total) by mouth 3 (three) times daily as needed for dizziness.      Follow-up Information    Martinique, Betty G, MD Follow up in 1 week(s).   Specialty:  Family Medicine Contact information: [place] [id number] [phone]            Time coordinating discharge: 25 min  Signed:  Geradine Girt   [facility] 02/22/2018, 2:07 PM

## 2018-02-23 ENCOUNTER — Telehealth: Payer: Self-pay | Admitting: *Deleted

## 2018-02-23 NOTE — Telephone Encounter (Signed)
Transition Care Management Follow-up Telephone Call  Per Discharge Summary: Admit date: 02/21/2018 Discharge date: 02/22/2018   Recommendations for Outpatient Follow-Up:   1. Neurology referral for headaches   Discharge Diagnosis:   Principal Problem:   Dizziness Active Problems:   Headache   Discharge disposition:  Home.  Discharge Condition: Improved.  Diet recommendation:   Regular.  --   How have you been since you were released from the hospital? "Okay."   Do you understand why you were in the hospital? yes   Do you understand the discharge instructions?  yes   Where were you discharged to? Home   Items Reviewed:  Medications reviewed: yes  Allergies reviewed: yes  Dietary changes reviewed: no  Referrals reviewed: yes   Functional Questionnaire:   Activities of Daily Living (ADLs):   She states they are independent in the following: ambulation, bathing and hygiene, feeding, continence, grooming, toileting and dressing States they require assistance with the following: none   Any transportation issues/concerns?: no   Any patient concerns? no   Confirmed importance and date/time of follow-up visits scheduled yes  Provider Appointment booked with Dr. Betty Martinique 02/28/2018 @ 3:30pm  Confirmed with patient if condition begins to worsen call PCP or go to the ER.  Patient was given the office number and encouraged to call back with question or concerns.  : yes

## 2018-02-23 NOTE — ED Provider Notes (Signed)
[name] PROGRESSIVE CARE Provider Note   CSN: [id number] Arrival date & time: 02/21/18  1703     History   Chief Complaint Chief Complaint  Patient presents with  . Dizziness    HPI Amy Aguilar is a 42 y.o. female.  HPI   42 year old female with dizziness.  She describes a sensation of room spinning.  Worse on movement.  Throughout the day she began developing headaches and blurred vision approximately 1 PM.  She reports that this is been persistent throughout the day.  Shortly after 4 PM she was talking with her daughter and was having difficulty saying what she wanted to say.  This lasted about 10 minutes and has resolved.  Has not recurred since then.  Denies any acute visual complaints.  No tenderness.  Past Medical History:  Diagnosis Date  . Abnormal uterine bleeding (AUB)   . Anemia    received blood transfusion 2 wks ago  . Blood transfusion without reported diagnosis   . Headache    migraines  . Hypertension    pt states my bp is fine  . Leiomyoma of uterus   . Uterine fibroid     Patient Active Problem List   Diagnosis Date Noted  . Dizziness 02/21/2018  . Headache 02/21/2018  . Varicose veins of bilateral lower extremities with pain 04/13/2017  . Class 1 obesity without serious comorbidity with body mass index (BMI) of 34.0 to 34.9 in adult 04/13/2017  . Carpal tunnel syndrome, left 09/14/2016  . Carpal tunnel syndrome, right 09/14/2016  . Fibroids 12/05/2014  . Leiomyoma of uterus 11/21/2014  . Anemia 11/21/2014  . Menorrhagia 09/18/2012    Past Surgical History:  Procedure Laterality Date  . ABDOMINAL HYSTERECTOMY    . BILATERAL SALPINGECTOMY Bilateral 12/05/2014   Procedure: BILATERAL SALPINGECTOMY;  Surgeon: Margarette Asal, MD;  Location: [facility];  Service: Gynecology;  Laterality: Bilateral;  . LAPAROSCOPIC ASSISTED VAGINAL HYSTERECTOMY N/A 12/05/2014   Procedure: LAPAROSCOPY, ATTEMPTED VAGINAL  HYSTERECTOMY, TOTAL ABDOMINAL HYSTERECTOMY;  Surgeon: Margarette Asal, MD;  Location: [facility];  Service: Gynecology;  Laterality: N/A;  . NO PAST SURGERIES       OB History    Gravida  4   Para  4   Term  4   Preterm  0   AB  0   Living  5     SAB  0   TAB  0   Ectopic  0   Multiple  1   Live Births  5            Home Medications    Prior to Admission medications   Medication Sig Start Date End Date Taking? Authorizing Provider  acetaminophen (TYLENOL) 325 MG tablet Take 2 tablets (650 mg total) by mouth every 4 (four) hours as needed for mild pain (or temp > 37.5 C (99.5 F)). 02/22/18   Geradine Girt, DO  meclizine (ANTIVERT) 25 MG tablet Take 1 tablet (25 mg total) by mouth 3 (three) times daily as needed for dizziness. 02/22/18   Geradine Girt, DO    Family History Family History  Problem Relation Age of Onset  . Hypertension Father     Social History Social History   Tobacco Use  . Smoking status: Never Smoker  . Smokeless tobacco: Never Used  Substance Use Topics  . Alcohol use: No  . Drug use: No     Allergies   Patient has no  known allergies.   Review of Systems Review of Systems  All systems reviewed and negative, other than as noted in HPI.  Physical Exam Updated Vital Signs BP 117/71 (BP Location: Right Arm)   Pulse 78   Temp 98.1 F (36.7 C) (Oral)   Resp 20   Ht 5\' 2"  (1.575 m)   LMP 11/21/2014   SpO2 98%   BMI 32.92 kg/m   Physical Exam  Constitutional: She is oriented to person, place, and time. She appears well-developed and well-nourished. No distress.  HENT:  Head: Normocephalic and atraumatic.  Eyes: Conjunctivae are normal. Right eye exhibits no discharge. Left eye exhibits no discharge.  Neck: Neck supple.  Cardiovascular: Normal rate, regular rhythm and normal heart sounds. Exam reveals no gallop and no friction rub.  No murmur heard. Pulmonary/Chest: Effort normal and breath sounds  normal. No respiratory distress.  Abdominal: Soft. She exhibits no distension. There is no tenderness.  Musculoskeletal: She exhibits no edema or tenderness.  Neurological: She is alert and oriented to person, place, and time. No cranial nerve deficit. She exhibits normal muscle tone. Coordination normal.  Speech clear.  Content appropriate.  Follows commands.  Cranial nerves II through XII intact.  Strength is 5 out of 5 bilateral upper and lower extremities.  Sensation is intact to light touch.  Skin: Skin is warm and dry.  Psychiatric: She has a normal mood and affect. Her behavior is normal. Thought content normal.  Nursing note and vitals reviewed.    ED Treatments / Results  Labs (all labs ordered are listed, but only abnormal results are displayed) Labs Reviewed  COMPREHENSIVE METABOLIC PANEL - Abnormal; Notable for the following components:      Result Value   Calcium 8.6 (*)    All other components within normal limits  I-STAT CHEM 8, ED - Abnormal; Notable for the following components:   Potassium 3.4 (*)    Calcium, Ion 1.05 (*)    All other components within normal limits  PROTIME-INR  APTT  CBC  DIFFERENTIAL  HIV ANTIBODY (ROUTINE TESTING)  CBC  CREATININE, SERUM  I-STAT TROPONIN, ED  CBG MONITORING, ED  I-STAT BETA HCG BLOOD, ED ([facility], [facility], [facility] ONLY)    EKG EKG Interpretation  Date/Time:  Tuesday February 21 2018 17:50:46 EDT Ventricular Rate:  80 PR Interval:  146 QRS Duration: 80 QT Interval:  410 QTC Calculation: 472 R Axis:   63 Text Interpretation:  Normal sinus rhythm Non-specific ST-t changes Confirmed by Virgel Manifold [phone]) on 02/21/2018 9:42:23 PM Also confirmed by Virgel Manifold [phone]), editor Hattie Perch ([id number])  on 02/22/2018 7:12:56 AM   Radiology Mr Brain Wo Contrast  Result Date: 02/22/2018 CLINICAL DATA:  Dizziness EXAM: MRI HEAD WITHOUT CONTRAST TECHNIQUE: Multiplanar, multiecho pulse sequences of the brain and surrounding structures  were obtained without intravenous contrast. COMPARISON:  Head CT 02/21/2018 FINDINGS: BRAIN: The midline structures are normal. There is no acute infarct or acute hemorrhage. No mass lesion, hydrocephalus, dural abnormality or extra-axial collection. The brain parenchymal signal is normal for the patient's age. No age-advanced or lobar predominant atrophy. No chronic microhemorrhage or superficial siderosis. VASCULAR: Major intracranial arterial and venous sinus flow voids are preserved. SKULL AND UPPER CERVICAL SPINE: The visualized skull base, calvarium, upper cervical spine and extracranial soft tissues are normal. SINUSES/ORBITS: Left maxillary sinus retention cyst. No mastoid or middle ear effusion. Normal orbits. IMPRESSION: Normal brain. Electronically Signed   By: Ulyses Jarred M.D.   On: 02/22/2018 01:57  Procedures Procedures (including critical care time)  Medications Ordered in ED Medications  ketorolac (TORADOL) 15 MG/ML injection 15 mg (15 mg Intravenous Given 02/21/18 2208)  metoCLOPramide (REGLAN) injection 10 mg (10 mg Intravenous Given 02/21/18 2348)  diphenhydrAMINE (BENADRYL) injection 25 mg (25 mg Intravenous Given 02/21/18 2351)     Initial Impression / Assessment and Plan / ED Course  I have reviewed the triage vital signs and the nursing notes.  Pertinent labs & imaging results that were available during my care of the patient were reviewed by me and considered in my medical decision making (see chart for details).     42 year old female with what sounds like vertigo.  She also describes other symptoms such as expressive a aphasia versus concerning though.  Concern for possible TIA.  Her neuro exam is nonfocal.  CT the head is without acute abnormality.  Will admit for TIA work-up.  Final Clinical Impressions(s) / ED Diagnoses   Final diagnoses:  Acute nonintractable headache, unspecified headache type    ED Discharge Orders        Ordered    acetaminophen  (TYLENOL) 325 MG tablet  Every 4 hours PRN     02/22/18 1407    meclizine (ANTIVERT) 25 MG tablet  3 times daily PRN     02/22/18 1407    Increase activity slowly     02/22/18 1407    Ambulatory referral to Neurology    Comments:  An appointment is requested in approximately: 3-4 weeks re: migraines   02/22/18 1407    Diet general     02/22/18 1407       Virgel Manifold, MD 02/23/18 2316

## 2018-02-27 NOTE — Progress Notes (Deleted)
HPI:   Amy Aguilar is a 42 y.o. female, who is here today to follow on recent hospitalization.   She was admitted from 5/30 to 02/22/2018. She presented to the ER with worsening dizziness,nausea,and headache. Frontal throbbing headache.  Head CT:No acute intracranial pathology.  Brain MRI 02/22/18:Normal.  Lab Results  Component Value Date   CREATININE 0.52 02/21/2018   BUN 17 02/21/2018   NA 141 02/21/2018   K 3.4 (L) 02/21/2018   CL 106 02/21/2018   CO2 25 02/21/2018   Lab Results  Component Value Date   WBC 7.1 02/21/2018   HGB 12.3 02/21/2018   HCT 36.9 02/21/2018   MCV 87.6 02/21/2018   PLT 221 02/21/2018    Ca++ 8.6 (alb 4.2).   Referred to neurologist.         Review of Systems    Current Outpatient Medications on File Prior to Visit  Medication Sig Dispense Refill  . acetaminophen (TYLENOL) 325 MG tablet Take 2 tablets (650 mg total) by mouth every 4 (four) hours as needed for mild pain (or temp > 37.5 C (99.5 F)).    Marland Kitchen meclizine (ANTIVERT) 25 MG tablet Take 1 tablet (25 mg total) by mouth 3 (three) times daily as needed for dizziness. 15 tablet 0   No current facility-administered medications on file prior to visit.      Past Medical History:  Diagnosis Date  . Abnormal uterine bleeding (AUB)   . Anemia    received blood transfusion 2 wks ago  . Blood transfusion without reported diagnosis   . Headache    migraines  . Hypertension    pt states my bp is fine  . Leiomyoma of uterus   . Uterine fibroid    No Known Allergies  Social History   Socioeconomic History  . Marital status: Divorced    Spouse name: Not on file  . Number of children: Not on file  . Years of education: Not on file  . Highest education level: Not on file  Occupational History  . Not on file  Social Needs  . Financial resource strain: Not on file  . Food insecurity:    Worry: Not on file    Inability: Not on file  . Transportation  needs:    Medical: Not on file    Non-medical: Not on file  Tobacco Use  . Smoking status: Never Smoker  . Smokeless tobacco: Never Used  Substance and Sexual Activity  . Alcohol use: No  . Drug use: No  . Sexual activity: Yes    Comment: none now  Lifestyle  . Physical activity:    Days per week: Not on file    Minutes per session: Not on file  . Stress: Not on file  Relationships  . Social connections:    Talks on phone: Not on file    Gets together: Not on file    Attends religious service: Not on file    Active member of club or organization: Not on file    Attends meetings of clubs or organizations: Not on file    Relationship status: Not on file  Other Topics Concern  . Not on file  Social History Narrative   ** Merged History Encounter **       Marital status: married x 17 years; happily married.  From [place]; moved to [place] 2000. Children: 5 children; no grandchildren.  Living: husband, 5 children. Employment: homemaker.    There were  no vitals filed for this visit. There is no height or weight on file to calculate BMI.      Physical Exam  ASSESSMENT AND PLAN:  There are no diagnoses linked to this encounter.   No orders of the defined types were placed in this encounter.   No problem-specific Assessment & Plan notes found for this encounter.      Alphonsine Minium G. Martinique, MD  [facility] [place] office.

## 2018-02-28 ENCOUNTER — Inpatient Hospital Stay: Payer: Commercial Managed Care - PPO | Admitting: Family Medicine

## 2018-02-28 DIAGNOSIS — Z0289 Encounter for other administrative examinations: Secondary | ICD-10-CM

## 2018-06-16 ENCOUNTER — Ambulatory Visit (INDEPENDENT_AMBULATORY_CARE_PROVIDER_SITE_OTHER): Payer: BLUE CROSS/BLUE SHIELD | Admitting: Orthopaedic Surgery

## 2018-06-16 ENCOUNTER — Encounter (INDEPENDENT_AMBULATORY_CARE_PROVIDER_SITE_OTHER): Payer: Self-pay | Admitting: Orthopaedic Surgery

## 2018-06-16 DIAGNOSIS — G5601 Carpal tunnel syndrome, right upper limb: Secondary | ICD-10-CM

## 2018-06-16 DIAGNOSIS — G5602 Carpal tunnel syndrome, left upper limb: Secondary | ICD-10-CM | POA: Diagnosis not present

## 2018-06-16 NOTE — Progress Notes (Signed)
Office Visit Note   Patient: Amy Aguilar           Date of Birth: [birth year]           MRN: [id number] Visit Date: 06/16/2018              Requested by: Martinique, Betty G, MD [address] [place], [place] [address] PCP: Martinique, Betty G, MD   Assessment & Plan: Visit Diagnoses:  1. Carpal tunnel syndrome, right   2. Carpal tunnel syndrome, left     Plan: [name] has moderate right greater than left carpal tunnel syndrome.  At this point she wishes to proceed with a right carpal tunnel release after discussion of risks and benefits and postoperative rehab and recovery.  Anticipate out of work for 2 to 4 weeks depending on how her symptoms are postoperatively.  We will get her scheduled in the near future.  Follow-Up Instructions: Return if symptoms worsen or fail to improve.   Orders:  No orders of the defined types were placed in this encounter.  No orders of the defined types were placed in this encounter.     Procedures: No procedures performed   Clinical Data: No additional findings.   Subjective: Chief Complaint  Patient presents with  . Right Wrist - Pain  . Left Wrist - Pain    Mrs. Amy Aguilar returns today for bilateral carpal tunnel syndrome worse on the right.  She previously had these injected about a year ago which did give her significant relief but this has returned and she is back to work which makes it worse.  Denies any changes in medical history.   Review of Systems  Constitutional: Negative.   HENT: Negative.   Eyes: Negative.   Respiratory: Negative.   Cardiovascular: Negative.   Endocrine: Negative.   Musculoskeletal: Negative.   Neurological: Negative.   Hematological: Negative.   Psychiatric/Behavioral: Negative.   All other systems reviewed and are negative.    Objective: Vital Signs: LMP 11/21/2014   Physical Exam  Constitutional: She is oriented to person, place, and time. She appears well-developed and  well-nourished.  Pulmonary/Chest: Effort normal.  Neurological: She is alert and oriented to person, place, and time.  Skin: Skin is warm. Capillary refill takes less than 2 seconds.  Psychiatric: She has a normal mood and affect. Her behavior is normal. Judgment and thought content normal.  Nursing note and vitals reviewed.   Ortho Exam Bilateral hand exams are stable.  Positive carpal tunnel compressive signs. Specialty Comments:  No specialty comments available.  Imaging: No results found.   PMFS History: Patient Active Problem List   Diagnosis Date Noted  . Dizziness 02/21/2018  . Headache 02/21/2018  . Varicose veins of bilateral lower extremities with pain 04/13/2017  . Class 1 obesity without serious comorbidity with body mass index (BMI) of 34.0 to 34.9 in adult 04/13/2017  . Carpal tunnel syndrome, left 09/14/2016  . Carpal tunnel syndrome, right 09/14/2016  . Fibroids 12/05/2014  . Leiomyoma of uterus 11/21/2014  . Anemia 11/21/2014  . Menorrhagia 09/18/2012   Past Medical History:  Diagnosis Date  . Abnormal uterine bleeding (AUB)   . Anemia    received blood transfusion 2 wks ago  . Blood transfusion without reported diagnosis   . Headache    migraines  . Hypertension    pt states my bp is fine  . Leiomyoma of uterus   . Uterine fibroid     Family History  Problem  Relation Age of Onset  . Hypertension Father     Past Surgical History:  Procedure Laterality Date  . ABDOMINAL HYSTERECTOMY    . BILATERAL SALPINGECTOMY Bilateral 12/05/2014   Procedure: BILATERAL SALPINGECTOMY;  Surgeon: Margarette Asal, MD;  Location: [facility];  Service: Gynecology;  Laterality: Bilateral;  . LAPAROSCOPIC ASSISTED VAGINAL HYSTERECTOMY N/A 12/05/2014   Procedure: LAPAROSCOPY, ATTEMPTED VAGINAL HYSTERECTOMY, TOTAL ABDOMINAL HYSTERECTOMY;  Surgeon: Margarette Asal, MD;  Location: [facility];  Service: Gynecology;  Laterality: N/A;  . NO  PAST SURGERIES     Social History   Occupational History  . Not on file  Tobacco Use  . Smoking status: Never Smoker  . Smokeless tobacco: Never Used  Substance and Sexual Activity  . Alcohol use: No  . Drug use: No  . Sexual activity: Yes    Comment: none now

## 2018-06-27 ENCOUNTER — Encounter (HOSPITAL_BASED_OUTPATIENT_CLINIC_OR_DEPARTMENT_OTHER): Payer: Self-pay | Admitting: *Deleted

## 2018-06-27 ENCOUNTER — Other Ambulatory Visit: Payer: Self-pay

## 2018-06-28 ENCOUNTER — Other Ambulatory Visit: Payer: Self-pay

## 2018-06-28 ENCOUNTER — Encounter (HOSPITAL_BASED_OUTPATIENT_CLINIC_OR_DEPARTMENT_OTHER): Payer: Self-pay | Admitting: *Deleted

## 2018-06-28 ENCOUNTER — Encounter (HOSPITAL_BASED_OUTPATIENT_CLINIC_OR_DEPARTMENT_OTHER)
Admission: RE | Disposition: A | Discharge status: Home / Self Care | Payer: Self-pay | Source: Ambulatory Visit | Attending: Orthopaedic Surgery

## 2018-06-28 ENCOUNTER — Ambulatory Visit (HOSPITAL_BASED_OUTPATIENT_CLINIC_OR_DEPARTMENT_OTHER): Payer: BLUE CROSS/BLUE SHIELD | Admitting: Anesthesiology

## 2018-06-28 ENCOUNTER — Ambulatory Visit (HOSPITAL_BASED_OUTPATIENT_CLINIC_OR_DEPARTMENT_OTHER)
Admission: RE | Admit: 2018-06-28 | Discharge: 2018-06-28 | Disposition: A | Discharge status: Home / Self Care | Payer: BLUE CROSS/BLUE SHIELD | Source: Ambulatory Visit | Attending: Orthopaedic Surgery | Admitting: Orthopaedic Surgery

## 2018-06-28 DIAGNOSIS — I739 Peripheral vascular disease, unspecified: Secondary | ICD-10-CM | POA: Diagnosis not present

## 2018-06-28 DIAGNOSIS — I10 Essential (primary) hypertension: Secondary | ICD-10-CM | POA: Insufficient documentation

## 2018-06-28 DIAGNOSIS — G5601 Carpal tunnel syndrome, right upper limb: Secondary | ICD-10-CM | POA: Diagnosis not present

## 2018-06-28 DIAGNOSIS — Z79899 Other long term (current) drug therapy: Secondary | ICD-10-CM | POA: Insufficient documentation

## 2018-06-28 HISTORY — PX: CARPAL TUNNEL RELEASE: SHX101

## 2018-06-28 SURGERY — CARPAL TUNNEL RELEASE
Anesthesia: Regional | Site: Wrist | Laterality: Right

## 2018-06-28 MED ORDER — PROPOFOL 500 MG/50ML IV EMUL
INTRAVENOUS | Status: DC | PRN
  Administered 2018-06-28: 75 ug/kg/min via INTRAVENOUS

## 2018-06-28 MED ORDER — LIDOCAINE 2% (20 MG/ML) 5 ML SYRINGE
INTRAMUSCULAR | Status: AC
  Filled 2018-06-28: qty 5

## 2018-06-28 MED ORDER — OXYCODONE HCL 5 MG/5ML PO SOLN: 5.0000 mg | Freq: Once | ORAL | Status: DC | PRN

## 2018-06-28 MED ORDER — HYDROCODONE-ACETAMINOPHEN 5-325 MG PO TABS: 1.0000 | ORAL_TABLET | Freq: Four times a day (QID) | ORAL | 0 refills | Status: DC | PRN

## 2018-06-28 MED ORDER — CHLORHEXIDINE GLUCONATE 4 % EX LIQD: 60.0000 mL | Freq: Once | CUTANEOUS | Status: DC

## 2018-06-28 MED ORDER — FENTANYL CITRATE (PF) 100 MCG/2ML IJ SOLN
INTRAMUSCULAR | Status: AC
  Filled 2018-06-28: qty 2

## 2018-06-28 MED ORDER — CEFAZOLIN SODIUM-DEXTROSE 2-4 GM/100ML-% IV SOLN
2.0000 g | INTRAVENOUS | Status: AC
  Administered 2018-06-28: 2 g via INTRAVENOUS

## 2018-06-28 MED ORDER — MIDAZOLAM HCL 2 MG/2ML IJ SOLN: 1.0000 mg | INTRAMUSCULAR | Status: DC | PRN

## 2018-06-28 MED ORDER — LIDOCAINE HCL (PF) 0.5 % IJ SOLN
INTRAMUSCULAR | Status: DC | PRN
  Administered 2018-06-28: 35 mL via INTRAVENOUS

## 2018-06-28 MED ORDER — LIDOCAINE HCL (CARDIAC) PF 100 MG/5ML IV SOSY
PREFILLED_SYRINGE | INTRAVENOUS | Status: DC | PRN
  Administered 2018-06-28: 40 mg via INTRAVENOUS

## 2018-06-28 MED ORDER — ONDANSETRON HCL 4 MG/2ML IJ SOLN
INTRAMUSCULAR | Status: AC
  Filled 2018-06-28: qty 2

## 2018-06-28 MED ORDER — PROMETHAZINE HCL 25 MG/ML IJ SOLN: 6.2500 mg | INTRAMUSCULAR | Status: DC | PRN

## 2018-06-28 MED ORDER — ONDANSETRON HCL 4 MG/2ML IJ SOLN
INTRAMUSCULAR | Status: DC | PRN
  Administered 2018-06-28: 4 mg via INTRAVENOUS

## 2018-06-28 MED ORDER — FENTANYL CITRATE (PF) 100 MCG/2ML IJ SOLN: 50.0000 ug | INTRAMUSCULAR | Status: DC | PRN

## 2018-06-28 MED ORDER — LACTATED RINGERS IV SOLN
INTRAVENOUS | Status: DC
  Administered 2018-06-28 (×2): via INTRAVENOUS

## 2018-06-28 MED ORDER — BUPIVACAINE HCL (PF) 0.25 % IJ SOLN
INTRAMUSCULAR | Status: DC | PRN
  Administered 2018-06-28: 8 mL

## 2018-06-28 MED ORDER — LACTATED RINGERS IV SOLN: INTRAVENOUS | Status: DC

## 2018-06-28 MED ORDER — OXYCODONE HCL 5 MG PO TABS: 5.0000 mg | ORAL_TABLET | Freq: Once | ORAL | Status: DC | PRN

## 2018-06-28 MED ORDER — SCOPOLAMINE 1 MG/3DAYS TD PT72: 1.0000 | MEDICATED_PATCH | Freq: Once | TRANSDERMAL | Status: DC | PRN

## 2018-06-28 MED ORDER — MEPERIDINE HCL 25 MG/ML IJ SOLN: 6.2500 mg | INTRAMUSCULAR | Status: DC | PRN

## 2018-06-28 MED ORDER — MIDAZOLAM HCL 5 MG/5ML IJ SOLN
INTRAMUSCULAR | Status: DC | PRN
  Administered 2018-06-28: 2 mg via INTRAVENOUS

## 2018-06-28 MED ORDER — FENTANYL CITRATE (PF) 100 MCG/2ML IJ SOLN
25.0000 ug | INTRAMUSCULAR | Status: DC | PRN
  Administered 2018-06-28: 50 ug via INTRAVENOUS

## 2018-06-28 MED ORDER — PROMETHAZINE HCL 25 MG PO TABS: 25.0000 mg | ORAL_TABLET | Freq: Four times a day (QID) | ORAL | 1 refills | Status: DC | PRN

## 2018-06-28 MED ORDER — PROPOFOL 10 MG/ML IV BOLUS
INTRAVENOUS | Status: DC | PRN
  Administered 2018-06-28: 30 mg via INTRAVENOUS
  Administered 2018-06-28: 20 mg via INTRAVENOUS
  Administered 2018-06-28: 30 mg via INTRAVENOUS

## 2018-06-28 MED ORDER — FENTANYL CITRATE (PF) 100 MCG/2ML IJ SOLN
INTRAMUSCULAR | Status: DC | PRN
  Administered 2018-06-28 (×2): 50 ug via INTRAVENOUS

## 2018-06-28 MED ORDER — MIDAZOLAM HCL 2 MG/2ML IJ SOLN
INTRAMUSCULAR | Status: AC
  Filled 2018-06-28: qty 2

## 2018-06-28 MED ORDER — DEXAMETHASONE SODIUM PHOSPHATE 10 MG/ML IJ SOLN
INTRAMUSCULAR | Status: AC
  Filled 2018-06-28: qty 1

## 2018-06-28 SURGICAL SUPPLY — 49 items
BANDAGE ACE 3X5.8 VEL STRL LF (GAUZE/BANDAGES/DRESSINGS) ×3 IMPLANT
BLADE MINI RND TIP GREEN BEAV (BLADE) ×3 IMPLANT
BLADE SURG 15 STRL LF DISP TIS (BLADE) ×1 IMPLANT
BLADE SURG 15 STRL SS (BLADE) ×3
BNDG CMPR 9X4 STRL LF SNTH (GAUZE/BANDAGES/DRESSINGS) ×1
BNDG ESMARK 4X9 LF (GAUZE/BANDAGES/DRESSINGS) ×3 IMPLANT
BNDG PLASTER X FAST 3X3 WHT LF (CAST SUPPLIES) ×10 IMPLANT
BNDG PLSTR 9X3 FST ST WHT (CAST SUPPLIES)
BRUSH SCRUB EZ PLAIN DRY (MISCELLANEOUS) ×3 IMPLANT
CANISTER SUCT 1200ML W/VALVE (MISCELLANEOUS) ×1 IMPLANT
CORD BIPOLAR FORCEPS 12FT (ELECTRODE) ×3 IMPLANT
COVER BACK TABLE 60X90IN (DRAPES) ×3 IMPLANT
COVER MAYO STAND STRL (DRAPES) ×3 IMPLANT
CUFF TOURNIQUET SINGLE 18IN (TOURNIQUET CUFF) IMPLANT
DECANTER SPIKE VIAL GLASS SM (MISCELLANEOUS) IMPLANT
DRAPE EXTREMITY T 121X128X90 (DRAPE) ×3 IMPLANT
DRAPE IMP U-DRAPE 54X76 (DRAPES) ×3 IMPLANT
DRAPE SURG 17X23 STRL (DRAPES) ×3 IMPLANT
GAUZE 4X4 16PLY RFD (DISPOSABLE) IMPLANT
GAUZE SPONGE 4X4 12PLY STRL (GAUZE/BANDAGES/DRESSINGS) ×3 IMPLANT
GAUZE XEROFORM 1X8 LF (GAUZE/BANDAGES/DRESSINGS) ×3 IMPLANT
GLOVE BIO SURGEON STRL SZ7 (GLOVE) ×2 IMPLANT
GLOVE BIOGEL PI IND STRL 7.0 (GLOVE) ×1 IMPLANT
GLOVE BIOGEL PI INDICATOR 7.0 (GLOVE) ×2
GLOVE ECLIPSE 7.0 STRL STRAW (GLOVE) ×3 IMPLANT
GLOVE SKINSENSE NS SZ7.5 (GLOVE) ×2
GLOVE SKINSENSE STRL SZ7.5 (GLOVE) ×1 IMPLANT
GLOVE SURG SYN 7.5  E (GLOVE) ×2
GLOVE SURG SYN 7.5 E (GLOVE) ×1 IMPLANT
GLOVE SURG SYN 7.5 PF PI (GLOVE) ×1 IMPLANT
GOWN STRL REIN XL XLG (GOWN DISPOSABLE) ×3 IMPLANT
GOWN STRL REUS W/ TWL XL LVL3 (GOWN DISPOSABLE) ×2 IMPLANT
GOWN STRL REUS W/TWL XL LVL3 (GOWN DISPOSABLE) ×6
NDL HYPO 25X1 1.5 SAFETY (NEEDLE) IMPLANT
NEEDLE HYPO 25X1 1.5 SAFETY (NEEDLE) IMPLANT
NS IRRIG 1000ML POUR BTL (IV SOLUTION) ×3 IMPLANT
PACK BASIN DAY SURGERY FS (CUSTOM PROCEDURE TRAY) ×3 IMPLANT
PAD CAST 3X4 CTTN HI CHSV (CAST SUPPLIES) ×1 IMPLANT
PADDING CAST COTTON 3X4 STRL (CAST SUPPLIES) ×3
RUBBERBAND STERILE (MISCELLANEOUS) ×6 IMPLANT
STOCKINETTE 4X48 STRL (DRAPES) ×3 IMPLANT
SUT ETHILON 3 0 PS 1 (SUTURE) ×3 IMPLANT
SYR BULB 3OZ (MISCELLANEOUS) ×3 IMPLANT
SYR CONTROL 10ML LL (SYRINGE) IMPLANT
TOWEL GREEN STERILE FF (TOWEL DISPOSABLE) ×3 IMPLANT
TRAY DSU PREP LF (CUSTOM PROCEDURE TRAY) ×3 IMPLANT
TUBE CONNECTING 20'X1/4 (TUBING)
TUBE CONNECTING 20X1/4 (TUBING) IMPLANT
UNDERPAD 30X30 (UNDERPADS AND DIAPERS) ×3 IMPLANT

## 2018-06-28 NOTE — Discharge Instructions (Signed)
Postoperative instructions: ° °Weightbearing instructions: no heavy lifting ° °Dressing instructions: Keep your dressing and/or splint clean and dry at all times.  It will be removed at your first post-operative appointment.  Your stitches and/or staples will be removed at this visit. ° °Incision instructions:  Do not soak your incision for 3 weeks after surgery.  If the incision gets wet, pat dry and do not scrub the incision. ° °Pain control:  You have been given a prescription to be taken as directed for post-operative pain control.  In addition, elevate the operative extremity above the heart at all times to prevent swelling and throbbing pain. ° °Take over-the-counter Colace, 100mg by mouth twice a day while taking narcotic pain medications to help prevent constipation. ° °Follow up appointments: °1) 12-14 days for suture removal and wound check. °2) Dr. Xu as scheduled. ° ° ------------------------------------------------------------------------------------------------------------- ° °After Surgery Pain Control: ° °After your surgery, post-surgical discomfort or pain is likely. This discomfort can last several days to a few weeks. At certain times of the day your discomfort may be more intense.  °Did you receive a nerve block?  °A nerve block can provide pain relief for one hour to two days after your surgery. As long as the nerve block is working, you will experience little or no sensation in the area the surgeon operated on.  °As the nerve block wears off, you will begin to experience pain or discomfort. It is very important that you begin taking your prescribed pain medication before the nerve block fully wears off. Treating your pain at the first sign of the block wearing off will ensure your pain is better controlled and more tolerable when full-sensation returns. Do not wait until the pain is intolerable, as the medicine will be less effective. It is better to treat pain in advance than to try and catch  up.  °General Anesthesia:  °If you did not receive a nerve block during your surgery, you will need to start taking your pain medication shortly after your surgery and should continue to do so as prescribed by your surgeon.  °Pain Medication:  °Most commonly we prescribe Vicodin and Percocet for post-operative pain. Both of these medications contain a combination of acetaminophen (Tylenol®) and a narcotic to help control pain.  °· It takes between 30 and 45 minutes before pain medication starts to work. It is important to take your medication before your pain level gets too intense.  °· Nausea is a common side effect of many pain medications. You will want to eat something before taking your pain medicine to help prevent nausea.  °· If you are taking a prescription pain medication that contains acetaminophen, we recommend that you do not take additional over the counter acetaminophen (Tylenol®).  °Other pain relieving options:  °· Using a cold pack to ice the affected area a few times a day (15 to 20 minutes at a time) can help to relieve pain, reduce swelling and bruising.  °· Elevation of the affected area can also help to reduce pain and swelling. ° ° ° ° °Post Anesthesia Home Care Instructions ° °Activity: °Get plenty of rest for the remainder of the day. A responsible individual must stay with you for 24 hours following the procedure.  °For the next 24 hours, DO NOT: °-Drive a car °-Operate machinery °-Drink alcoholic beverages °-Take any medication unless instructed by your physician °-Make any legal decisions or sign important papers. ° °Meals: °Start with liquid foods such as gelatin   or soup. Progress to regular foods as tolerated. Avoid greasy, spicy, heavy foods. If nausea and/or vomiting occur, drink only clear liquids until the nausea and/or vomiting subsides. Call your physician if vomiting continues. ° °Special Instructions/Symptoms: °Your throat may feel dry or sore from the anesthesia or the  breathing tube placed in your throat during surgery. If this causes discomfort, gargle with warm salt water. The discomfort should disappear within 24 hours. ° °If you had a scopolamine patch placed behind your ear for the management of post- operative nausea and/or vomiting: ° °1. The medication in the patch is effective for 72 hours, after which it should be removed.  Wrap patch in a tissue and discard in the trash. Wash hands thoroughly with soap and water. °2. You may remove the patch earlier than 72 hours if you experience unpleasant side effects which may include dry mouth, dizziness or visual disturbances. °3. Avoid touching the patch. Wash your hands with soap and water after contact with the patch. °  ° °

## 2018-06-28 NOTE — H&P (Signed)
PREOPERATIVE H&P  Chief Complaint: right carpal tunnel syndrome  HPI: Amy Aguilar is a 42 y.o. female who presents for surgical treatment of right carpal tunnel syndrome.  She denies any changes in medical history.  Past Medical History:  Diagnosis Date  . Abnormal uterine bleeding (AUB)   . Anemia    received blood transfusion 2 wks ago  . Blood transfusion without reported diagnosis   . Headache    migraines  . Leiomyoma of uterus   . Uterine fibroid    Past Surgical History:  Procedure Laterality Date  . ABDOMINAL HYSTERECTOMY    . BILATERAL SALPINGECTOMY Bilateral 12/05/2014   Procedure: BILATERAL SALPINGECTOMY;  Surgeon: Margarette Asal, MD;  Location: [facility];  Service: Gynecology;  Laterality: Bilateral;  . LAPAROSCOPIC ASSISTED VAGINAL HYSTERECTOMY N/A 12/05/2014   Procedure: LAPAROSCOPY, ATTEMPTED VAGINAL HYSTERECTOMY, TOTAL ABDOMINAL HYSTERECTOMY;  Surgeon: Margarette Asal, MD;  Location: [facility];  Service: Gynecology;  Laterality: N/A;   Social History   Socioeconomic History  . Marital status: Divorced    Spouse name: Not on file  . Number of children: Not on file  . Years of education: Not on file  . Highest education level: Not on file  Occupational History  . Not on file  Social Needs  . Financial resource strain: Not on file  . Food insecurity:    Worry: Not on file    Inability: Not on file  . Transportation needs:    Medical: Not on file    Non-medical: Not on file  Tobacco Use  . Smoking status: Never Smoker  . Smokeless tobacco: Never Used  Substance and Sexual Activity  . Alcohol use: No  . Drug use: No  . Sexual activity: Yes    Birth control/protection: Surgical    Comment: hysterectomy  Lifestyle  . Physical activity:    Days per week: Not on file    Minutes per session: Not on file  . Stress: Not on file  Relationships  . Social connections:    Talks on phone: Not on file      Gets together: Not on file    Attends religious service: Not on file    Active member of club or organization: Not on file    Attends meetings of clubs or organizations: Not on file    Relationship status: Not on file  Other Topics Concern  . Not on file  Social History Narrative   ** Merged History Encounter **       Marital status: married x 17 years; happily married.  From [place]; moved to [place] 2000. Children: 5 children; no grandchildren.  Living: husband, 5 children. Employment: homemaker.   Family History  Problem Relation Age of Onset  . Hypertension Father    No Known Allergies Prior to Admission medications   Not on File     Positive ROS: All other systems have been reviewed and were otherwise negative with the exception of those mentioned in the HPI and as above.  Physical Exam: General: Alert, no acute distress Cardiovascular: No pedal edema Respiratory: No cyanosis, no use of accessory musculature GI: abdomen soft Skin: No lesions in the area of chief complaint Neurologic: Sensation intact distally Psychiatric: Patient is competent for consent with normal mood and affect Lymphatic: no lymphedema  MUSCULOSKELETAL: exam stable  Assessment: right carpal tunnel syndrome  Plan: Plan for Procedure(s): RIGHT CARPAL TUNNEL RELEASE  The risks benefits and alternatives were discussed with  the patient including but not limited to the risks of nonoperative treatment, versus surgical intervention including infection, bleeding, nerve injury,  blood clots, cardiopulmonary complications, morbidity, mortality, among others, and they were willing to proceed.   Eduard Roux, MD   06/28/2018 8:26 AM

## 2018-06-28 NOTE — Anesthesia Postprocedure Evaluation (Signed)
Anesthesia Post Note  Patient: Nurse, adult  Procedure(s) Performed: RIGHT CARPAL TUNNEL RELEASE (Right Wrist)     Patient location during evaluation: PACU Anesthesia Type: Bier Block Level of consciousness: awake and alert Pain management: pain level controlled Vital Signs Assessment: post-procedure vital signs reviewed and stable Respiratory status: spontaneous breathing Cardiovascular status: stable Anesthetic complications: no    Last Vitals:  Vitals:   06/28/18 1145 06/28/18 1200  BP: 108/79 113/87  Pulse: 63 62  Resp: 17 12  Temp:    SpO2: 98% 98%    Last Pain:  Vitals:   06/28/18 1200  TempSrc:   PainSc: 2                  Nolon Nations

## 2018-06-28 NOTE — Anesthesia Preprocedure Evaluation (Signed)
Anesthesia Evaluation  Patient identified by MRN, date of birth, ID band Patient awake    Reviewed: Allergy & Precautions, NPO status , Patient's Chart, lab work & pertinent test results  Airway Mallampati: III  TM Distance: >3 FB Neck ROM: Full    Dental  (+) Dental Advisory Given, Teeth Intact   Pulmonary neg pulmonary ROS,    Pulmonary exam normal breath sounds clear to auscultation       Cardiovascular hypertension, + Peripheral Vascular Disease  Normal cardiovascular exam Rhythm:Regular Rate:Normal     Neuro/Psych  Headaches,  Neuromuscular disease negative psych ROS   GI/Hepatic negative GI ROS, Neg liver ROS,   Endo/Other  negative endocrine ROS  Renal/GU negative Renal ROS     Musculoskeletal negative musculoskeletal ROS (+)   Abdominal   Peds  Hematology  (+) anemia ,   Anesthesia Other Findings   Reproductive/Obstetrics negative OB ROS                             Anesthesia Physical  Anesthesia Plan  ASA: II  Anesthesia Plan: Bier Block and Bier Block-LIDOCAINE ONLY   Post-op Pain Management:    Induction: Intravenous  PONV Risk Score and Plan: 3 and Ondansetron, Dexamethasone and Midazolam  Airway Management Planned: Simple Face Mask  Additional Equipment:   Intra-op Plan:   Post-operative Plan:   Informed Consent: I have reviewed the patients History and Physical, chart, labs and discussed the procedure including the risks, benefits and alternatives for the proposed anesthesia with the patient or authorized representative who has indicated his/her understanding and acceptance.   Dental advisory given  Plan Discussed with: CRNA and Surgeon  Anesthesia Plan Comments:         Anesthesia Quick Evaluation

## 2018-06-28 NOTE — Op Note (Signed)
   Carpal tunnel op note  DATE OF SURGERY:06/28/2018  PREOPERATIVE DIAGNOSIS:  Right carpal tunnel syndrome  POSTOPERATIVE DIAGNOSIS: same  PROCEDURE:  Right carpal tunnel release. CPT [phone]  SURGEON: Surgeon(s): Leandrew Koyanagi, MD  ASSIST: none  ANESTHESIA:  Regional  TOURNIQUET TIME: less than 10 minutes  BLOOD LOSS: Minimal.  COMPLICATIONS: None.  PATHOLOGY: None.  INDICATIONS: The patient is a 41 y.o. -year-old female who presented with carpal tunnel syndrome failing nonsurgical management, indicated for surgical release.  DESCRIPTION OF PROCEDURE: The patient was identified in the preoperative holding area.  The operative site was marked by the surgeon and confirmed by the patient.  He was brought back to the operating room.  Anesthesia was induced by the anesthesia team.  A well padded nonsterile tourniquet was placed. The operative extremity was prepped and draped in standard sterile fashion.  A timeout was performed.  Preoperative antibiotics were given.   A palmar incision was made about 5 mm ulnar to the thenar crease.  The palmar aponeurosis was exposed and divided in line with the skin incision. The palmaris brevis was visualized and divided.  The distal edge of the transcarpal ligament was identified. A hemostat was inserted into the carpal tunnel to protect the median nerve and the flexor tendons. Then, the transverse carpal ligament was released under direct visualization. Proximally, a subcutaneous tunnel was made allowing a Sewell retractor to be placed. Then, the distal portion of the antebrachial fascia was released. Distally, all fibrous bands were released. The median nerve was visualized, and the fat pad was exposed. Following release, local infiltration with 0.25% of Sensorcaine was given. The tourniquet was deflated. Hemostasis achieved.  Wound was irrigated and closed with 4-0 nylon sutures. Sterile dressing applied. The patient was transferred to the recovery room  in stable condition after all counts were correct.  POSTOPERATIVE PLAN: To start nerve gliding exercises as tolerated and no heavy lifting for four weeks.

## 2018-06-28 NOTE — Transfer of Care (Signed)
Immediate Anesthesia Transfer of Care Note  Patient: Amy Aguilar  Procedure(s) Performed: RIGHT CARPAL TUNNEL RELEASE (Right Wrist)  Patient Location: PACU  Anesthesia Type:MAC and Bier block  Level of Consciousness: awake, alert  and oriented  Airway & Oxygen Therapy: Patient Spontanous Breathing  Post-op Assessment: Report given to RN and Post -op Vital signs reviewed and stable  Post vital signs: Reviewed and stable  Last Vitals:  Vitals Value Taken Time  BP    Temp    Pulse 80 06/28/2018 11:20 AM  Resp 13 06/28/2018 11:20 AM  SpO2 99 % 06/28/2018 11:20 AM  Vitals shown include unvalidated device data.  Last Pain:  Vitals:   06/28/18 0944  TempSrc: Oral  PainSc: 3       Patients Stated Pain Goal: 3 (54/27/06 2376)  Complications: No apparent anesthesia complications

## 2018-06-29 ENCOUNTER — Encounter (HOSPITAL_BASED_OUTPATIENT_CLINIC_OR_DEPARTMENT_OTHER): Payer: Self-pay | Admitting: Orthopaedic Surgery

## 2018-07-10 ENCOUNTER — Telehealth (INDEPENDENT_AMBULATORY_CARE_PROVIDER_SITE_OTHER): Payer: Self-pay | Admitting: *Deleted

## 2018-07-10 NOTE — Telephone Encounter (Signed)
See message.

## 2018-07-10 NOTE — Telephone Encounter (Signed)
Pt called had carpal tunnel surgery on 06/28/18, states she is now starting to swell in fingers, she has little pain, no fever. Wants to know what she can do. Please call [phone]

## 2018-07-10 NOTE — Telephone Encounter (Signed)
Elevate, ice, take nsaids

## 2018-07-11 NOTE — Telephone Encounter (Signed)
IC s/w patient and advised. She verbalized understanding.  

## 2018-07-12 ENCOUNTER — Ambulatory Visit (INDEPENDENT_AMBULATORY_CARE_PROVIDER_SITE_OTHER): Payer: BLUE CROSS/BLUE SHIELD | Admitting: Physician Assistant

## 2018-07-12 ENCOUNTER — Encounter (INDEPENDENT_AMBULATORY_CARE_PROVIDER_SITE_OTHER): Payer: Self-pay | Admitting: Orthopaedic Surgery

## 2018-07-12 DIAGNOSIS — G5601 Carpal tunnel syndrome, right upper limb: Secondary | ICD-10-CM | POA: Diagnosis not present

## 2018-07-12 DIAGNOSIS — Z9889 Other specified postprocedural states: Secondary | ICD-10-CM | POA: Diagnosis not present

## 2018-07-12 NOTE — Progress Notes (Signed)
Post-Op Visit Note   Patient: Amy Aguilar           Date of Birth: 1976/04/21           MRN: [id number] Visit Date: 07/12/2018 PCP: Martinique, Betty G, MD   Assessment & Plan:  Chief Complaint:  Chief Complaint  Patient presents with  . Right Wrist - Pain   Visit Diagnoses:  1. S/P carpal tunnel release     Plan: Patient is a pleasant 42 year old female who presents to our clinic today 14 days status post right carpal tunnel release, date of surgery 06/28/2018.  She has been complaining about mild/moderate amount of pain since surgery.  She notes that she is having burning stabbing pains to the right thumb, index and long fingers associated with decreased sensation.  She also notes that her fingers feel cold and she has swelling at times.  No fevers, chills or any other systemic symptoms.  Examination of her right wrist reveals a well-healing surgical incision with nylon sutures in place.  No evidence of infection or cellulitis.  Mild swelling to the right hand.  Decreased sensation to the thumb, index and long fingers.  She is able to slightly bend her fingers.  Today, nylon sutures were removed.  We will provide the patient with a removable splint to wear for comfort.  We will start her in hand therapy.  A prescription was given to the patient today for this.  She will continue to be out of work for the next 2 weeks.  No heavy lifting for the next 2 weeks.  Follow-up with [place] at that point for likely release to work as long as her incision has healed.  Call with concerns or questions in the meantime.  Follow-Up Instructions: Return in about 2 weeks (around 07/26/2018).   Orders:  No orders of the defined types were placed in this encounter.  No orders of the defined types were placed in this encounter.   Imaging: No new imaging  PMFS History: Patient Active Problem List   Diagnosis Date Noted  . S/P carpal tunnel release 07/12/2018  . Dizziness 02/21/2018  .  Headache 02/21/2018  . Varicose veins of bilateral lower extremities with pain 04/13/2017  . Class 1 obesity without serious comorbidity with body mass index (BMI) of 34.0 to 34.9 in adult 04/13/2017  . Carpal tunnel syndrome, left 09/14/2016  . Carpal tunnel syndrome on right 09/14/2016  . Fibroids 12/05/2014  . Leiomyoma of uterus 11/21/2014  . Anemia 11/21/2014  . Menorrhagia 09/18/2012   Past Medical History:  Diagnosis Date  . Abnormal uterine bleeding (AUB)   . Anemia    received blood transfusion 2 wks ago  . Blood transfusion without reported diagnosis   . Headache    migraines  . Leiomyoma of uterus   . Uterine fibroid     Family History  Problem Relation Age of Onset  . Hypertension Father     Past Surgical History:  Procedure Laterality Date  . ABDOMINAL HYSTERECTOMY    . BILATERAL SALPINGECTOMY Bilateral 12/05/2014   Procedure: BILATERAL SALPINGECTOMY;  Surgeon: Margarette Asal, MD;  Location: [facility];  Service: Gynecology;  Laterality: Bilateral;  . CARPAL TUNNEL RELEASE Right 06/28/2018   Procedure: RIGHT CARPAL TUNNEL RELEASE;  Surgeon: Leandrew Koyanagi, MD;  Location: [place];  Service: Orthopedics;  Laterality: Right;  . LAPAROSCOPIC ASSISTED VAGINAL HYSTERECTOMY N/A 12/05/2014   Procedure: LAPAROSCOPY, ATTEMPTED VAGINAL HYSTERECTOMY, TOTAL ABDOMINAL HYSTERECTOMY;  Surgeon: Margarette Asal, MD;  Location: [facility];  Service: Gynecology;  Laterality: N/A;   Social History   Occupational History  . Not on file  Tobacco Use  . Smoking status: Never Smoker  . Smokeless tobacco: Never Used  Substance and Sexual Activity  . Alcohol use: No  . Drug use: No  . Sexual activity: Yes    Birth control/protection: Surgical    Comment: hysterectomy

## 2018-07-24 ENCOUNTER — Ambulatory Visit: Payer: Self-pay | Admitting: Family Medicine

## 2018-07-26 ENCOUNTER — Other Ambulatory Visit (INDEPENDENT_AMBULATORY_CARE_PROVIDER_SITE_OTHER): Payer: Self-pay | Admitting: Physician Assistant

## 2018-07-26 ENCOUNTER — Encounter (INDEPENDENT_AMBULATORY_CARE_PROVIDER_SITE_OTHER): Payer: Self-pay | Admitting: Orthopaedic Surgery

## 2018-07-26 ENCOUNTER — Ambulatory Visit (INDEPENDENT_AMBULATORY_CARE_PROVIDER_SITE_OTHER): Payer: BLUE CROSS/BLUE SHIELD | Admitting: Physician Assistant

## 2018-07-26 DIAGNOSIS — Z9889 Other specified postprocedural states: Secondary | ICD-10-CM

## 2018-07-26 MED ORDER — AMITRIPTYLINE HCL 25 MG PO TABS: 25.0000 mg | ORAL_TABLET | Freq: Every day | ORAL | 1 refills | Status: DC

## 2018-07-26 MED ORDER — PREDNISONE 10 MG (21) PO TBPK: ORAL_TABLET | ORAL | 0 refills | Status: DC

## 2018-07-26 NOTE — Progress Notes (Signed)
Post-Op Visit Note   Patient: Amy Aguilar           Date of Birth: [birth year]           MRN: [id number] Visit Date: 07/26/2018 PCP: Martinique, Betty G, MD   Assessment & Plan:  Chief Complaint:  Chief Complaint  Patient presents with  . Right Wrist - Routine Post Op   Visit Diagnoses:  1. S/P carpal tunnel release     Plan: Patient is a pleasant 41 year old female who presents to our clinic today 4 weeks status post right carpal tunnel release, date of surgery 06/28/2018.  She has been doing fairly well in regards to pain since surgery.  Her main issue has been decreased sensation to the median nerve distribution.  She has been in hand therapy for about 2 weeks now.  Still complaining of a fair amount of weakness as well.  She is nervous about returning to work at this point where she has to pack and lift 40 pound boxes.  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  Examination of the right hand reveals decreased sensation to the median nerve distribution.  Full sensation to the long and small finger with mildly postive tinel at the elbow.  She is almost able to make a full composite fist.  Decreased grip strength.  At this point, we will have the patient continue with hand therapy.  We will call in a steroid dosepak and start her on elavil. She will follow-up with [place] in 4 weeks for recheck.  Follow-Up Instructions: Return in about 4 weeks (around 08/23/2018).   Orders:  No orders of the defined types were placed in this encounter.  No orders of the defined types were placed in this encounter.   Imaging: No new imaging  PMFS History: Patient Active Problem List   Diagnosis Date Noted  . S/P carpal tunnel release 07/12/2018  . Dizziness 02/21/2018  . Headache 02/21/2018  . Varicose veins of bilateral lower extremities with pain 04/13/2017  . Class 1 obesity without serious comorbidity with body mass index (BMI) of 34.0 to 34.9 in  adult 04/13/2017  . Carpal tunnel syndrome, left 09/14/2016  . Carpal tunnel syndrome on right 09/14/2016  . Fibroids 12/05/2014  . Leiomyoma of uterus 11/21/2014  . Anemia 11/21/2014  . Menorrhagia 09/18/2012   Past Medical History:  Diagnosis Date  . Abnormal uterine bleeding (AUB)   . Anemia    received blood transfusion 2 wks ago  . Blood transfusion without reported diagnosis   . Headache    migraines  . Leiomyoma of uterus   . Uterine fibroid     Family History  Problem Relation Age of Onset  . Hypertension Father     Past Surgical History:  Procedure Laterality Date  . ABDOMINAL HYSTERECTOMY    . BILATERAL SALPINGECTOMY Bilateral 12/05/2014   Procedure: BILATERAL SALPINGECTOMY;  Surgeon: Margarette Asal, MD;  Location: [facility];  Service: Gynecology;  Laterality: Bilateral;  . CARPAL TUNNEL RELEASE Right 06/28/2018   Procedure: RIGHT CARPAL TUNNEL RELEASE;  Surgeon: Leandrew Koyanagi, MD;  Location: [place];  Service: Orthopedics;  Laterality: Right;  . LAPAROSCOPIC ASSISTED VAGINAL HYSTERECTOMY N/A 12/05/2014   Procedure: LAPAROSCOPY, ATTEMPTED VAGINAL HYSTERECTOMY, TOTAL ABDOMINAL HYSTERECTOMY;  Surgeon: Margarette Asal, MD;  Location: [place];  Service: Gynecology;  Laterality: N/A;   Social History   Occupational History  . Not on file  Tobacco  Use  . Smoking status: Never Smoker  . Smokeless tobacco: Never Used  Substance and Sexual Activity  . Alcohol use: No  . Drug use: No  . Sexual activity: Yes    Birth control/protection: Surgical    Comment: hysterectomy

## 2018-07-27 ENCOUNTER — Telehealth (INDEPENDENT_AMBULATORY_CARE_PROVIDER_SITE_OTHER): Payer: Self-pay | Admitting: Physician Assistant

## 2018-07-27 NOTE — Telephone Encounter (Signed)
Patient called stating that there are no instructions on how and when to take the Prednisone.  CB#[phone].  Thank you.

## 2018-07-27 NOTE — Telephone Encounter (Signed)
See message.  Did you gave her the pack?

## 2018-07-28 NOTE — Telephone Encounter (Signed)
Yes, I called in. Take as directed on package.  If no directions on there, 6 pills first day, 5 next day, 4 next day and so on until gone.  Should finish on day 6.

## 2018-07-28 NOTE — Telephone Encounter (Signed)
Called patient to advise  °

## 2018-08-14 ENCOUNTER — Ambulatory Visit: Payer: BLUE CROSS/BLUE SHIELD | Attending: Family Medicine | Admitting: Family Medicine

## 2018-08-14 ENCOUNTER — Other Ambulatory Visit: Payer: Self-pay

## 2018-08-14 ENCOUNTER — Other Ambulatory Visit: Payer: Self-pay | Admitting: Family Medicine

## 2018-08-14 ENCOUNTER — Encounter: Payer: Self-pay | Admitting: Family Medicine

## 2018-08-14 VITALS — BP 115/87 | HR 73 | Temp 98.1°F | Resp 18 | Ht 62.0 in | Wt 187.8 lb

## 2018-08-14 DIAGNOSIS — Z9889 Other specified postprocedural states: Secondary | ICD-10-CM | POA: Insufficient documentation

## 2018-08-14 DIAGNOSIS — G43109 Migraine with aura, not intractable, without status migrainosus: Secondary | ICD-10-CM

## 2018-08-14 DIAGNOSIS — N644 Mastodynia: Secondary | ICD-10-CM

## 2018-08-14 DIAGNOSIS — Z90710 Acquired absence of both cervix and uterus: Secondary | ICD-10-CM | POA: Insufficient documentation

## 2018-08-14 DIAGNOSIS — Z8249 Family history of ischemic heart disease and other diseases of the circulatory system: Secondary | ICD-10-CM | POA: Insufficient documentation

## 2018-08-14 MED ORDER — ONDANSETRON HCL 4 MG PO TABS: 4.0000 mg | ORAL_TABLET | Freq: Three times a day (TID) | ORAL | 2 refills | Status: DC | PRN

## 2018-08-14 MED ORDER — RIZATRIPTAN BENZOATE 10 MG PO TABS: 10.0000 mg | ORAL_TABLET | ORAL | 3 refills | Status: DC | PRN

## 2018-08-14 MED FILL — RIZATRIPTAN BENZOATE 10 MG: 10 | 30 days supply | Qty: 9 | Fill #0

## 2018-08-14 MED FILL — ONDANSETRON HCL 4 MG TABLET: 4 | 6 days supply | Qty: 20 | Fill #0

## 2018-08-14 NOTE — Progress Notes (Signed)
Subjective:    Patient ID: Amy Aguilar, female    DOB: Apr 24, 1976, 42 y.o.   MRN: [id number]  HPI 42 year old female new to the practice.  Patient with complaint of recurrent headaches.  Patient states that the headaches start behind her right eye and spread backwards over the right scalp.  Patient states that she is aware of when she is going to have a migraine because she will have onset of yawning and will see spots in her visual field.  Patient states that as the headache progresses, and becomes a 10 on a 0-to-10 scale.  Headache becomes sharp and pounding.  Patient states that her headaches are accompanied by nausea when the headaches get really worse.  Patient does have light and noise sensitivity with her headaches.  Patient states that sometimes she is able to decrease her headaches with the use of Motrin but this does not completely resolve the headache.  If patient takes over-the-counter Excedrin Migraine she also has relief of headaches however this medication tends to give her an upset stomach.  Patient states that she has never been on medication specifically for migraines and that she has never been on medications to prevent migraines.  On review of her chart, patient did have hospitalization on 02/21/2018 secondary to dizziness, headache and episode of slurred speech which was thought to be headache related as patient had normal CT scan and MRI of the brain.  Patient states that currently she has had 2-3 migraines so far for the month of October.      Patient also states that when she initially had her appointment scheduled year, she had a palpable, tender mass in her left breast.  Patient states that this mass is now gone away.  Patient does not feel as if there is any tenderness.  Patient states that she has had one prior mammogram.  Patient reports past hysterectomy secondary to fibroids therefore patient does not know if the breast tenderness is associated with menstrual  cycles.  Patient has noticed no skin changes and no nipple discharge.  Patient has not felt for any axillary adenopathy.  Patient denies any tenderness in the right breast.      Patient reports that she does not smoke.  Patient is status post recent surgery for carpal tunnel syndrome in her right wrist/hand and patient is currently out of work after her surgery.  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  Patient reports no known drug allergies.  Patient reports family history significant for father with hypertension.  Family history is negative for diabetes, heart disease, stroke or cancer. Past Medical History:  Diagnosis Date  . Abnormal uterine bleeding (AUB)   . Anemia    received blood transfusion 2 wks ago  . Blood transfusion without reported diagnosis   . Headache    migraines  . Leiomyoma of uterus   . Uterine fibroid    Past Surgical History:  Procedure Laterality Date  . ABDOMINAL HYSTERECTOMY    . BILATERAL SALPINGECTOMY Bilateral 12/05/2014   Procedure: BILATERAL SALPINGECTOMY;  Surgeon: Margarette Asal, MD;  Location: [facility];  Service: Gynecology;  Laterality: Bilateral;  . CARPAL TUNNEL RELEASE Right 06/28/2018   Procedure: RIGHT CARPAL TUNNEL RELEASE;  Surgeon: Leandrew Koyanagi, MD;  Location: [place];  Service: Orthopedics;  Laterality: Right;  . LAPAROSCOPIC ASSISTED VAGINAL HYSTERECTOMY N/A 12/05/2014   Procedure: LAPAROSCOPY, ATTEMPTED VAGINAL  HYSTERECTOMY, TOTAL ABDOMINAL HYSTERECTOMY;  Surgeon: Margarette Asal, MD;  Location: [facility];  Service: Gynecology;  Laterality: N/A;   Family History  Problem Relation Age of Onset  . Hypertension Father    Social History   Tobacco Use  . Smoking status: Never Smoker  . Smokeless tobacco: Never Used  Substance Use Topics  . Alcohol use: No  . Drug use: No  No Known Allergies    Review of Systems      Objective:   Physical Exam BP 115/87   Pulse 73   Temp 98.1 F (36.7 C) (Oral)   Resp 18   Ht 5\' 2"  (1.575 m)   Wt 187 lb 12.8 oz (85.2 kg)   LMP 11/21/2014   SpO2 99%   BMI 34.35 kg/m Vital signs and nurse's notes reviewed General-well-nourished, well-developed adult female in no acute distress EENT- normal conjunctiva, extraocular movements intact TMs gray, mild edema of the nasal turbinates, normal oropharynx Neck-supple, no lymphadenopathy, no thyromegaly, no carotid bruit Lungs-clear to auscultation bilaterally Cardiovascular-regular rate and rhythm Abdomen-soft, nontender Back-no CVA tenderness Left breast exam- patient with some mild left axillary fullness but no palpable adenopathy.  Patient has a bruise that is light greenish-yellow on the left medial breast at approximately 10 o'clock position, patient does not recall how this occurred.  Patient has a small area of increased tissue density beneath the left medial areola and patient with a tender tissue density in the left upper outer breast at approximately 3-4 o'clock Neuro-cranial nerves II through XII are grossly intact        Assessment & Plan:  1. Migraine with aura and without status migrainosus, not intractable Patient's notes from her hospitalization on 02/21/2018 were reviewed.  Patient appears to have migraine type headaches.  Patient did not see neurology status post hospital follow-up as patient thought that she did not need to since her testing during her hospitalization was normal.  Patient given prescription for Maxalt 10 mg to take at onset of migraine type headache and may repeat in 2 hours if persistent headache symptoms.  Prescription also provided for Zofran to take as needed for nausea associated with migraine headaches.  Patient states that she does currently have some nausea medicine that was prescribed status post recent surgery for carpal tunnel syndrome.  I discussed with the patient that I would like  to see her back in about 4 to 6 weeks to see if the medication helped with her migraines.  If patient is having more frequent migraines, discussed possibility of a preventive type of medication such as Topamax. - rizatriptan (MAXALT) 10 MG tablet; Take 1 tablet (10 mg total) by mouth as needed for migraine. May repeat in 2 hours if needed; max of 4 pills per 24 hours  Dispense: 10 tablet; Refill: 3 - ondansetron (ZOFRAN) 4 MG tablet; Take 1 tablet (4 mg total) by mouth every 8 (eight) hours as needed for nausea or vomiting. Associated with migraine  Dispense: 20 tablet; Refill: 2  2. Breast pain, left Patient with complaint of prior tender nodule on the left breast which is now resolved but on examination, patient still with tissue density and did have some tenderness to palpation.  Patient will be scheduled for an ultrasound of the left breast.  Patient reports that she has had mammogram x1 in the past. - US BREAST LTD UNI LEFT INC AXILLA; Future  *Patient was offered but declined influenza immunization at today's visit  An After  Visit Summary was printed and given to the patient.  Return in about 6 weeks (around 09/25/2018).

## 2018-08-14 NOTE — Patient Instructions (Signed)
Migraine Headache A migraine headache is a very strong throbbing pain on one side or both sides of your head. Migraines can also cause other symptoms. Talk with your doctor about what things may bring on (trigger) your migraine headaches. Follow these instructions at home: Medicines  Take over-the-counter and prescription medicines only as told by your doctor.  Do not drive or use heavy machinery while taking prescription pain medicine.  To prevent or treat constipation while you are taking prescription pain medicine, your doctor may recommend that you: ? Drink enough fluid to keep your pee (urine) clear or pale yellow. ? Take over-the-counter or prescription medicines. ? Eat foods that are high in fiber. These include fresh fruits and vegetables, whole grains, and beans. ? Limit foods that are high in fat and processed sugars. These include fried and sweet foods. Lifestyle  Avoid alcohol.  Do not use any products that contain nicotine or tobacco, such as cigarettes and e-cigarettes. If you need help quitting, ask your doctor.  Get at least 8 hours of sleep every night.  Limit your stress. General instructions   Keep a journal to find out what may bring on your migraines. For example, write down: ? What you eat and drink. ? How much sleep you get. ? Any change in what you eat or drink. ? Any change in your medicines.  If you have a migraine: ? Avoid things that make your symptoms worse, such as bright lights. ? It may help to lie down in a dark, quiet room. ? Do not drive or use heavy machinery. ? Ask your doctor what activities are safe for you.  Keep all follow-up visits as told by your doctor. This is important. Contact a doctor if:  You get a migraine that is different or worse than your usual migraines. Get help right away if:  Your migraine gets very bad.  You have a fever.  You have a stiff neck.  You have trouble seeing.  Your muscles feel weak or like you  cannot control them.  You start to lose your balance a lot.  You start to have trouble walking.  You pass out (faint). This information is not intended to replace advice given to you by your health care provider. Make sure you discuss any questions you have with your health care provider. Document Released: 07/20/2008 Document Revised: 04/30/2016 Document Reviewed: 03/29/2016 Elsevier Interactive Patient Education  2018 [place] migraosa Migraine Headache Una cefalea migraosa es un dolor muy intenso y punzante en uno o ambos lados de la cabeza. Las migraas tambin pueden causar otros sntomas. Hable con su mdico [employer] factores que pueden causar Medical illustrator) las Psychologist, occupational. Siga estas indicaciones en su casa: Medicamentos  [employer] de venta libre y los recetados solamente como se lo haya indicado el mdico.  No conduzca ni use maquinaria pesada mientras toma analgsicos recetados.  A fin de prevenir o tratar el estreimiento mientras toma analgsicos recetados, el mdico puede recomendarle lo siguiente: ? Beber suficiente lquido para mantener el pis (orina) claro o de color amarillo plido. ? Tomar medicamentos recetados o de [employer]. ? Comer alimentos ricos en fibra. Entre ellos, frutas y verduras frescas, cereales integrales y frijoles. ? Limitar los alimentos con alto contenido de grasas y azcares procesados. Estos incluyen alimentos fritos y dulces. Estilo de vida  Evite el alcohol.  No consuma ningn producto que contenga nicotina o tabaco, como cigarrillos y Psychologist, sport and exercise. Si necesita ayuda para  dejar de fumar, consulte al mdico.  Duerma como mnimo 8horas todas las noches.  Evite las situaciones de estrs. Instrucciones generales   Lleve un registro diario para Neurosurgeon lo que Hotel manager. Por ejemplo, escriba: ? Lo que usted come y bebe. ? Cunto tiempo duerme. ? Algn cambio en lo que  come o bebe. ? Algn cambio en sus medicamentos.  Si tiene una migraa: ? Evite los factores que [employer] sntomas, como las luces brillantes. ? Resulta til acostarse en una habitacin oscura y silenciosa. ? No conduzca vehculos ni opere maquinaria pesada. ? Pregntele al mdico qu actividades son seguras para usted.  Concurra a todas las visitas de control como se lo haya indicado el mdico. Esto es importante. Comunquese con un mdico si:  Tiene una migraa que es diferente o peor de sus migraas habituales. Solicite ayuda de inmediato si:  La migraa [employer].  Tiene fiebre.  Presenta rigidez en el cuello.  Tiene dificultad para ver.  Siente debilidad en los msculos o que no puede controlarlos.  Comienza a perder el equilibrio continuamente.  Comienza a tener dificultad para caminar.  Pierde el conocimiento (se desmaya). Esta informacin no tiene Marine scientist el consejo del mdico. Asegrese de hacerle al mdico cualquier pregunta que tenga. Document Released: 01/07/2009 Document Revised: 01/18/2017 Document Reviewed: 03/29/2016 Elsevier Interactive Patient Education  2018 [employer].

## 2018-08-14 NOTE — Progress Notes (Signed)
Flu: postpone

## 2018-08-18 ENCOUNTER — Ambulatory Visit
Admission: RE | Admit: 2018-08-18 | Discharge: 2018-08-18 | Disposition: A | Discharge status: Home / Self Care | Payer: BLUE CROSS/BLUE SHIELD | Source: Ambulatory Visit | Attending: Family Medicine | Admitting: Family Medicine

## 2018-08-18 ENCOUNTER — Telehealth: Payer: Self-pay

## 2018-08-18 ENCOUNTER — Other Ambulatory Visit: Payer: Self-pay | Admitting: Family Medicine

## 2018-08-18 DIAGNOSIS — N644 Mastodynia: Secondary | ICD-10-CM

## 2018-08-18 DIAGNOSIS — N6310 Unspecified lump in the right breast, unspecified quadrant: Secondary | ICD-10-CM

## 2018-08-18 NOTE — Telephone Encounter (Signed)
Patient was called, verified dob and was given most recent mammogram results. Patient verbalized understanding and had no further questions.

## 2018-08-18 NOTE — Telephone Encounter (Signed)
-----   Message from Antony Blackbird, MD sent at 08/18/2018  2:36 PM EDT ----- Ultrasound of the breast showed fibrocystic, benign changes

## 2018-08-23 ENCOUNTER — Ambulatory Visit (INDEPENDENT_AMBULATORY_CARE_PROVIDER_SITE_OTHER): Payer: BLUE CROSS/BLUE SHIELD | Admitting: Physician Assistant

## 2018-08-23 ENCOUNTER — Encounter (INDEPENDENT_AMBULATORY_CARE_PROVIDER_SITE_OTHER): Payer: Self-pay

## 2018-08-25 ENCOUNTER — Ambulatory Visit (INDEPENDENT_AMBULATORY_CARE_PROVIDER_SITE_OTHER): Payer: BLUE CROSS/BLUE SHIELD | Admitting: Physician Assistant

## 2018-08-25 ENCOUNTER — Encounter (INDEPENDENT_AMBULATORY_CARE_PROVIDER_SITE_OTHER): Payer: Self-pay | Admitting: Physician Assistant

## 2018-08-25 ENCOUNTER — Telehealth (INDEPENDENT_AMBULATORY_CARE_PROVIDER_SITE_OTHER): Payer: Self-pay | Admitting: Physician Assistant

## 2018-08-25 DIAGNOSIS — G5601 Carpal tunnel syndrome, right upper limb: Secondary | ICD-10-CM

## 2018-08-25 NOTE — Telephone Encounter (Signed)
We cannot find report anywhere in system either, although dr. Erlinda Hong mentions that it is moderate compression of median nerve.  We will refer the patient to dr. Ernestina Patches for repeat ncs, but hopefully we can find this report for him to look at prior to study

## 2018-08-25 NOTE — Telephone Encounter (Signed)
See message below. Please advise what you would like to do now.

## 2018-08-25 NOTE — Progress Notes (Signed)
Post-Op Visit Note   Patient: Amy Aguilar           Date of Birth: [birth year]           MRN: [id number] Visit Date: 08/25/2018 PCP: Antony Blackbird, MD   Assessment & Plan:  Chief Complaint:  Chief Complaint  Patient presents with  . Right Wrist - Pain   Visit Diagnoses:  1. Carpal tunnel syndrome, right upper limb     Plan: Patient is a pleasant 42 year old female who presents to our clinic today 58 days status post right carpal tunnel release, date of surgery 06/28/2018.  She did have a nerve conduction study approximately 2 years ago which according to Dr. Herbie Baltimore note showed moderate compression of the median nerve.  We have been following her for this as she has had no sensation to the median nerve distribution since surgery.  Full motor function.  We have started her on Elavil and she is completed a course of prednisone.  She has also been in hand therapy.  She does state that her pain has dramatically improved, but the sensation has not returned.  Unable to lift more than 5 pounds and unable to return to work due to this.  Examination of her right hand reveals minimal to no swelling.  She is able to make a full composite fist.  She has no sensation to the thumb, index or half of the long finger along the median nerve distribution.  At this point, I believe it is appropriate to repeat her nerve conduction study.  We will try and find out where she originally had this done and refer her back to that location.  If we are unable to figure this out as the patient is not sure where this was done, we will send her to Dr. Ernestina Patches to have this repeated.  She will follow-up with [place] after that.  In the meantime, a new work note was provided for her to remain out of work for 3 weeks.  Follow-Up Instructions: Return in about 2 weeks (around 09/08/2018) for review nerve conduction study.   Orders:  No orders of the defined types were placed in this encounter.  No orders of the defined  types were placed in this encounter.   Imaging: No new imaging  PMFS History: Patient Active Problem List   Diagnosis Date Noted  . S/P carpal tunnel release 07/12/2018  . Dizziness 02/21/2018  . Headache 02/21/2018  . Varicose veins of bilateral lower extremities with pain 04/13/2017  . Class 1 obesity without serious comorbidity with body mass index (BMI) of 34.0 to 34.9 in adult 04/13/2017  . Carpal tunnel syndrome, left 09/14/2016  . Carpal tunnel syndrome, right upper limb 09/14/2016  . Fibroids 12/05/2014  . Leiomyoma of uterus 11/21/2014  . Anemia 11/21/2014  . Menorrhagia 09/18/2012   Past Medical History:  Diagnosis Date  . Abnormal uterine bleeding (AUB)   . Anemia    received blood transfusion 2 wks ago  . Blood transfusion without reported diagnosis   . Headache    migraines  . Leiomyoma of uterus   . Uterine fibroid     Family History  Problem Relation Age of Onset  . Hypertension Father     Past Surgical History:  Procedure Laterality Date  . ABDOMINAL HYSTERECTOMY    . BILATERAL SALPINGECTOMY Bilateral 12/05/2014   Procedure: BILATERAL SALPINGECTOMY;  Surgeon: Margarette Asal, MD;  Location: [facility];  Service: Gynecology;  Laterality: Bilateral;  .  CARPAL TUNNEL RELEASE Right 06/28/2018   Procedure: RIGHT CARPAL TUNNEL RELEASE;  Surgeon: Leandrew Koyanagi, MD;  Location: [place];  Service: Orthopedics;  Laterality: Right;  . LAPAROSCOPIC ASSISTED VAGINAL HYSTERECTOMY N/A 12/05/2014   Procedure: LAPAROSCOPY, ATTEMPTED VAGINAL HYSTERECTOMY, TOTAL ABDOMINAL HYSTERECTOMY;  Surgeon: Margarette Asal, MD;  Location: [place];  Service: Gynecology;  Laterality: N/A;   Social History   Occupational History  . Not on file  Tobacco Use  . Smoking status: Never Smoker  . Smokeless tobacco: Never Used  Substance and Sexual Activity  . Alcohol use: No  . Drug use: No  . Sexual activity: Yes    Birth  control/protection: Surgical    Comment: hysterectomy

## 2018-08-25 NOTE — Telephone Encounter (Signed)
I believe patient said that the office who originally has done her NCS was no longer there so she would need to be referred to Dr. Ernestina Patches. She is asking for a call back so she can tell you since you speak spanish. Could you give patient a call when you have the chance? Patients # [phone]

## 2018-08-28 NOTE — Telephone Encounter (Signed)
IC LM to [facility] to discuss.

## 2018-08-29 ENCOUNTER — Telehealth (INDEPENDENT_AMBULATORY_CARE_PROVIDER_SITE_OTHER): Payer: Self-pay | Admitting: Orthopaedic Surgery

## 2018-08-31 NOTE — Telephone Encounter (Signed)
Please advise 

## 2018-09-01 ENCOUNTER — Other Ambulatory Visit (INDEPENDENT_AMBULATORY_CARE_PROVIDER_SITE_OTHER): Payer: Self-pay | Admitting: Radiology

## 2018-09-01 DIAGNOSIS — G5601 Carpal tunnel syndrome, right upper limb: Secondary | ICD-10-CM

## 2018-09-01 NOTE — Telephone Encounter (Signed)
Yes, new nerve study RUE.  Thank you

## 2018-09-01 NOTE — Telephone Encounter (Signed)
Order placed

## 2018-09-12 ENCOUNTER — Ambulatory Visit (INDEPENDENT_AMBULATORY_CARE_PROVIDER_SITE_OTHER): Payer: BLUE CROSS/BLUE SHIELD | Admitting: Orthopaedic Surgery

## 2018-09-12 ENCOUNTER — Encounter (INDEPENDENT_AMBULATORY_CARE_PROVIDER_SITE_OTHER): Payer: Self-pay | Admitting: Orthopaedic Surgery

## 2018-09-12 DIAGNOSIS — G5601 Carpal tunnel syndrome, right upper limb: Secondary | ICD-10-CM

## 2018-09-12 NOTE — Progress Notes (Signed)
   Post-Op Visit Note   Patient: Amy Aguilar           Date of Birth: October 02, 1976           MRN: [id number] Visit Date: 09/12/2018 PCP: Antony Blackbird, MD   Assessment & Plan:  Chief Complaint:  Chief Complaint  Patient presents with  . Right Hand - Pain   Visit Diagnoses:  1. Carpal tunnel syndrome, right     Plan: Patient is 76 days status post right carpal tunnel release.  She comes in today for recent burn to her right index fingertip.  She states that she continues to be completely numb in her thumb index and the radial side of her long finger.  Her nerve conduction studies are scheduled with Dr. Ernestina Patches for next week.  On physical exam her scar is fully healed.  She has no focal deficits.  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  We will see her back after the nerve conduction studies.  Hopefully this will shed light on what is going on.  Follow-Up Instructions: Return if symptoms worsen or fail to improve.   Orders:  No orders of the defined types were placed in this encounter.  No orders of the defined types were placed in this encounter.   Imaging: No results found.  PMFS History: Patient Active Problem List   Diagnosis Date Noted  . S/P carpal tunnel release 07/12/2018  . Dizziness 02/21/2018  . Headache 02/21/2018  . Varicose veins of bilateral lower extremities with pain 04/13/2017  . Class 1 obesity without serious comorbidity with body mass index (BMI) of 34.0 to 34.9 in adult 04/13/2017  . Carpal tunnel syndrome, left 09/14/2016  . Carpal tunnel syndrome, right upper limb 09/14/2016  . Fibroids 12/05/2014  . Leiomyoma of uterus 11/21/2014  . Anemia 11/21/2014  . Menorrhagia 09/18/2012   Past Medical History:  Diagnosis Date  . Abnormal uterine bleeding (AUB)   . Anemia    received blood transfusion 2 wks ago  . Blood transfusion without reported diagnosis   . Headache    migraines  . Leiomyoma of uterus   . Uterine  fibroid     Family History  Problem Relation Age of Onset  . Hypertension Father     Past Surgical History:  Procedure Laterality Date  . ABDOMINAL HYSTERECTOMY    . BILATERAL SALPINGECTOMY Bilateral 12/05/2014   Procedure: BILATERAL SALPINGECTOMY;  Surgeon: Margarette Asal, MD;  Location: [facility];  Service: Gynecology;  Laterality: Bilateral;  . CARPAL TUNNEL RELEASE Right 06/28/2018   Procedure: RIGHT CARPAL TUNNEL RELEASE;  Surgeon: Leandrew Koyanagi, MD;  Location: [place];  Service: Orthopedics;  Laterality: Right;  . LAPAROSCOPIC ASSISTED VAGINAL HYSTERECTOMY N/A 12/05/2014   Procedure: LAPAROSCOPY, ATTEMPTED VAGINAL HYSTERECTOMY, TOTAL ABDOMINAL HYSTERECTOMY;  Surgeon: Margarette Asal, MD;  Location: [place];  Service: Gynecology;  Laterality: N/A;   Social History   Occupational History  . Not on file  Tobacco Use  . Smoking status: Never Smoker  . Smokeless tobacco: Never Used  Substance and Sexual Activity  . Alcohol use: No  . Drug use: No  . Sexual activity: Yes    Birth control/protection: Surgical    Comment: hysterectomy

## 2018-09-20 ENCOUNTER — Encounter (INDEPENDENT_AMBULATORY_CARE_PROVIDER_SITE_OTHER): Payer: Self-pay | Admitting: Physical Medicine and Rehabilitation

## 2018-09-20 ENCOUNTER — Ambulatory Visit (INDEPENDENT_AMBULATORY_CARE_PROVIDER_SITE_OTHER): Payer: BLUE CROSS/BLUE SHIELD | Admitting: Physical Medicine and Rehabilitation

## 2018-09-20 DIAGNOSIS — R202 Paresthesia of skin: Secondary | ICD-10-CM

## 2018-09-20 NOTE — Progress Notes (Signed)
.  Numeric Pain Rating Scale and Functional Assessment Average Pain 7   In the last MONTH (on 0-10 scale) has pain interfered with the following?  1. General activity like being  able to carry out your everyday physical activities such as walking, climbing stairs, carrying groceries, or moving a chair?  Rating(4)       

## 2018-09-25 ENCOUNTER — Encounter: Payer: Self-pay | Admitting: Family Medicine

## 2018-09-25 ENCOUNTER — Ambulatory Visit: Payer: BLUE CROSS/BLUE SHIELD | Attending: Family Medicine | Admitting: Family Medicine

## 2018-09-25 VITALS — BP 138/85 | HR 80 | Temp 98.2°F | Resp 16 | Wt 194.0 lb

## 2018-09-25 DIAGNOSIS — Z79899 Other long term (current) drug therapy: Secondary | ICD-10-CM | POA: Insufficient documentation

## 2018-09-25 DIAGNOSIS — I83813 Varicose veins of bilateral lower extremities with pain: Secondary | ICD-10-CM | POA: Diagnosis not present

## 2018-09-25 DIAGNOSIS — Z9889 Other specified postprocedural states: Secondary | ICD-10-CM | POA: Insufficient documentation

## 2018-09-25 DIAGNOSIS — G43109 Migraine with aura, not intractable, without status migrainosus: Secondary | ICD-10-CM | POA: Diagnosis not present

## 2018-09-25 DIAGNOSIS — I8393 Asymptomatic varicose veins of bilateral lower extremities: Secondary | ICD-10-CM | POA: Insufficient documentation

## 2018-09-25 MED ORDER — RIZATRIPTAN BENZOATE 10 MG PO TABS: 10.0000 mg | ORAL_TABLET | ORAL | 3 refills | Status: DC | PRN

## 2018-09-25 NOTE — Progress Notes (Signed)
Subjective:    Patient ID: Amy Aguilar, female    DOB: 1975-12-28, 42 y.o.   MRN: [id number]  HPI       42 year old female last seen on 08/14/2018.  Patient had a complaint of recurrent headaches which appear to be migrainous in nature and prescription was provided for Maxalt at her most recent visit as well as Zofran to take as needed for nausea.  Patient also had complaint of left breast pain and diagnostic mammogram and ultrasound were ordered.  Patient had diagnostic mammogram and [place] which were negative.       At today's visit, patient reports that she feels much better.  Patient reports that the Maxalt helps stop her migraine headaches.  Patient has only had to take the Zofran once for nausea associated with migraines.  Patient states that she has about 1 migraine headache per week or less now that she has been taking the Maxalt.  Patient would like to have a refill of the medication at today's visit.  Patient denies any chest pain or stomach upset with the use of Maxalt.  Patient does have new complaint at today's visit of varicose veins on her lower legs which are painful at times.  Patient states that she actually has not been on her feet as much over the past 3 months this patient had right hand carpal tunnel surgery and has been out of work for the past 3 months.  Patient however does feel as if she is gained weight and she believes that this may contribute to more swelling in her legs at times.  Patient recently noticed an area of bruising to the varicose veins on her right leg which occurred after she had had increased pain for a few days.  Patient would like to see a vein specialist.  Patient reports that she has had recent nerve conduction study in her right hand regarding her carpal tunnel syndrome as patient states that she still has some numbness in some of her fingers and patient burned 1 of her fingers without being aware of it because she had no sensation of pain.  Patient  continues to follow-up with orthopedic doctor who did her carpal tunnel surgery.  Patient also has pain and numbness in the left hand and states that at some point she will likely have to have this hand operated on as well.  Past Medical History:  Diagnosis Date  . Abnormal uterine bleeding (AUB)   . Anemia    received blood transfusion 2 wks ago  . Blood transfusion without reported diagnosis   . Headache    migraines  . Leiomyoma of uterus   . Uterine fibroid    Past Surgical History:  Procedure Laterality Date  . ABDOMINAL HYSTERECTOMY    . BILATERAL SALPINGECTOMY Bilateral 12/05/2014   Procedure: BILATERAL SALPINGECTOMY;  Surgeon: Margarette Asal, MD;  Location: [facility];  Service: Gynecology;  Laterality: Bilateral;  . CARPAL TUNNEL RELEASE Right 06/28/2018   Procedure: RIGHT CARPAL TUNNEL RELEASE;  Surgeon: Leandrew Koyanagi, MD;  Location: [place];  Service: Orthopedics;  Laterality: Right;  . LAPAROSCOPIC ASSISTED VAGINAL HYSTERECTOMY N/A 12/05/2014   Procedure: LAPAROSCOPY, ATTEMPTED VAGINAL HYSTERECTOMY, TOTAL ABDOMINAL HYSTERECTOMY;  Surgeon: Margarette Asal, MD;  Location: [place];  Service: Gynecology;  Laterality: N/A;   Family History  Problem Relation Age of Onset  . Hypertension Father    Social History   Tobacco Use  . Smoking status: Never  Smoker  . Smokeless tobacco: Never Used  Substance Use Topics  . Alcohol use: No  . Drug use: No  No Known Allergies    Current Outpatient Medications:  .  amitriptyline (ELAVIL) 25 MG tablet, Take 1 tablet (25 mg total) by mouth at bedtime. (Patient not taking: Reported on 09/20/2018), Disp: 30 tablet, Rfl: 1 .  HYDROcodone-acetaminophen (NORCO) 5-325 MG tablet, Take 1-2 tablets by mouth every 6 (six) hours as needed. (Patient not taking: Reported on 08/14/2018), Disp: 30 tablet, Rfl: 0 .  ondansetron (ZOFRAN) 4 MG tablet, Take 1 tablet (4 mg total) by mouth every 8  (eight) hours as needed for nausea or vomiting. Associated with migraine (Patient not taking: Reported on 09/20/2018), Disp: 20 tablet, Rfl: 2 .  predniSONE (STERAPRED UNI-PAK 21 TAB) 10 MG (21) TBPK tablet, Take as directed (Patient not taking: Reported on 08/14/2018), Disp: 21 tablet, Rfl: 0 .  promethazine (PHENERGAN) 25 MG tablet, Take 1 tablet (25 mg total) by mouth every 6 (six) hours as needed for nausea. (Patient not taking: Reported on 08/14/2018), Disp: 30 tablet, Rfl: 1 .  rizatriptan (MAXALT) 10 MG tablet, Take 1 tablet (10 mg total) by mouth as needed for migraine. May repeat in 2 hours if needed; max of 4 pills per 24 hours, Disp: 10 tablet, Rfl: 3    Review of Systems  Constitutional: Negative for chills, fatigue and fever.  Eyes: Negative for photophobia and visual disturbance.  Respiratory: Negative for cough and shortness of breath.   Cardiovascular: Positive for leg swelling. Negative for chest pain and palpitations.  Endocrine: Negative for polydipsia, polyphagia and polyuria.  Genitourinary: Negative for dysuria, flank pain and frequency.  Musculoskeletal: Positive for arthralgias and myalgias. Negative for back pain, gait problem and joint swelling.  Neurological: Positive for numbness (right hand) and headaches (less frequent). Negative for dizziness.       Objective:   Physical Exam BP 138/85   Pulse 80   Temp 98.2 F (36.8 C) (Oral)   Resp 16   Wt 194 lb (88 kg)   LMP 11/21/2014   SpO2 98%   BMI 35.48 kg/m  Nurse's notes and vital signs reviewed General-well-nourished, well-developed overweight for height female in no acute distress Neuro-cranial nerves II through XII grossly intact ENT- normal exam Neck-supple, no lymphadenopathy Lungs-clear to auscultation bilaterally Cardiovascular-regular rate and rhythm Abdomen-soft, nontender Extremities- patient with some slightly distended varicose veins on the right lower extremity and patient does have a area of  bruising on the right medial calf with tenderness-bruising is a yellow-green color        Assessment & Plan:  1. Migraine with aura and without status migrainosus, not intractable Patient reports that her migraines have been less frequent and that she only has to take 1 Maxalt in order to stop her headaches.  Patient is provided with a refill at today's visit. - rizatriptan (MAXALT) 10 MG tablet; Take 1 tablet (10 mg total) by mouth as needed for migraine. May repeat in 2 hours if needed; max of 4 pills per 24 hours  Dispense: 10 tablet; Refill: 3  2. Varicose veins of bilateral lower extremities with pain Patient with complaint of painful varicose veins of the lower extremities.  Patient was given handout on varicose veins as part of AVS.  Patient was asked to obtain compressive support hose for daily use to help with swelling and pain.  Patient is being referred to vascular surgery for further evaluation and treatment  An After  Visit Summary was printed and given to the patient.  Return in about 4 months (around 01/25/2019) for migraines. - Ambulatory referral to Vascular Surgery

## 2018-09-25 NOTE — Patient Instructions (Signed)
  lcera varicosa (Venous Ulcer) Amy Aguilar lcera varicosa es una llaga superficial en la parte inferior de la pierna. La lcera varicosa es causada por la mala circulacin venosa. Las lceras varicosas son el tipo ms frecuente de lceras de la parte inferior de la pierna. Estas pueden aparecer en una o en ambas piernas. Esta afeccin se manifiesta ms comnmente alrededor [employer]. Este tipo de lcera puede durar mucho tiempo (lcera crnica) o reaparecer con frecuencia (lcera recurrente). CUIDADOS EN EL HOGAR Cuidados de la herida  Siga las indicaciones del mdico respecto de lo siguiente: ? Cmo cuidar de la herida. ? Cmo y cundo cambiar las vendas (vendaje). ? Cundo retirar el vendaje. Si el vendaje est seco y adherido a la pierna cuando intenta quitarlo, humedzcalo o mjelo con solucin salina o con agua. Esto permite retirarlo sin daar la piel o la herida.  Controle la herida [employer] para detectar signos de infeccin. Pdale a un mdico que lo haga si no puede hacerlo usted mismo. Est atento a lo siguiente: ? Aumento del enrojecimiento, la hinchazn o Conservation officer, historic buildings. ? Ms lquido Delorise Shiner. ? Pus, sensacin de calor o mal olor. Medicamentos  [employer] de venta libre y los recetados solamente como se lo haya indicado el mdico.  Si le recetaron un antibitico, tmelo o aplquelo como se lo haya indicado el mdico. No deje de tomar o de usar el antibitico aunque la afeccin mejore. Actividad  No permanezca de pie o sentado en una misma posicin [employer]. Descanse con [place]. Si es posible, mantenga las piernas elevadas por arriba del nivel del corazn durante 30 minutos, 3 a 4 veces por da, o como se lo haya indicado el mdico.  No se siente con las piernas cruzadas.  Camine con frecuencia para aumentar la circulacin de sangre en las piernas. Pregntele al mdico qu nivel de actividad es seguro para usted.  Si realiza  un viaje largo en automvil o en avin, prese y camine por lo menos una vez cada dos horas o con la frecuencia que le recomiende el mdico. Pregntele al mdico si debe tomar aspirinas antes de realizar viajes largos. Instrucciones generales  Use medias elsticas, medias de compresin o medias de descanso como se lo haya indicado el mdico. Esto es muy importante.  Eleve el pie de la cama como se lo haya indicado el mdico.  No fume.  Concurra a todas las visitas de control como se lo haya indicado el mdico. Esto es importante. SOLICITE AYUDA SI:  Tiene fiebre.  La lcera se est agrandando o no cicatriza.  El dolor [place].  Aumentan el enrojecimiento o la hinchazn alrededor de Careers adviser.  Emana ms lquido, sangre o pus de la lcera despus de que el mdico la haya limpiado.  Tiene sensacin de calor en la lcera o esta tiene mal olor. Esta informacin no tiene Marine scientist el consejo del mdico. Asegrese de hacerle al mdico cualquier pregunta que tenga. Document Released: 03/15/2011 Document Revised: 07/02/2015 Document Reviewed: 02/19/2015 Elsevier Interactive Patient Education  [employer].

## 2018-09-25 NOTE — Progress Notes (Signed)
Amy Aguilar - 42 y.o. female MRN [id number]  Date of birth: 07-29-1976  Office Visit Note: Visit Date: 09/20/2018 PCP: Antony Blackbird, MD Referred by: Antony Blackbird, MD  Subjective: Chief Complaint  Patient presents with  . Right Forearm - Pain  . Right Hand - Numbness, Tingling   HPI: Amy Aguilar is a 42 y.o. female who comes in today At the request of Dr. Colen Darling and Tawanna Cooler, PA-C for electrodiagnostic study of the right upper limb.  Patient had carpal tunnel release performed by Dr. Eduard Roux on June 28, 2018.  Prior to that she was having intermittent numbness and tingling and a pretty classic median nerve distribution according to the patient and Dr. Phoebe Sharps notes.  Evidently there was an electrodiagnostic study that have been performed and per Dr. Phoebe Sharps notes this was moderate median nerve neuropathy.  Unfortunately, the actual report at this point seems to be lost.  The patient states she went back to where she had the study performed and there are no longer there.  She cannot remember the name of the facility.  Typically they would have to keep those medical records for a length of time but unfortunately we do not have the prior study to review.  Nonetheless the patient reports that after the surgery fairly abruptly she was having worsening symptoms of profound numbness particularly into the left first digit.  At one point she even burned the tip of her finger because she could not feel it at all.  She reports that this is worse than prior to the surgery and is more constant.  It does not seem to be affecting the middle finger quite as much.  She states that prior to the surgery it did go over into the ring finger.  Now she feels it more in the radial side of the middle digit and predominantly the index finger.  She does get numbness in the thumb.  She denies any neck pain or cervical radicular type pain.  She does report radiating symptoms up into the  forearm and elbow.  She reports weakness with trying to grab things and just inability to manipulate small objects.  She still feels like she gets some help using the brace.  She is right-hand dominant.  ROS Otherwise per HPI.  Assessment & Plan: Visit Diagnoses:  1. 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  There is evidence of axonal damage but there is active motor unit action potentials on needle EMG which portends better outcome depending on treatment.  Interestingly clinical exam shows decent sensation into the ring finger with some decreased sensation in the middle digit but profound decreased sensation in the index finger.  This is also seen on the sensory nerve conduction study to the middle digit which is really only mildly slowing but when testing the index finger the sensory nerve action potential is absent.  The motor nerve conduction study was almost absent as well.  There seems to be some specific injury to the median nerve which would be different than normal nerve conduction findings for playing median entrapment at the wrist.  Prior nerve study was reported as moderate median neuropathy so this clearly represents worsening compared to that study although we do not have that to review.   There is no significant electrodiagnostic evidence of any other focal nerve  entrapment, brachial plexopathy or cervical radiculopathy.   Recommendations: 1.  Follow-up with referring physician. 2.  Continue current management of symptoms. 3.  Continue use of resting splint at night-time and as needed during the day. 4.  Suggest surgical evaluation perhaps ultrasound imaging along the nerve path.   Meds & Orders: No orders of the defined types were placed in this encounter.   Orders Placed This Encounter  Procedures  . NCV with EMG  (electromyography)    Follow-up: Return for  Eduard Roux, M.D..   Procedures: No procedures performed  EMG & NCV Findings: Evaluation of the right median motor nerve showed prolonged distal onset latency (7.5 ms), reduced amplitude (0.0 mV), and decreased conduction velocity (Elbow-Wrist, 26 m/s).  The right median (across palm) sensory nerve showed prolonged distal peak latency (Wrist, 4.1 ms).  All remaining nerves (as indicated in the following tables) were within normal limits.    Needle evaluation of the right abductor pollicis brevis muscle showed increased spontaneous activity and diminished recruitment.  All remaining muscles (as indicated in the following table) showed no evidence of electrical instability.    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  There is evidence of axonal damage but there is active motor unit action potentials on needle EMG which portends better outcome depending on treatment.  Interestingly clinical exam shows decent sensation into the ring finger with some decreased sensation in the middle digit but profound decreased sensation in the index finger.  This is also seen on the sensory nerve conduction study to the middle digit which is really only mildly slowing but when testing the index finger the sensory nerve action potential is absent.  The motor nerve conduction study was almost absent as well.  There seems to be some specific injury to the median nerve which would be different than normal nerve conduction findings for playing median entrapment at the wrist.  Prior nerve study was reported as moderate median neuropathy so this clearly represents worsening compared to that study although we do not have that to review.   There is no significant electrodiagnostic evidence of any other focal nerve entrapment, brachial plexopathy or  cervical radiculopathy.   Recommendations: 1.  Follow-up with referring physician. 2.  Continue current management of symptoms. 3.  Continue use of resting splint at night-time and as needed during the day. 4.  Suggest surgical evaluation perhaps ultrasound imaging along the nerve path.  ___________________________ Wonda Olds Board Certified, American Board of Physical Medicine and Rehabilitation    Nerve Conduction Studies Anti Sensory Summary Table   Stim Site NR Peak (ms) Norm Peak (ms) P-T Amp (V) Norm P-T Amp Site1 Site2 Delta-P (ms) Dist (cm) Vel (m/s) Norm Vel (m/s)  Right Median Acr Palm Anti Sensory (2nd Digit)  32.2C  Wrist    *4.1 <3.6 13.2 >10 Wrist Palm 2.3 0.0    Palm    1.8 <2.0 7.1         Site 3    11.2  7.7         Right Radial Anti Sensory (Base 1st Digit)  32.1C  Wrist    2.0 <3.1 49.4  Wrist Base 1st Digit 2.0 0.0    Right Ulnar Anti Sensory (5th Digit)  32.1C  Wrist    2.9 <3.7 32.4 >15.0 Wrist 5th Digit 2.9 14.0 48 >38   Motor Summary Table  Stim Site NR Onset (ms) Norm Onset (ms) O-P Amp (mV) Norm O-P Amp Site1 Site2 Delta-0 (ms) Dist (cm) Vel (m/s) Norm Vel (m/s)  Right Median Motor (Abd Poll Brev)  32.2C  Wrist    *7.5 <4.2 *0.0 >5 Elbow Wrist 7.7 20.0 *26 >50  Elbow    15.2  0.0  Axilla Elbow 7.5 0.0    Axilla    7.7  0.1         Right Ulnar Motor (Abd Dig Min)  32.1C  Wrist    2.8 <4.2 9.4 >3 B Elbow Wrist 2.8 18.5 66 >53  B Elbow    5.6  9.2  A Elbow B Elbow 1.1 9.0 82 >53  A Elbow    6.7  9.8          EMG   Side Muscle Nerve Root Ins Act Fibs Psw Amp Dur Poly Recrt Int Fraser Din Comment  Right Abd Poll Brev Median C8-T1 Nml *3+ *3+ Nml Nml 0 *Reduced Nml + MUAPS  Right 1stDorInt Ulnar C8-T1 Nml Nml Nml Nml Nml 0 Nml Nml   Right PronatorTeres Median C6-7 Nml Nml Nml Nml Nml 0 Nml Nml   Right Biceps Musculocut C5-6 Nml Nml Nml Nml Nml 0 Nml Nml   Right Deltoid Axillary C5-6 Nml Nml Nml Nml Nml 0 Nml Nml     Nerve Conduction  Studies Anti Sensory Left/Right Comparison   Stim Site L Lat (ms) R Lat (ms) L-R Lat (ms) L Amp (V) R Amp (V) L-R Amp (%) Site1 Site2 L Vel (m/s) R Vel (m/s) L-R Vel (m/s)  Median Acr Palm Anti Sensory (2nd Digit)  32.2C  Wrist  *4.1   13.2  Wrist Palm     Palm  1.8   7.1        Site 3  11.2   7.7        Radial Anti Sensory (Base 1st Digit)  32.1C  Wrist  2.0   49.4  Wrist Base 1st Digit     Ulnar Anti Sensory (5th Digit)  32.1C  Wrist  2.9   32.4  Wrist 5th Digit  48    Motor Left/Right Comparison   Stim Site L Lat (ms) R Lat (ms) L-R Lat (ms) L Amp (mV) R Amp (mV) L-R Amp (%) Site1 Site2 L Vel (m/s) R Vel (m/s) L-R Vel (m/s)  Median Motor (Abd Poll Brev)  32.2C  Wrist  *7.5   *0.0  Elbow Wrist  *26   Elbow  15.2   0.0  Axilla Elbow     Axilla  7.7   0.1        Ulnar Motor (Abd Dig Min)  32.1C  Wrist  2.8   9.4  B Elbow Wrist  66   B Elbow  5.6   9.2  A Elbow B Elbow  82   A Elbow  6.7   9.8           Waveforms:            Clinical History: No specialty comments available.   She reports that she has never smoked. She has never used smokeless tobacco. No results for input(s): HGBA1C, LABURIC in the last 8760 hours.  Objective:  VS:  HT:    WT:   BMI:     BP:   HR: bpm  TEMP: ( )  RESP:  Physical Exam  Constitutional: She is oriented to person, place, and time.  Musculoskeletal: She  exhibits edema. She exhibits no tenderness.  Inspection reveals well-healed surgical scar over the volar wrist without swelling or induration and some flattening of the right APB but no atrophy of the bilateral FDI or hand intrinsics. There is no swelling, color changes, allodynia or dystrophic changes. There is 5 out of 5 strength in the bilateral wrist extension, finger abduction and long finger flexion.  There is decreased sensation in the right median nerve distribution except for the fact that she has more intact sensation on the ulnar side of the middle digit and then this seems  to be equal to the sensation of the ring finger and little finger.  There is a negative Hoffmann's test bilaterally.  Neurological: She is alert and oriented to person, place, and time. She exhibits normal muscle tone. Coordination normal.  Skin: Skin is warm and dry. No erythema.    Ortho Exam Imaging: No results found.  Past Medical/Family/Surgical/Social History: Medications & Allergies reviewed per EMR, new medications updated. Patient Active Problem List   Diagnosis Date Noted  . Migraine with aura and without status migrainosus, not intractable 09/25/2018  . S/P carpal tunnel release 07/12/2018  . Dizziness 02/21/2018  . Headache 02/21/2018  . Varicose veins of bilateral lower extremities with pain 04/13/2017  . Class 1 obesity without serious comorbidity with body mass index (BMI) of 34.0 to 34.9 in adult 04/13/2017  . Carpal tunnel syndrome, left 09/14/2016  . Carpal tunnel syndrome, right upper limb 09/14/2016  . Fibroids 12/05/2014  . Leiomyoma of uterus 11/21/2014  . Anemia 11/21/2014  . Menorrhagia 09/18/2012   Past Medical History:  Diagnosis Date  . Abnormal uterine bleeding (AUB)   . Anemia    received blood transfusion 2 wks ago  . Blood transfusion without reported diagnosis   . Headache    migraines  . Leiomyoma of uterus   . Uterine fibroid    Family History  Problem Relation Age of Onset  . Hypertension Father    Past Surgical History:  Procedure Laterality Date  . ABDOMINAL HYSTERECTOMY    . BILATERAL SALPINGECTOMY Bilateral 12/05/2014   Procedure: BILATERAL SALPINGECTOMY;  Surgeon: Margarette Asal, MD;  Location: [facility];  Service: Gynecology;  Laterality: Bilateral;  . CARPAL TUNNEL RELEASE Right 06/28/2018   Procedure: RIGHT CARPAL TUNNEL RELEASE;  Surgeon: Leandrew Koyanagi, MD;  Location: [place];  Service: Orthopedics;  Laterality: Right;  . LAPAROSCOPIC ASSISTED VAGINAL HYSTERECTOMY N/A 12/05/2014   Procedure:  LAPAROSCOPY, ATTEMPTED VAGINAL HYSTERECTOMY, TOTAL ABDOMINAL HYSTERECTOMY;  Surgeon: Margarette Asal, MD;  Location: [place];  Service: Gynecology;  Laterality: N/A;   Social History   Occupational History  . Not on file  Tobacco Use  . Smoking status: Never Smoker  . Smokeless tobacco: Never Used  Substance and Sexual Activity  . Alcohol use: No  . Drug use: No  . Sexual activity: Yes    Birth control/protection: Surgical    Comment: hysterectomy

## 2018-09-26 ENCOUNTER — Ambulatory Visit (INDEPENDENT_AMBULATORY_CARE_PROVIDER_SITE_OTHER): Payer: BLUE CROSS/BLUE SHIELD | Admitting: Orthopaedic Surgery

## 2018-09-26 ENCOUNTER — Encounter (INDEPENDENT_AMBULATORY_CARE_PROVIDER_SITE_OTHER): Payer: Self-pay | Admitting: Orthopaedic Surgery

## 2018-09-26 DIAGNOSIS — G5601 Carpal tunnel syndrome, right upper limb: Secondary | ICD-10-CM

## 2018-09-26 MED FILL — RIZATRIPTAN BENZOATE 10 MG: 10 | 15 days supply | Qty: 10 | Fill #0

## 2018-09-26 NOTE — Procedures (Signed)
EMG & NCV Findings: Evaluation of the right median motor nerve showed prolonged distal onset latency (7.5 ms), reduced amplitude (0.0 mV), and decreased conduction velocity (Elbow-Wrist, 26 m/s).  The right median (across palm) sensory nerve showed prolonged distal peak latency (Wrist, 4.1 ms).  All remaining nerves (as indicated in the following tables) were within normal limits.    Needle evaluation of the right abductor pollicis brevis muscle showed increased spontaneous activity and diminished recruitment.  All remaining muscles (as indicated in the following table) showed no evidence of electrical instability.    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  There is evidence of axonal damage but there is active motor unit action potentials on needle EMG which portends better outcome depending on treatment.  Interestingly clinical exam shows decent sensation into the ring finger with some decreased sensation in the middle digit but profound decreased sensation in the index finger.  This is also seen on the sensory nerve conduction study to the middle digit which is really only mildly slowing but when testing the index finger the sensory nerve action potential is absent.  The motor nerve conduction study was almost absent as well.  There seems to be some specific injury to the median nerve which would be different than normal nerve conduction findings for playing median entrapment at the wrist.  Prior nerve study was reported as moderate median neuropathy so this clearly represents worsening compared to that study although we do not have that to review.   There is no significant electrodiagnostic evidence of any other focal nerve entrapment, brachial plexopathy or cervical radiculopathy.   Recommendations: 1.  Follow-up with referring physician. 2.  Continue current  management of symptoms. 3.  Continue use of resting splint at night-time and as needed during the day. 4.  Suggest surgical evaluation perhaps ultrasound imaging along the nerve path.  ___________________________ Amy Aguilar Board Certified, American Board of Physical Medicine and Rehabilitation    Nerve Conduction Studies Anti Sensory Summary Table   Stim Site NR Peak (ms) Norm Peak (ms) P-T Amp (V) Norm P-T Amp Site1 Site2 Delta-P (ms) Dist (cm) Vel (m/s) Norm Vel (m/s)  Right Median Acr Palm Anti Sensory (2nd Digit)  32.2C  Wrist    *4.1 <3.6 13.2 >10 Wrist Palm 2.3 0.0    Palm    1.8 <2.0 7.1         Site 3    11.2  7.7         Right Radial Anti Sensory (Base 1st Digit)  32.1C  Wrist    2.0 <3.1 49.4  Wrist Base 1st Digit 2.0 0.0    Right Ulnar Anti Sensory (5th Digit)  32.1C  Wrist    2.9 <3.7 32.4 >15.0 Wrist 5th Digit 2.9 14.0 48 >38   Motor Summary Table   Stim Site NR Onset (ms) Norm Onset (ms) O-P Amp (mV) Norm O-P Amp Site1 Site2 Delta-0 (ms) Dist (cm) Vel (m/s) Norm Vel (m/s)  Right Median Motor (Abd Poll Brev)  32.2C  Wrist    *7.5 <4.2 *0.0 >5 Elbow Wrist 7.7 20.0 *26 >50  Elbow    15.2  0.0  Axilla Elbow 7.5 0.0    Axilla    7.7  0.1         Right Ulnar Motor (Abd Dig Min)  32.1C  Wrist    2.8 <4.2 9.4 >  3 B Elbow Wrist 2.8 18.5 66 >53  B Elbow    5.6  9.2  A Elbow B Elbow 1.1 9.0 82 >53  A Elbow    6.7  9.8          EMG   Side Muscle Nerve Root Ins Act Fibs Psw Amp Dur Poly Recrt Int Fraser Din Comment  Right Abd Poll Brev Median C8-T1 Nml *3+ *3+ Nml Nml 0 *Reduced Nml + MUAPS  Right 1stDorInt Ulnar C8-T1 Nml Nml Nml Nml Nml 0 Nml Nml   Right PronatorTeres Median C6-7 Nml Nml Nml Nml Nml 0 Nml Nml   Right Biceps Musculocut C5-6 Nml Nml Nml Nml Nml 0 Nml Nml   Right Deltoid Axillary C5-6 Nml Nml Nml Nml Nml 0 Nml Nml     Nerve Conduction Studies Anti Sensory Left/Right Comparison   Stim Site L Lat (ms) R Lat (ms) L-R Lat (ms) L Amp (V) R Amp (V)  L-R Amp (%) Site1 Site2 L Vel (m/s) R Vel (m/s) L-R Vel (m/s)  Median Acr Palm Anti Sensory (2nd Digit)  32.2C  Wrist  *4.1   13.2  Wrist Palm     Palm  1.8   7.1        Site 3  11.2   7.7        Radial Anti Sensory (Base 1st Digit)  32.1C  Wrist  2.0   49.4  Wrist Base 1st Digit     Ulnar Anti Sensory (5th Digit)  32.1C  Wrist  2.9   32.4  Wrist 5th Digit  48    Motor Left/Right Comparison   Stim Site L Lat (ms) R Lat (ms) L-R Lat (ms) L Amp (mV) R Amp (mV) L-R Amp (%) Site1 Site2 L Vel (m/s) R Vel (m/s) L-R Vel (m/s)  Median Motor (Abd Poll Brev)  32.2C  Wrist  *7.5   *0.0  Elbow Wrist  *26   Elbow  15.2   0.0  Axilla Elbow     Axilla  7.7   0.1        Ulnar Motor (Abd Dig Min)  32.1C  Wrist  2.8   9.4  B Elbow Wrist  66   B Elbow  5.6   9.2  A Elbow B Elbow  82   A Elbow  6.7   9.8           Waveforms:

## 2018-09-26 NOTE — Progress Notes (Signed)
   Post-Op Visit Note   Patient: Amy Aguilar           Date of Birth: 1975-11-17           MRN: [id number] Visit Date: 09/26/2018 PCP: Antony Blackbird, MD   Assessment & Plan:  Chief Complaint:  Chief Complaint  Patient presents with  . Right Wrist - Follow-up   Visit Diagnoses:  1. Carpal tunnel syndrome, right     Plan: Patient follows up today for nerve conduction study review.  Her burn on her right index finger is healing she has no sensation in her index finger or on the ulnar aspect of the thumb.  She has decreased sensation on the radial aspect of the long finger and she has normal sensation on the ulnar aspect of the long finger and ring and small finger.  She does have weakness with APB muscle function.  Mild thenar flattening.  At this point I reviewed the nerve conduction studies and I recommend referral to Dr. Leanora Cover for further evaluation and treatment as I am concerned that she has a partial median nerve injury.  Her carpal tunnel release was 90 days ago.  Follow-Up Instructions: Return if symptoms worsen or fail to improve.   Orders:  Orders Placed This Encounter  Procedures  . Ambulatory referral to Orthopedic Surgery   No orders of the defined types were placed in this encounter.   Imaging: No results found.  PMFS History: Patient Active Problem List   Diagnosis Date Noted  . Migraine with aura and without status migrainosus, not intractable 09/25/2018  . S/P carpal tunnel release 07/12/2018  . Dizziness 02/21/2018  . Headache 02/21/2018  . Varicose veins of bilateral lower extremities with pain 04/13/2017  . Class 1 obesity without serious comorbidity with body mass index (BMI) of 34.0 to 34.9 in adult 04/13/2017  . Carpal tunnel syndrome, left 09/14/2016  . Carpal tunnel syndrome, right upper limb 09/14/2016  . Fibroids 12/05/2014  . Leiomyoma of uterus 11/21/2014  . Anemia 11/21/2014  . Menorrhagia 09/18/2012   Past Medical  History:  Diagnosis Date  . Abnormal uterine bleeding (AUB)   . Anemia    received blood transfusion 2 wks ago  . Blood transfusion without reported diagnosis   . Headache    migraines  . Leiomyoma of uterus   . Uterine fibroid     Family History  Problem Relation Age of Onset  . Hypertension Father     Past Surgical History:  Procedure Laterality Date  . ABDOMINAL HYSTERECTOMY    . BILATERAL SALPINGECTOMY Bilateral 12/05/2014   Procedure: BILATERAL SALPINGECTOMY;  Surgeon: Margarette Asal, MD;  Location: [facility];  Service: Gynecology;  Laterality: Bilateral;  . CARPAL TUNNEL RELEASE Right 06/28/2018   Procedure: RIGHT CARPAL TUNNEL RELEASE;  Surgeon: Leandrew Koyanagi, MD;  Location: [place];  Service: Orthopedics;  Laterality: Right;  . LAPAROSCOPIC ASSISTED VAGINAL HYSTERECTOMY N/A 12/05/2014   Procedure: LAPAROSCOPY, ATTEMPTED VAGINAL HYSTERECTOMY, TOTAL ABDOMINAL HYSTERECTOMY;  Surgeon: Margarette Asal, MD;  Location: [place];  Service: Gynecology;  Laterality: N/A;   Social History   Occupational History  . Not on file  Tobacco Use  . Smoking status: Never Smoker  . Smokeless tobacco: Never Used  Substance and Sexual Activity  . Alcohol use: No  . Drug use: No  . Sexual activity: Yes    Birth control/protection: Surgical    Comment: hysterectomy

## 2018-10-05 ENCOUNTER — Telehealth (INDEPENDENT_AMBULATORY_CARE_PROVIDER_SITE_OTHER): Payer: Self-pay | Admitting: Orthopaedic Surgery

## 2018-10-05 NOTE — Telephone Encounter (Signed)
I would try gabapentin 100-300 mg tid for now

## 2018-10-05 NOTE — Telephone Encounter (Signed)
Patient called she is waiting for a return call from the office. She stated she had carpal tunnel surgery and she is still have pain and numbness. She would like to know what should she do while waiting to be scheduled by the office with another doctor.

## 2018-10-05 NOTE — Telephone Encounter (Signed)
See message below °

## 2018-10-06 ENCOUNTER — Other Ambulatory Visit (INDEPENDENT_AMBULATORY_CARE_PROVIDER_SITE_OTHER): Payer: Self-pay

## 2018-10-06 MED ORDER — GABAPENTIN 100 MG PO CAPS: ORAL_CAPSULE | ORAL | 0 refills | Status: DC

## 2018-10-06 NOTE — Telephone Encounter (Signed)
Called in Rx into pharm. Patient aware.

## 2018-10-11 ENCOUNTER — Other Ambulatory Visit: Payer: Self-pay

## 2018-10-11 DIAGNOSIS — I83813 Varicose veins of bilateral lower extremities with pain: Secondary | ICD-10-CM

## 2018-10-31 MED FILL — RIZATRIPTAN BENZOATE 10 MG: 10 | 15 days supply | Qty: 10 | Fill #0

## 2018-11-03 ENCOUNTER — Ambulatory Visit (INDEPENDENT_AMBULATORY_CARE_PROVIDER_SITE_OTHER): Payer: BLUE CROSS/BLUE SHIELD | Admitting: Orthopaedic Surgery

## 2018-11-03 ENCOUNTER — Encounter (INDEPENDENT_AMBULATORY_CARE_PROVIDER_SITE_OTHER): Payer: Self-pay | Admitting: Orthopaedic Surgery

## 2018-11-03 DIAGNOSIS — G5601 Carpal tunnel syndrome, right upper limb: Secondary | ICD-10-CM

## 2018-11-03 NOTE — Progress Notes (Signed)
      Patient: Amy Aguilar           Date of Birth: [birth year]           MRN: [id number] Visit Date: 11/03/2018 PCP: Antony Blackbird, MD   Assessment & Plan:  Chief Complaint:  Chief Complaint  Patient presents with  . Right Hand - Numbness, Pain, Weakness   Visit Diagnoses:  1. Carpal tunnel syndrome, right upper limb     Plan: Patient comes in for follow-up of her right hand.  Right carpal tunnel release little over 4 months ago.  She has had no sensation to the right index and ulnar side of the thumb following surgery.  We have recently made a referral to Dr. Fredna Dow for further evaluation and treatment recommendation as we are concerned for a partial median nerve injury.  In the meantime, we will fill out detailed paperwork for her job.  At this point, she is unable to safely return to work.  Follow-Up Instructions: Return if symptoms worsen or fail to improve.   Orders:  No orders of the defined types were placed in this encounter.  No orders of the defined types were placed in this encounter.   Imaging: No new imaging  PMFS History: Patient Active Problem List   Diagnosis Date Noted  . Migraine with aura and without status migrainosus, not intractable 09/25/2018  . S/P carpal tunnel release 07/12/2018  . Dizziness 02/21/2018  . Headache 02/21/2018  . Varicose veins of bilateral lower extremities with pain 04/13/2017  . Class 1 obesity without serious comorbidity with body mass index (BMI) of 34.0 to 34.9 in adult 04/13/2017  . Carpal tunnel syndrome, left 09/14/2016  . Carpal tunnel syndrome, right upper limb 09/14/2016  . Fibroids 12/05/2014  . Leiomyoma of uterus 11/21/2014  . Anemia 11/21/2014  . Menorrhagia 09/18/2012   Past Medical History:  Diagnosis Date  . Abnormal uterine bleeding (AUB)   . Anemia    received blood transfusion 2 wks ago  . Blood transfusion without reported diagnosis   . Headache    migraines  . Leiomyoma of uterus     . Uterine fibroid     Family History  Problem Relation Age of Onset  . Hypertension Father     Past Surgical History:  Procedure Laterality Date  . ABDOMINAL HYSTERECTOMY    . BILATERAL SALPINGECTOMY Bilateral 12/05/2014   Procedure: BILATERAL SALPINGECTOMY;  Surgeon: Margarette Asal, MD;  Location: [facility];  Service: Gynecology;  Laterality: Bilateral;  . CARPAL TUNNEL RELEASE Right 06/28/2018   Procedure: RIGHT CARPAL TUNNEL RELEASE;  Surgeon: Leandrew Koyanagi, MD;  Location: [place];  Service: Orthopedics;  Laterality: Right;  . LAPAROSCOPIC ASSISTED VAGINAL HYSTERECTOMY N/A 12/05/2014   Procedure: LAPAROSCOPY, ATTEMPTED VAGINAL HYSTERECTOMY, TOTAL ABDOMINAL HYSTERECTOMY;  Surgeon: Margarette Asal, MD;  Location: [place];  Service: Gynecology;  Laterality: N/A;   Social History   Occupational History  . Not on file  Tobacco Use  . Smoking status: Never Smoker  . Smokeless tobacco: Never Used  Substance and Sexual Activity  . Alcohol use: No  . Drug use: No  . Sexual activity: Yes    Birth control/protection: Surgical    Comment: hysterectomy

## 2018-11-16 ENCOUNTER — Other Ambulatory Visit: Payer: Self-pay | Admitting: Orthopedic Surgery

## 2018-11-16 DIAGNOSIS — G5611 Other lesions of median nerve, right upper limb: Secondary | ICD-10-CM

## 2018-11-21 ENCOUNTER — Ambulatory Visit
Admission: RE | Admit: 2018-11-21 | Discharge: 2018-11-21 | Disposition: A | Discharge status: Home / Self Care | Payer: No Typology Code available for payment source | Source: Ambulatory Visit | Attending: Orthopedic Surgery | Admitting: Orthopedic Surgery

## 2018-11-21 DIAGNOSIS — G5611 Other lesions of median nerve, right upper limb: Secondary | ICD-10-CM

## 2018-11-27 ENCOUNTER — Inpatient Hospital Stay (HOSPITAL_COMMUNITY): Admission: RE | Admit: 2018-11-27 | Payer: BLUE CROSS/BLUE SHIELD | Source: Ambulatory Visit

## 2018-11-27 ENCOUNTER — Encounter: Payer: BLUE CROSS/BLUE SHIELD | Admitting: Surgery

## 2018-11-27 ENCOUNTER — Other Ambulatory Visit: Payer: Self-pay | Admitting: Orthopedic Surgery

## 2018-12-27 ENCOUNTER — Other Ambulatory Visit: Payer: Self-pay

## 2018-12-27 ENCOUNTER — Encounter (HOSPITAL_BASED_OUTPATIENT_CLINIC_OR_DEPARTMENT_OTHER): Payer: Self-pay

## 2019-01-05 ENCOUNTER — Telehealth (INDEPENDENT_AMBULATORY_CARE_PROVIDER_SITE_OTHER): Payer: Self-pay | Admitting: Orthopaedic Surgery

## 2019-01-05 NOTE — Telephone Encounter (Signed)
refaxed to [employer]) advising that we do not have records within requested dates on their request. I originally faxed 3/10

## 2019-01-19 ENCOUNTER — Telehealth (INDEPENDENT_AMBULATORY_CARE_PROVIDER_SITE_OTHER): Payer: Self-pay | Admitting: Orthopaedic Surgery

## 2019-01-19 NOTE — Telephone Encounter (Signed)
Advised [employer] one again that there are no records within theie requested date range.

## 2019-01-25 ENCOUNTER — Ambulatory Visit: Payer: BLUE CROSS/BLUE SHIELD | Admitting: Family Medicine

## 2019-02-01 ENCOUNTER — Ambulatory Visit (HOSPITAL_BASED_OUTPATIENT_CLINIC_OR_DEPARTMENT_OTHER): Admit: 2019-02-01 | Payer: BLUE CROSS/BLUE SHIELD | Admitting: Orthopedic Surgery

## 2019-02-01 SURGERY — CARPAL TUNNEL RELEASE
Anesthesia: Choice | Laterality: Right

## 2019-03-01 ENCOUNTER — Other Ambulatory Visit: Payer: Self-pay

## 2019-03-01 ENCOUNTER — Encounter (HOSPITAL_BASED_OUTPATIENT_CLINIC_OR_DEPARTMENT_OTHER): Payer: Self-pay | Admitting: *Deleted

## 2019-03-01 ENCOUNTER — Other Ambulatory Visit: Payer: Self-pay | Admitting: Orthopedic Surgery

## 2019-03-02 ENCOUNTER — Other Ambulatory Visit: Payer: Self-pay | Admitting: Orthopedic Surgery

## 2019-03-05 ENCOUNTER — Other Ambulatory Visit (HOSPITAL_COMMUNITY): Payer: BLUE CROSS/BLUE SHIELD

## 2019-03-06 ENCOUNTER — Other Ambulatory Visit (HOSPITAL_COMMUNITY)
Admission: RE | Admit: 2019-03-06 | Discharge: 2019-03-06 | Disposition: A | Discharge status: Home / Self Care | Payer: Medicaid Other | Source: Ambulatory Visit | Attending: Orthopedic Surgery | Admitting: Orthopedic Surgery

## 2019-03-06 DIAGNOSIS — Z1159 Encounter for screening for other viral diseases: Secondary | ICD-10-CM | POA: Diagnosis not present

## 2019-03-07 LAB — NOVEL CORONAVIRUS, NAA (HOSP ORDER, SEND-OUT TO REF LAB; TAT 18-24 HRS): SARS-CoV-2, NAA: NOT DETECTED

## 2019-03-08 ENCOUNTER — Encounter (HOSPITAL_BASED_OUTPATIENT_CLINIC_OR_DEPARTMENT_OTHER)
Admission: RE | Disposition: A | Discharge status: Home / Self Care | Payer: Self-pay | Source: Home / Self Care | Attending: Orthopedic Surgery

## 2019-03-08 ENCOUNTER — Ambulatory Visit (HOSPITAL_BASED_OUTPATIENT_CLINIC_OR_DEPARTMENT_OTHER)
Admission: RE | Admit: 2019-03-08 | Discharge: 2019-03-08 | Disposition: A | Discharge status: Home / Self Care | Payer: Medicaid Other | Attending: Orthopedic Surgery | Admitting: Orthopedic Surgery

## 2019-03-08 ENCOUNTER — Emergency Department (HOSPITAL_COMMUNITY)
Admission: EM | Admit: 2019-03-08 | Discharge: 2019-03-09 | Disposition: A | Discharge status: Home / Self Care | Payer: Medicaid Other | Attending: Emergency Medicine | Admitting: Emergency Medicine

## 2019-03-08 ENCOUNTER — Encounter (HOSPITAL_BASED_OUTPATIENT_CLINIC_OR_DEPARTMENT_OTHER): Payer: Self-pay | Admitting: Anesthesiology

## 2019-03-08 ENCOUNTER — Emergency Department (HOSPITAL_COMMUNITY): Payer: Medicaid Other

## 2019-03-08 ENCOUNTER — Ambulatory Visit (HOSPITAL_BASED_OUTPATIENT_CLINIC_OR_DEPARTMENT_OTHER): Payer: Medicaid Other | Admitting: Anesthesiology

## 2019-03-08 ENCOUNTER — Other Ambulatory Visit: Payer: Self-pay

## 2019-03-08 ENCOUNTER — Encounter (HOSPITAL_COMMUNITY): Payer: Self-pay | Admitting: *Deleted

## 2019-03-08 DIAGNOSIS — Z9889 Other specified postprocedural states: Secondary | ICD-10-CM | POA: Diagnosis not present

## 2019-03-08 DIAGNOSIS — M546 Pain in thoracic spine: Secondary | ICD-10-CM | POA: Diagnosis not present

## 2019-03-08 DIAGNOSIS — D649 Anemia, unspecified: Secondary | ICD-10-CM | POA: Diagnosis not present

## 2019-03-08 DIAGNOSIS — R0602 Shortness of breath: Secondary | ICD-10-CM | POA: Diagnosis not present

## 2019-03-08 DIAGNOSIS — D259 Leiomyoma of uterus, unspecified: Secondary | ICD-10-CM | POA: Diagnosis not present

## 2019-03-08 DIAGNOSIS — G5601 Carpal tunnel syndrome, right upper limb: Secondary | ICD-10-CM | POA: Insufficient documentation

## 2019-03-08 DIAGNOSIS — S6411XA Injury of median nerve at wrist and hand level of right arm, initial encounter: Secondary | ICD-10-CM | POA: Diagnosis not present

## 2019-03-08 DIAGNOSIS — G43109 Migraine with aura, not intractable, without status migrainosus: Secondary | ICD-10-CM | POA: Diagnosis not present

## 2019-03-08 HISTORY — PX: NERVE REPAIR: SHX2083

## 2019-03-08 LAB — BASIC METABOLIC PANEL WITH GFR
Anion gap: 11 (ref 5–15)
BUN: 9 mg/dL (ref 6–20)
CO2: 23 mmol/L (ref 22–32)
Calcium: 9 mg/dL (ref 8.9–10.3)
Chloride: 105 mmol/L (ref 98–111)
Creatinine, Ser: 0.94 mg/dL (ref 0.44–1.00)
GFR calc Af Amer: >60 mL/min
GFR calc non Af Amer: >60 mL/min
Glucose, Bld: 118 mg/dL — ABNORMAL HIGH (ref 70–99)
Potassium: 3.8 mmol/L (ref 3.5–5.1)
Sodium: 139 mmol/L (ref 135–145)

## 2019-03-08 LAB — CBC
HCT: 39.6 % (ref 36.0–46.0)
Hemoglobin: 12.9 g/dL (ref 12.0–15.0)
MCH: 29.9 pg (ref 26.0–34.0)
MCHC: 32.6 g/dL (ref 30.0–36.0)
MCV: 91.7 fL (ref 80.0–100.0)
Platelets: 227 10*3/uL (ref 150–400)
RBC: 4.32 MIL/uL (ref 3.87–5.11)
RDW: 12.8 % (ref 11.5–15.5)
WBC: 11.5 10*3/uL — ABNORMAL HIGH (ref 4.0–10.5)
nRBC: 0 % (ref 0.0–0.2)

## 2019-03-08 SURGERY — REPAIR, NERVE
Anesthesia: Monitor Anesthesia Care | Site: Wrist | Laterality: Right

## 2019-03-08 MED ORDER — OXYCODONE HCL 5 MG/5ML PO SOLN: 5.0000 mg | Freq: Once | ORAL | Status: DC | PRN

## 2019-03-08 MED ORDER — EPHEDRINE 5 MG/ML INJ
INTRAVENOUS | Status: AC
  Filled 2019-03-08: qty 10

## 2019-03-08 MED ORDER — OXYCODONE HCL 5 MG PO TABS: 5.0000 mg | ORAL_TABLET | Freq: Once | ORAL | Status: DC | PRN

## 2019-03-08 MED ORDER — PROPOFOL 500 MG/50ML IV EMUL
INTRAVENOUS | Status: DC | PRN
  Administered 2019-03-08: 100 ug/kg/min via INTRAVENOUS

## 2019-03-08 MED ORDER — SCOPOLAMINE 1 MG/3DAYS TD PT72: 1.0000 | MEDICATED_PATCH | Freq: Once | TRANSDERMAL | Status: DC | PRN

## 2019-03-08 MED ORDER — FENTANYL CITRATE (PF) 100 MCG/2ML IJ SOLN
INTRAMUSCULAR | Status: AC
  Filled 2019-03-08: qty 2

## 2019-03-08 MED ORDER — SUCCINYLCHOLINE CHLORIDE 200 MG/10ML IV SOSY
PREFILLED_SYRINGE | INTRAVENOUS | Status: AC
  Filled 2019-03-08: qty 10

## 2019-03-08 MED ORDER — ONDANSETRON HCL 4 MG/2ML IJ SOLN: 4.0000 mg | Freq: Once | INTRAMUSCULAR | Status: DC | PRN

## 2019-03-08 MED ORDER — PHENYLEPHRINE 40 MCG/ML (10ML) SYRINGE FOR IV PUSH (FOR BLOOD PRESSURE SUPPORT)
PREFILLED_SYRINGE | INTRAVENOUS | Status: AC
  Filled 2019-03-08: qty 10

## 2019-03-08 MED ORDER — PHENYLEPHRINE HCL (PRESSORS) 10 MG/ML IV SOLN
INTRAVENOUS | Status: DC | PRN
  Administered 2019-03-08 (×2): 80 ug via INTRAVENOUS

## 2019-03-08 MED ORDER — LIDOCAINE 2% (20 MG/ML) 5 ML SYRINGE
INTRAMUSCULAR | Status: AC
  Filled 2019-03-08: qty 5

## 2019-03-08 MED ORDER — FENTANYL CITRATE (PF) 100 MCG/2ML IJ SOLN: 25.0000 ug | INTRAMUSCULAR | Status: DC | PRN

## 2019-03-08 MED ORDER — HYDROCODONE-ACETAMINOPHEN 5-325 MG PO TABS: ORAL_TABLET | ORAL | 0 refills | Status: DC

## 2019-03-08 MED ORDER — ROPIVACAINE HCL 5 MG/ML IJ SOLN
INTRAMUSCULAR | Status: DC | PRN
  Administered 2019-03-08: 30 mL via PERINEURAL

## 2019-03-08 MED ORDER — MIDAZOLAM HCL 2 MG/2ML IJ SOLN
INTRAMUSCULAR | Status: AC
  Filled 2019-03-08: qty 2

## 2019-03-08 MED ORDER — CLONIDINE HCL (ANALGESIA) 100 MCG/ML EP SOLN
EPIDURAL | Status: DC | PRN
  Administered 2019-03-08: 100 ug

## 2019-03-08 MED ORDER — CEFAZOLIN SODIUM-DEXTROSE 2-4 GM/100ML-% IV SOLN
2.0000 g | INTRAVENOUS | Status: AC
  Administered 2019-03-08: 09:00:00 2 g via INTRAVENOUS

## 2019-03-08 MED ORDER — FENTANYL CITRATE (PF) 100 MCG/2ML IJ SOLN
50.0000 ug | INTRAMUSCULAR | Status: AC | PRN
  Administered 2019-03-08: 10:00:00 50 ug via INTRAVENOUS
  Administered 2019-03-08: 09:00:00 100 ug via INTRAVENOUS
  Administered 2019-03-08: 10:00:00 50 ug via INTRAVENOUS

## 2019-03-08 MED ORDER — LACTATED RINGERS IV SOLN
INTRAVENOUS | Status: DC
  Administered 2019-03-08: 09:00:00 via INTRAVENOUS

## 2019-03-08 MED ORDER — MIDAZOLAM HCL 2 MG/2ML IJ SOLN
1.0000 mg | INTRAMUSCULAR | Status: DC | PRN
  Administered 2019-03-08 (×2): 1 mg via INTRAVENOUS
  Administered 2019-03-08: 09:00:00 2 mg via INTRAVENOUS

## 2019-03-08 MED ORDER — CEFAZOLIN SODIUM-DEXTROSE 2-4 GM/100ML-% IV SOLN
INTRAVENOUS | Status: AC
  Filled 2019-03-08: qty 100

## 2019-03-08 MED ORDER — ONDANSETRON HCL 4 MG/2ML IJ SOLN
INTRAMUSCULAR | Status: DC | PRN
  Administered 2019-03-08: 4 mg via INTRAVENOUS

## 2019-03-08 MED ORDER — ONDANSETRON HCL 4 MG/2ML IJ SOLN
INTRAMUSCULAR | Status: AC
  Filled 2019-03-08: qty 2

## 2019-03-08 MED ORDER — CHLORHEXIDINE GLUCONATE 4 % EX LIQD: 60.0000 mL | Freq: Once | CUTANEOUS | Status: DC

## 2019-03-08 SURGICAL SUPPLY — 68 items
APL PRP STRL LF DISP 70% ISPRP (MISCELLANEOUS) ×2
BAG DECANTER FOR FLEXI CONT (MISCELLANEOUS) IMPLANT
BANDAGE ACE 3X5.8 VEL STRL LF (GAUZE/BANDAGES/DRESSINGS) ×3 IMPLANT
BLADE MINI RND TIP GREEN BEAV (BLADE) IMPLANT
BLADE SURG 15 STRL LF DISP TIS (BLADE) ×4 IMPLANT
BLADE SURG 15 STRL SS (BLADE) ×6
BNDG CMPR 9X4 STRL LF SNTH (GAUZE/BANDAGES/DRESSINGS) ×2
BNDG ESMARK 4X9 LF (GAUZE/BANDAGES/DRESSINGS) ×2 IMPLANT
BNDG GAUZE ELAST 4 BULKY (GAUZE/BANDAGES/DRESSINGS) ×3 IMPLANT
BRUSH SCRUB EZ PLAIN DRY (MISCELLANEOUS) IMPLANT
CHLORAPREP W/TINT 26 (MISCELLANEOUS) ×3 IMPLANT
CORD BIPOLAR FORCEPS 12FT (ELECTRODE) ×3 IMPLANT
COVER BACK TABLE REUSABLE LG (DRAPES) ×3 IMPLANT
COVER MAYO STAND REUSABLE (DRAPES) ×3 IMPLANT
COVER WAND RF STERILE (DRAPES) IMPLANT
CUFF TOURN SGL QUICK 18X4 (TOURNIQUET CUFF) ×3 IMPLANT
DECANTER SPIKE VIAL GLASS SM (MISCELLANEOUS) ×3 IMPLANT
DRAPE EXTREMITY T 121X128X90 (DISPOSABLE) ×3 IMPLANT
DRAPE SURG 17X23 STRL (DRAPES) ×3 IMPLANT
DRSG PAD ABDOMINAL 8X10 ST (GAUZE/BANDAGES/DRESSINGS) ×3 IMPLANT
GAUZE SPONGE 4X4 12PLY STRL (GAUZE/BANDAGES/DRESSINGS) ×3 IMPLANT
GAUZE XEROFORM 1X8 LF (GAUZE/BANDAGES/DRESSINGS) ×3 IMPLANT
GLOVE BIO SURGEON STRL SZ7.5 (GLOVE) ×3 IMPLANT
GLOVE BIOGEL PI IND STRL 6.5 (GLOVE) ×1 IMPLANT
GLOVE BIOGEL PI IND STRL 8 (GLOVE) ×2 IMPLANT
GLOVE BIOGEL PI IND STRL 8.5 (GLOVE) ×1 IMPLANT
GLOVE BIOGEL PI INDICATOR 6.5 (GLOVE) ×1
GLOVE BIOGEL PI INDICATOR 8 (GLOVE) ×1
GLOVE BIOGEL PI INDICATOR 8.5 (GLOVE) ×1
GLOVE ECLIPSE 6.5 STRL STRAW (GLOVE) ×4 IMPLANT
GLOVE SURG ORTHO 8.0 STRL STRW (GLOVE) ×2 IMPLANT
GOWN STRL REUS W/ TWL LRG LVL3 (GOWN DISPOSABLE) ×2 IMPLANT
GOWN STRL REUS W/TWL LRG LVL3 (GOWN DISPOSABLE) ×3
GOWN STRL REUS W/TWL XL LVL3 (GOWN DISPOSABLE) ×3 IMPLANT
LOOP VESSEL MAXI BLUE (MISCELLANEOUS) IMPLANT
NDL HYPO 25X1 1.5 SAFETY (NEEDLE) ×1 IMPLANT
NDL SAFETY ECLIPSE 18X1.5 (NEEDLE) IMPLANT
NEEDLE HYPO 18GX1.5 SHARP (NEEDLE)
NEEDLE HYPO 25X1 1.5 SAFETY (NEEDLE) ×3 IMPLANT
NERVE PROTECTOR NEURAWRAP 7MM (Tissue) ×3 IMPLANT
NS IRRIG 1000ML POUR BTL (IV SOLUTION) ×3 IMPLANT
PACK BASIN DAY SURGERY FS (CUSTOM PROCEDURE TRAY) ×3 IMPLANT
PAD CAST 3X4 CTTN HI CHSV (CAST SUPPLIES) ×2 IMPLANT
PAD CAST 4YDX4 CTTN HI CHSV (CAST SUPPLIES) ×1 IMPLANT
PADDING CAST ABS 4INX4YD NS (CAST SUPPLIES) ×1
PADDING CAST ABS COTTON 4X4 ST (CAST SUPPLIES) ×2 IMPLANT
PADDING CAST COTTON 3X4 STRL (CAST SUPPLIES) ×3
PADDING CAST COTTON 4X4 STRL (CAST SUPPLIES) ×3
PROTECTOR NERVE NEURAWRAP 7MM (Tissue) ×1 IMPLANT
SLEEVE SCD COMPRESS KNEE MED (MISCELLANEOUS) ×2 IMPLANT
SLEEVE SURGEON STRL (DRAPES) ×2 IMPLANT
SPEAR EYE SURG WECK-CEL (MISCELLANEOUS) ×3 IMPLANT
SPLINT PLASTER CAST XFAST 3X15 (CAST SUPPLIES) IMPLANT
SPLINT PLASTER XTRA FASTSET 3X (CAST SUPPLIES)
STOCKINETTE 4X48 STRL (DRAPES) ×3 IMPLANT
SUT ETHIBOND 3-0 V-5 (SUTURE) IMPLANT
SUT ETHILON 4 0 PS 2 18 (SUTURE) ×5 IMPLANT
SUT FIBERWIRE 4-0 18 TAPR NDL (SUTURE)
SUT NYLON 9 0 VRM6 (SUTURE) ×2 IMPLANT
SUT PROLENE 6 0 P 1 18 (SUTURE) IMPLANT
SUT SILK 4 0 PS 2 (SUTURE) IMPLANT
SUT VICRYL 4-0 PS2 18IN ABS (SUTURE) IMPLANT
SUTURE FIBERWR 4-0 18 TAPR NDL (SUTURE) IMPLANT
SYR BULB 3OZ (MISCELLANEOUS) ×3 IMPLANT
SYR CONTROL 10ML LL (SYRINGE) ×3 IMPLANT
TOWEL GREEN STERILE FF (TOWEL DISPOSABLE) ×6 IMPLANT
TRAY DSU PREP LF (CUSTOM PROCEDURE TRAY) IMPLANT
UNDERPAD 30X30 (UNDERPADS AND DIAPERS) ×3 IMPLANT

## 2019-03-08 NOTE — Anesthesia Procedure Notes (Signed)
Anesthesia Regional Block: Supraclavicular block   Pre-Anesthetic Checklist: ,, timeout performed, Correct Patient, Correct Site, Correct Laterality, Correct Procedure, Correct Position, site marked, Risks and benefits discussed,  Surgical consent,  Pre-op evaluation,  At surgeon's request and post-op pain management  Laterality: Right  Prep: chloraprep       Needles:  Injection technique: Single-shot  Needle Type: Echogenic Stimulator Needle     Needle Length: 9cm  Needle Gauge: 21     Additional Needles:   Procedures:,,,, ultrasound used (permanent image in chart),,,,  Narrative:  Start time: 03/08/2019 9:20 AM End time: 03/08/2019 9:27 AM Injection made incrementally with aspirations every 5 mL.  Performed by: Personally  Anesthesiologist: Lidia Collum, MD  Additional Notes: Monitors applied. Injection made in 5cc increments. No resistance to injection. Good needle visualization. Patient tolerated procedure well.

## 2019-03-08 NOTE — Discharge Instructions (Addendum)
1. Medications: usual home medications including any prescribed by Dr. Fredna Dow today 2. Treatment: rest, drink plenty of fluids, follow hand care instructions 3. Follow Up: Please followup with your primary doctor in 1-2 days for discussion of your diagnoses and further evaluation after today's visit; if you do not have a primary care doctor use the resource guide provided to find one; Please return to the ER for new or worsening symptoms including chest pain, SOB, vomiting, syncope or other concerns

## 2019-03-08 NOTE — H&P (Signed)
  Amy Aguilar is an 43 y.o. female.   Chief Complaint: right hand numbness HPI: 43 yo female s/p right carpal tunnel release with continued numbness.  She wishes to proceed with operative nerve release and exploration with repair or fat pad transfer as necessary.  Allergies: No Known Allergies  Past Medical History:  Diagnosis Date  . Abnormal uterine bleeding (AUB)   . Anemia    received blood transfusion 2 wks ago  . Blood transfusion without reported diagnosis   . Headache    migraines  . Leiomyoma of uterus   . Uterine fibroid     Past Surgical History:  Procedure Laterality Date  . ABDOMINAL HYSTERECTOMY    . BILATERAL SALPINGECTOMY Bilateral 12/05/2014   Procedure: BILATERAL SALPINGECTOMY;  Surgeon: Margarette Asal, MD;  Location: [facility];  Service: Gynecology;  Laterality: Bilateral;  . CARPAL TUNNEL RELEASE Right 06/28/2018   Procedure: RIGHT CARPAL TUNNEL RELEASE;  Surgeon: Leandrew Koyanagi, MD;  Location: [place];  Service: Orthopedics;  Laterality: Right;  . LAPAROSCOPIC ASSISTED VAGINAL HYSTERECTOMY N/A 12/05/2014   Procedure: LAPAROSCOPY, ATTEMPTED VAGINAL HYSTERECTOMY, TOTAL ABDOMINAL HYSTERECTOMY;  Surgeon: Margarette Asal, MD;  Location: [place];  Service: Gynecology;  Laterality: N/A;    Family History: Family History  Problem Relation Age of Onset  . Hypertension Father     Social History:   reports that she has never smoked. She has never used smokeless tobacco. She reports that she does not drink alcohol or use drugs.  Medications: Medications Prior to Admission  Medication Sig Dispense Refill  . acetaminophen (TYLENOL) 325 MG tablet Take 650 mg by mouth every 6 (six) hours as needed.      No results found for this or any previous visit (from the past 48 hour(s)).  No results found.   A comprehensive review of systems was negative.  Blood pressure 114/73, pulse 70, temperature  98.7 F (37.1 C), temperature source Tympanic, resp. rate 11, height 5\' 2"  (1.575 m), weight 90.2 kg, last menstrual period 11/21/2014, SpO2 100 %.  General appearance: alert, cooperative and appears stated age Head: Normocephalic, without obvious abnormality, atraumatic Neck: supple, symmetrical, trachea midline Cardio: regular rate and rhythm Resp: clear to auscultation bilaterally Extremities: Intact sensation and capillary refill all digits except right thumb and index finger where she has numbness.  +epl/fpl/io.  No wounds.  Pulses: 2+ and symmetric Skin: Skin color, texture, turgor normal. No rashes or lesions Neurologic: Grossly normal Incision/Wound: none  Assessment/Plan Right hand continued numbness s/p carpal tunnel release.  She wishes to proceed with operative decompression, exploration, and repair or fat pad transfer as necessary.  Non operative and operative treatment options have been discussed with the patient and patient wishes to proceed with operative treatment. Risks, benefits, and alternatives of surgery have been discussed and the patient agrees with the plan of care.   Leanora Cover 03/08/2019, 9:54 AM

## 2019-03-08 NOTE — Anesthesia Preprocedure Evaluation (Addendum)
Anesthesia Evaluation  Patient identified by MRN, date of birth, ID band Patient awake    Reviewed: Allergy & Precautions, NPO status , Patient's Chart, lab work & pertinent test results  History of Anesthesia Complications Negative for: history of anesthetic complications  Airway Mallampati: II  TM Distance: >3 FB Neck ROM: Full    Dental no notable dental hx.    Pulmonary neg pulmonary ROS,    Pulmonary exam normal breath sounds clear to auscultation       Cardiovascular negative cardio ROS Normal cardiovascular exam Rhythm:Regular Rate:Normal     Neuro/Psych negative neurological ROS  negative psych ROS   GI/Hepatic negative GI ROS, Neg liver ROS,   Endo/Other  negative endocrine ROS  Renal/GU negative Renal ROS  negative genitourinary   Musculoskeletal negative musculoskeletal ROS (+)   Abdominal   Peds negative pediatric ROS (+)  Hematology negative hematology ROS (+) anemia ,   Anesthesia Other Findings   Reproductive/Obstetrics negative OB ROS                            Anesthesia Physical Anesthesia Plan  ASA: II  Anesthesia Plan: MAC   Post-op Pain Management:  Regional for Post-op pain   Induction:   PONV Risk Score and Plan: 2 and Propofol infusion and Treatment may vary due to age or medical condition  Airway Management Planned: Simple Face Mask  Additional Equipment: None  Intra-op Plan:   Post-operative Plan:   Informed Consent:   Plan Discussed with:   Anesthesia Plan Comments:         Anesthesia Quick Evaluation

## 2019-03-08 NOTE — Discharge Instructions (Addendum)

## 2019-03-08 NOTE — Op Note (Signed)
I assisted Surgeon(s) and Role:    * Leanora Cover, MD - Primary    Daryll Brod, MD - Assisting on the Procedure(s): RIGHT REPEAT [place], POSSIBLE FAT PAD TRANSFER POSSIBLE NERVE REPAIR OR WRAP on 03/08/2019.  I provided assistance on this case as follows: setup,approach,exploration of the nerve,  neurolysis and scar excision, repair of fascicles, application of the nerve wrap, closure of the incisions and application of the dressings and splint. Electronically signed by: Daryll Brod, MD Date: 03/08/2019 Time: 12:18 PM

## 2019-03-08 NOTE — Op Note (Signed)
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  Repeat right carpal tunnel release 2.  Repair median nerve fascicles 3 cm proximal to wrist crease with neurogen tube wrap   SURGEON:  Leanora Cover, M.D.   ASSISTANT: Daryll Brod, MD   ANESTHESIA:  Regional with sedation   INTRAVENOUS FLUIDS:  Per anesthesia flow sheet.   ESTIMATED BLOOD LOSS:  Minimal.   COMPLICATIONS:  None.   SPECIMENS:  none   TOURNIQUET TIME:    Total Tourniquet Time Documented: Upper Arm (Right) - 105 minutes Total: Upper Arm (Right) - 105 minutes    DISPOSITION:  Stable to PACU.   INDICATIONS: 43 year old female status post right carpal tunnel release with continued numbness in the thumb and index finger and part of the long finger.  She wishes to undergo repeat carpal tunnel release with repair of nerve or fat pad transfer as necessary. Risks, benefits and alternatives of surgery were discussed including the risks of blood loss, infection, damage to nerves, vessels, tendons, ligaments, bone for surgery, need for additional surgery, complications with wound healing, continued pain, continued numbness.  She voiced understanding of these risks and elected to proceed.  OPERATIVE COURSE:  After being identified preoperatively by myself,  the patient and I agreed on the procedure and site of the procedure.  The surgical site was marked.  Surgical consent had been signed. She was given IV antibiotics as preoperative antibiotic prophylaxis. She was transferred to the operating room and placed on the operating table in supine position with the Right Right  upper extremity on an arm board.  Sedation was induced by the anesthesiologist. A regional block had been performed by anesthesia in preoperative holding.   Right upper extremity was prepped and draped in normal sterile orthopedic fashion.  A surgical pause was performed between the surgeons, anesthesia, and operating room staff and all were in agreement as to the patient, procedure, and site of procedure.  Tourniquet at the proximal aspect of the extremity was inflated to 250 mmHg after exsanguination of the arm with an Esmarch bandage.    Incision was made including the previous surgical scar.  This was extended both proximally and distally.  Extended across the wrist crease and a zigzag fashion.  Bipolar electrocautery was used to obtain hemostasis.  There was scar formation underneath the previous surgical incision.  The nerve was identified proximally and carefully traced distally.  The nerve was not adherent to the other soft tissues.  It had good mobility.  There was compression proximal to the previous surgical incision.  The nerve was irritated and hyperemic.  It was flattened as well.  The nerve was decompressed through the previous decompression as well as distally.  The volar antebrachial fascia had been released as well.  The nerve branches going to the long and ring finger webspace, index and long finger webspace, and thumb and radial side of the index finger were all identified and were intact.  The motor branch was identified and was intact.  The palmar cutaneous branch was identified and was intact.  3 cm proximal to the crease the nerve was enlarged.  This was thickened and  scarred.  It had the appearance of neuroma.  This was carefully opened up.  The microscope was brought in and used to aid in micro dissection.  There were noted to be too damaged fascicles at the radial side of the nerve.  Other fascicles appeared intact.  There was significant scarring however making it difficult to identify  fascicles in some locations.  The ends of the damaged fascicles were resected.  They were able to be reapproximated with a 9-0 nylon suture in interrupted fashion.  A 7 mm neurogenic tube wrap was then placed around the repaired area and neuroma.  This was secured to the nerve using a 9-0 nylon suture.  The wound was copiously irrigated with sterile saline.  It was closed with 4-0 nylon in a horizontal mattress fashion.  It was dressed with sterile Xeroform 4 x 4's and wrapped with a Kerlix bandage.  A volar splint was placed and wrapped with Kerlix and Ace bandage.  The tourniquet was deflated at 105 minutes.  Fingertips were pink with brisk capillary refill after deflation of tourniquet.  The operative  drapes were broken down.  The patient was awoken from anesthesia safely.  She was transferred back to the stretcher and taken to PACU in stable condition.  I will see her back in the office in 1 week for postoperative followup.  I will give her a prescription for Norco 5/325 1-2 tabs PO q6 hours prn pain, dispense # 30.   Leanora Cover, MD Electronically signed, 03/08/19

## 2019-03-08 NOTE — ED Triage Notes (Signed)
Pt arrives ambulatory to triage. States she had carpel tunnel surgery at day surgery [facility] this morning. This afternoon, started having a pain in the upper left back and feels short of breath. No cough, no recent travel, no swelling in legs. Took tylenol for pain.

## 2019-03-08 NOTE — Anesthesia Postprocedure Evaluation (Signed)
Anesthesia Post Note  Patient: Nurse, adult  Procedure(s) Performed: EXPLORATION MEDIAN NERVE,  NERVE REPAIR WITH WRAP (Right Wrist)     Patient location during evaluation: PACU Anesthesia Type: MAC and Regional Level of consciousness: awake and alert Pain management: pain level controlled Vital Signs Assessment: post-procedure vital signs reviewed and stable Respiratory status: spontaneous breathing, nonlabored ventilation, respiratory function stable and patient connected to nasal cannula oxygen Cardiovascular status: stable and blood pressure returned to baseline Postop Assessment: no apparent nausea or vomiting Anesthetic complications: no    Last Vitals:  Vitals:   03/08/19 1245 03/08/19 1300  BP: (!) 106/56 (!) 136/58  Pulse: 79 74  Resp: 18 18  Temp:  36.4 C  SpO2: 98% 96%    Last Pain:  Vitals:   03/08/19 1300  TempSrc:   PainSc: 0-No pain                 Montez Hageman

## 2019-03-08 NOTE — ED Notes (Signed)
Pt. ambulated with pulse ox Sats. 100% with good pleth

## 2019-03-08 NOTE — ED Provider Notes (Signed)
[facility] EMERGENCY DEPARTMENT Provider Note   CSN: [id number] Arrival date & time: 03/08/19  1910    History   Chief Complaint Chief Complaint  Patient presents with  . Shortness of Breath    HPI Amy Aguilar is a 43 y.o. female with a hx of anemia, migraine headache presents to the Emergency Department complaining of gradual, improved shortness of breath onset around 5pm.  Pt reports she had carpel tunnel surgery this morning.  Record review shows pt was d/c early afternoon.  Anesthesia note documents no vomiting, aspiration.  Simple face mask was used throughout procedure without intubation or LMA insertion. Pt states she went home after surgery and took a nap.  When she awoke, she felt shortness of breath when lying flat and left back pain that was worse with movement and inspiration.  She denies chest pain or SOB on exertion.  Pt reports her chest felt tight, but she never had pain.  Chest tightness and back pain have resolved at this time. She no longer feels short of breath.  Pt denies left arm pain or jaw pain.  Pt denies previous cardiac hx.  Lying flat initially made the symptoms worse but sitting up made her feel better.  She reports she was directed here after contacting her hand surgeon.  Surgical hx of hysterectomy and R carpel tunnel release (repeat today by Dr. Fredna Dow).  Pt with R supraclavicular block placed by anesthesia.  Neg COVID test on 03/06/2019.  Pt denies fever, chills, headache, neck pain, abd pain, N/V/D, weakness, dizziness, syncope, near syncope.        The history is provided by the patient and medical records. No language interpreter was used.    Past Medical History:  Diagnosis Date  . Abnormal uterine bleeding (AUB)   . Anemia    received blood transfusion 2 wks ago  . Blood transfusion without reported diagnosis   . Headache    migraines  . Leiomyoma of uterus   . Uterine fibroid     Patient Active Problem List   Diagnosis Date Noted  . Migraine with aura and without status migrainosus, not intractable 09/25/2018  . S/P carpal tunnel release 07/12/2018  . Dizziness 02/21/2018  . Headache 02/21/2018  . Varicose veins of bilateral lower extremities with pain 04/13/2017  . Class 1 obesity without serious comorbidity with body mass index (BMI) of 34.0 to 34.9 in adult 04/13/2017  . Carpal tunnel syndrome, left 09/14/2016  . Carpal tunnel syndrome, right upper limb 09/14/2016  . Fibroids 12/05/2014  . Leiomyoma of uterus 11/21/2014  . Anemia 11/21/2014  . Menorrhagia 09/18/2012    Past Surgical History:  Procedure Laterality Date  . ABDOMINAL HYSTERECTOMY    . BILATERAL SALPINGECTOMY Bilateral 12/05/2014   Procedure: BILATERAL SALPINGECTOMY;  Surgeon: Margarette Asal, MD;  Location: [facility];  Service: Gynecology;  Laterality: Bilateral;  . CARPAL TUNNEL RELEASE Right 06/28/2018   Procedure: RIGHT CARPAL TUNNEL RELEASE;  Surgeon: Leandrew Koyanagi, MD;  Location: [place];  Service: Orthopedics;  Laterality: Right;  . LAPAROSCOPIC ASSISTED VAGINAL HYSTERECTOMY N/A 12/05/2014   Procedure: LAPAROSCOPY, ATTEMPTED VAGINAL HYSTERECTOMY, TOTAL ABDOMINAL HYSTERECTOMY;  Surgeon: Margarette Asal, MD;  Location: [place];  Service: Gynecology;  Laterality: N/A;     OB History    Gravida  4   Para  4   Term  4   Preterm  0   AB  0   Living  5     SAB  0   TAB  0   Ectopic  0   Multiple  1   Live Births  5            Home Medications    Prior to Admission medications   Medication Sig Start Date End Date Taking? Authorizing Provider  HYDROcodone-acetaminophen [facility]) 5-325 MG tablet 1-2 tabs po q6 hours prn pain 03/08/19   Leanora Cover, MD    Family History Family History  Problem Relation Age of Onset  . Hypertension Father     Social History Social History   Tobacco Use  . Smoking status: Never Smoker  . Smokeless  tobacco: Never Used  Substance Use Topics  . Alcohol use: No  . Drug use: No     Allergies   Patient has no known allergies.   Review of Systems Review of Systems  Constitutional: Negative for appetite change, diaphoresis, fatigue, fever and unexpected weight change.  HENT: Negative for mouth sores.   Eyes: Negative for visual disturbance.  Respiratory: Positive for chest tightness (resolved) and shortness of breath ( resolved). Negative for cough and wheezing.   Cardiovascular: Negative for chest pain.  Gastrointestinal: Negative for abdominal pain, constipation, diarrhea, nausea and vomiting.  Endocrine: Negative for polydipsia, polyphagia and polyuria.  Genitourinary: Negative for dysuria, frequency, hematuria and urgency.  Musculoskeletal: Positive for back pain ( resolved). Negative for neck stiffness.  Skin: Negative for rash.  Allergic/Immunologic: Negative for immunocompromised state.  Neurological: Negative for syncope, light-headedness and headaches.  Hematological: Does not bruise/bleed easily.  Psychiatric/Behavioral: Negative for sleep disturbance. The patient is not nervous/anxious.      Physical Exam Updated Vital Signs BP 121/79   Pulse 74   Temp 98.4 F (36.9 C) (Oral)   Resp 17   LMP 11/21/2014   SpO2 98%   Physical Exam Vitals signs and nursing note reviewed.  Constitutional:      General: She is not in acute distress.    Appearance: She is well-developed. She is not diaphoretic.     Comments: Awake, alert, nontoxic appearance  HENT:     Head: Normocephalic and atraumatic.     Right Ear: External ear normal.     Left Ear: External ear normal.     Mouth/Throat:     Mouth: Mucous membranes are moist.  Eyes:     General: No scleral icterus.    Conjunctiva/sclera: Conjunctivae normal.  Neck:     Musculoskeletal: Normal range of motion.  Cardiovascular:     Rate and Rhythm: Normal rate and regular rhythm.     Pulses: Normal pulses.           Radial pulses are 2+ on the left side. Right radial pulse not accessible.  Pulmonary:     Effort: Pulmonary effort is normal. No tachypnea, accessory muscle usage, respiratory distress or retractions.     Breath sounds: Normal breath sounds. No stridor. No decreased breath sounds, wheezing, rhonchi or rales.  Chest:     Comments: No crepitus or asymmetrical chest movement Abdominal:     General: Bowel sounds are normal.     Palpations: Abdomen is soft. There is no mass.     Tenderness: There is no abdominal tenderness. There is no guarding or rebound.  Musculoskeletal: Normal range of motion.     Comments: Splint and sling placed on right arm.  Fingers slightly swollen.  Capillary refill normal in the right hand. No midline tenderness to the  t-spine or l-spine.  No ecchymosis or abrasion noted.  No TTP of the paraspinal muscles.  Full ROM of the left shoulder without pain.  Skin:    General: Skin is warm and dry.  Neurological:     Mental Status: She is alert.     Comments: Speech is clear and goal oriented Moves extremities without ataxia      ED Treatments / Results  Labs (all labs ordered are listed, but only abnormal results are displayed) Labs Reviewed  BASIC METABOLIC PANEL - Abnormal; Notable for the following components:      Result Value   Glucose, Bld 118 (*)    All other components within normal limits  CBC - Abnormal; Notable for the following components:   WBC 11.5 (*)    All other components within normal limits    EKG EKG Interpretation  Date/Time:  Thursday Mar 08 2019 19:23:36 EDT Ventricular Rate:  89 PR Interval:  136 QRS Duration: 78 QT Interval:  376 QTC Calculation: 457 R Axis:   91 Text Interpretation:  Normal sinus rhythm Rightward axis Borderline ECG No significant change since last tracing Confirmed by Wandra Arthurs [phone]) on 03/08/2019 9:42:50 PM   Radiology Dg Chest 2 View  Result Date: 03/08/2019 CLINICAL DATA:  Shortness of breath.  Patient reports having right wrist surgery earlier today. EXAM: CHEST - 2 VIEW COMPARISON:  Radiograph 07/07/2017 FINDINGS: The cardiomediastinal contours are normal. The lungs are clear. Pulmonary vasculature is normal. No consolidation, pleural effusion, or pneumothorax. No acute osseous abnormalities are seen. IMPRESSION: No acute pulmonary process. Electronically Signed   By: Keith Rake M.D.   On: 03/08/2019 20:21    Procedures Procedures (including critical care time)  Medications Ordered in ED Medications - No data to display   Initial Impression / Assessment and Plan / ED Course  I have reviewed the triage vital signs and the nursing notes.  Pertinent labs & imaging results that were available during my care of the patient were reviewed by me and considered in my medical decision making (see chart for details).  Clinical Course as of Mar 08 34  Thu Mar 08, 2019  2220 WNL  Hemoglobin: 12.9 [HM]  2220 Mild leukocytosis expected after surgery  WBC(!): 11.5 [HM]  2221 No evidence of pneumothorax, pulmonary edema or pneumonia.  I personally evaluated the images.   DG Chest 2 View [HM]  2221 No tachycardia   Pulse Rate: 74 [HM]  2221 No hypoxia  SpO2: 98 % [HM]  2223 No STEMI or acute ischemia.  NSR.  EKG 12-Lead [HM]  2253 Pt discussed with Dr. Darl Householder who agrees that PE is unlikely and does not recommend CT chest; comfortable with d/c home as pt remains stable and symptoms have largely resolved.   [HM]    Clinical Course User Index [HM] Lalana Wachter, Jarrett Soho, PA-C        Pt presents with SOB and left sided back pain after surgery, now with resolution of symptoms.  Pt without tachycardia, tachypnea or hypoxia.  Low likelihood of PE as surgery was this morning and pt has not had periods of immobilization prior to surgery.  Pt is without other risk factors for PE.  Pt's pain was left sided back pain.  No asymmetric movement of the chest.  No clinical indication of nerve  block complication.  Chest x-ray is without evidence of pneumothorax, pulmonary edema or pneumonia.  Less likely to be secondary to aspiration.  Pt is alert and  oriented.  She is without chest pain and EKG is without ischemia.  No evidence of acute coronary syndrome.  11:12 PM Pt ambulated in the ED without hypoxia, SOB or respiratory distress.  She remains well appearing without chest pain.  Patient reports she remains symptom-free.  She states no difficulty with ambulation and is comfortable with discharge home.  Patient given strict return precautions including return immediately to the emergency department for new or worsening symptoms, development of chest pain, worsening shortness of breath or other concerns.  She is to have primary care follow-up in 1-2 days for assessment.  Patient states understanding and is in agreement with the plan.  Final Clinical Impressions(s) / ED Diagnoses   Final diagnoses:  Mild shortness of breath  Acute left-sided thoracic back pain    ED Discharge Orders    None       Adaira Centola, Gwenlyn Perking 03/09/19 0036    Drenda Freeze, MD 03/09/19 [phone]

## 2019-03-08 NOTE — Transfer of Care (Signed)
Immediate Anesthesia Transfer of Care Note  Patient: Amy Aguilar  Procedure(s) Performed: RIGHT REPEAT CARPAL TUNNEL RELEASE (Right Wrist) EXPLORATION MEDIAN NERVE, POSSIBLE FAT PAD TRANSFER POSSIBLE NERVE REPAIR OR WRAP (Right Wrist)  Patient Location: PACU  Anesthesia Type:MAC combined with regional for post-op pain  Level of Consciousness: awake, sedated and patient cooperative  Airway & Oxygen Therapy: Patient Spontanous Breathing and Patient connected to face mask oxygen  Post-op Assessment: Report given to RN and Post -op Vital signs reviewed and stable  Post vital signs: Reviewed and stable  Last Vitals:  Vitals Value Taken Time  BP    Temp    Pulse    Resp 19 03/08/2019 12:13 PM  SpO2    Vitals shown include unvalidated device data.  Last Pain:  Vitals:   03/08/19 0907  TempSrc: Tympanic  PainSc: 0-No pain         Complications: No apparent anesthesia complications

## 2019-03-08 NOTE — Progress Notes (Signed)
Assisted Dr. Christella Hartigan with right, ultrasound guided, axillary block. Side rails up, monitors on throughout procedure. See vital signs in flow sheet. Tolerated Procedure well.

## 2019-03-09 ENCOUNTER — Encounter (HOSPITAL_BASED_OUTPATIENT_CLINIC_OR_DEPARTMENT_OTHER): Payer: Self-pay | Admitting: Orthopedic Surgery

## 2019-03-16 DIAGNOSIS — G5601 Carpal tunnel syndrome, right upper limb: Secondary | ICD-10-CM | POA: Diagnosis not present

## 2019-03-16 DIAGNOSIS — M25431 Effusion, right wrist: Secondary | ICD-10-CM | POA: Diagnosis not present

## 2019-03-16 DIAGNOSIS — S5411XD Injury of median nerve at forearm level, right arm, subsequent encounter: Secondary | ICD-10-CM | POA: Diagnosis not present

## 2019-03-16 DIAGNOSIS — G5611 Other lesions of median nerve, right upper limb: Secondary | ICD-10-CM | POA: Diagnosis not present

## 2019-03-16 DIAGNOSIS — M25641 Stiffness of right hand, not elsewhere classified: Secondary | ICD-10-CM | POA: Diagnosis not present

## 2019-03-16 DIAGNOSIS — S5411XA Injury of median nerve at forearm level, right arm, initial encounter: Secondary | ICD-10-CM | POA: Insufficient documentation

## 2019-03-16 DIAGNOSIS — M25531 Pain in right wrist: Secondary | ICD-10-CM | POA: Diagnosis not present

## 2019-04-11 DIAGNOSIS — S5411XD Injury of median nerve at forearm level, right arm, subsequent encounter: Secondary | ICD-10-CM | POA: Diagnosis not present

## 2019-04-11 DIAGNOSIS — G5611 Other lesions of median nerve, right upper limb: Secondary | ICD-10-CM | POA: Diagnosis not present

## 2019-04-11 DIAGNOSIS — G5601 Carpal tunnel syndrome, right upper limb: Secondary | ICD-10-CM | POA: Diagnosis not present

## 2019-04-11 DIAGNOSIS — M25531 Pain in right wrist: Secondary | ICD-10-CM | POA: Diagnosis not present

## 2019-04-11 DIAGNOSIS — M25431 Effusion, right wrist: Secondary | ICD-10-CM | POA: Diagnosis not present

## 2019-04-11 DIAGNOSIS — M25641 Stiffness of right hand, not elsewhere classified: Secondary | ICD-10-CM | POA: Diagnosis not present

## 2019-04-20 DIAGNOSIS — M25531 Pain in right wrist: Secondary | ICD-10-CM | POA: Diagnosis not present

## 2019-04-20 DIAGNOSIS — M25641 Stiffness of right hand, not elsewhere classified: Secondary | ICD-10-CM | POA: Diagnosis not present

## 2019-04-20 DIAGNOSIS — G5611 Other lesions of median nerve, right upper limb: Secondary | ICD-10-CM | POA: Diagnosis not present

## 2019-04-20 DIAGNOSIS — G5601 Carpal tunnel syndrome, right upper limb: Secondary | ICD-10-CM | POA: Diagnosis not present

## 2019-04-20 DIAGNOSIS — M25431 Effusion, right wrist: Secondary | ICD-10-CM | POA: Diagnosis not present

## 2019-04-20 DIAGNOSIS — S5411XD Injury of median nerve at forearm level, right arm, subsequent encounter: Secondary | ICD-10-CM | POA: Diagnosis not present

## 2019-04-25 DIAGNOSIS — M25431 Effusion, right wrist: Secondary | ICD-10-CM | POA: Diagnosis not present

## 2019-04-25 DIAGNOSIS — S5411XD Injury of median nerve at forearm level, right arm, subsequent encounter: Secondary | ICD-10-CM | POA: Diagnosis not present

## 2019-04-25 DIAGNOSIS — G5611 Other lesions of median nerve, right upper limb: Secondary | ICD-10-CM | POA: Diagnosis not present

## 2019-04-25 DIAGNOSIS — M25531 Pain in right wrist: Secondary | ICD-10-CM | POA: Diagnosis not present

## 2019-04-25 DIAGNOSIS — G5601 Carpal tunnel syndrome, right upper limb: Secondary | ICD-10-CM | POA: Diagnosis not present

## 2019-04-25 DIAGNOSIS — M25641 Stiffness of right hand, not elsewhere classified: Secondary | ICD-10-CM | POA: Diagnosis not present

## 2019-06-15 ENCOUNTER — Telehealth: Payer: Self-pay | Admitting: Family Medicine

## 2019-06-15 NOTE — Telephone Encounter (Signed)
Spoke to patient and patient's daughter who assisted with translation. Pt c/o high fever ( unknown temp. But really hot to touch) chills, muscle and body aches.  Has not taken Ibuprofen but states Tylenol  does not help. 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   Patient was advised to go to Urgent Care for evaluation for laboratory testing and r/o COVID.   Pt verbalized understanding.

## 2019-06-15 NOTE — Telephone Encounter (Signed)
Patient Daughter called on the behalf of her mother states she has been experiencing chills,fever,weakness,loss of appetite, nausea, headaches and states eyes have a burning sensation and body pain.   Daughter states she has been taking paracetamol and it helped a little but the symptoms are still ongoing.   Patient was recently out of the country to [place] and believes she was exposed to dengue because there was an outbreak where she was staying.  Please follow up    Advised to go to the ED or UC if needing to get seen immediately

## 2019-07-09 DIAGNOSIS — S5411XD Injury of median nerve at forearm level, right arm, subsequent encounter: Secondary | ICD-10-CM | POA: Diagnosis not present

## 2019-07-12 DIAGNOSIS — S5411XD Injury of median nerve at forearm level, right arm, subsequent encounter: Secondary | ICD-10-CM | POA: Diagnosis not present

## 2019-07-12 DIAGNOSIS — G5611 Other lesions of median nerve, right upper limb: Secondary | ICD-10-CM | POA: Diagnosis not present

## 2019-07-12 DIAGNOSIS — R29898 Other symptoms and signs involving the musculoskeletal system: Secondary | ICD-10-CM | POA: Diagnosis not present

## 2019-07-12 DIAGNOSIS — M25641 Stiffness of right hand, not elsewhere classified: Secondary | ICD-10-CM | POA: Diagnosis not present

## 2019-07-12 DIAGNOSIS — G5601 Carpal tunnel syndrome, right upper limb: Secondary | ICD-10-CM | POA: Diagnosis not present

## 2019-07-19 DIAGNOSIS — S5411XD Injury of median nerve at forearm level, right arm, subsequent encounter: Secondary | ICD-10-CM | POA: Diagnosis not present

## 2019-07-19 DIAGNOSIS — G5611 Other lesions of median nerve, right upper limb: Secondary | ICD-10-CM | POA: Diagnosis not present

## 2019-07-19 DIAGNOSIS — R29898 Other symptoms and signs involving the musculoskeletal system: Secondary | ICD-10-CM | POA: Diagnosis not present

## 2019-07-19 DIAGNOSIS — G5601 Carpal tunnel syndrome, right upper limb: Secondary | ICD-10-CM | POA: Diagnosis not present

## 2019-07-25 DIAGNOSIS — R29898 Other symptoms and signs involving the musculoskeletal system: Secondary | ICD-10-CM | POA: Diagnosis not present

## 2019-07-25 DIAGNOSIS — G5601 Carpal tunnel syndrome, right upper limb: Secondary | ICD-10-CM | POA: Diagnosis not present

## 2019-07-25 DIAGNOSIS — G5611 Other lesions of median nerve, right upper limb: Secondary | ICD-10-CM | POA: Diagnosis not present

## 2019-07-25 DIAGNOSIS — S5411XD Injury of median nerve at forearm level, right arm, subsequent encounter: Secondary | ICD-10-CM | POA: Diagnosis not present

## 2019-08-02 DIAGNOSIS — R29898 Other symptoms and signs involving the musculoskeletal system: Secondary | ICD-10-CM | POA: Diagnosis not present

## 2019-08-02 DIAGNOSIS — G5611 Other lesions of median nerve, right upper limb: Secondary | ICD-10-CM | POA: Diagnosis not present

## 2019-08-02 DIAGNOSIS — M25531 Pain in right wrist: Secondary | ICD-10-CM | POA: Diagnosis not present

## 2019-08-02 DIAGNOSIS — G5601 Carpal tunnel syndrome, right upper limb: Secondary | ICD-10-CM | POA: Diagnosis not present

## 2019-08-02 DIAGNOSIS — S5411XD Injury of median nerve at forearm level, right arm, subsequent encounter: Secondary | ICD-10-CM | POA: Diagnosis not present

## 2019-08-02 DIAGNOSIS — M25431 Effusion, right wrist: Secondary | ICD-10-CM | POA: Diagnosis not present

## 2019-08-02 DIAGNOSIS — M25641 Stiffness of right hand, not elsewhere classified: Secondary | ICD-10-CM | POA: Diagnosis not present

## 2019-08-15 ENCOUNTER — Other Ambulatory Visit (HOSPITAL_COMMUNITY)
Admission: RE | Admit: 2019-08-15 | Discharge: 2019-08-15 | Disposition: A | Discharge status: Home / Self Care | Payer: Medicaid Other | Source: Ambulatory Visit | Attending: Family Medicine | Admitting: Family Medicine

## 2019-08-15 ENCOUNTER — Ambulatory Visit: Payer: Medicaid Other | Attending: Family Medicine | Admitting: Physician Assistant

## 2019-08-15 ENCOUNTER — Other Ambulatory Visit: Payer: Self-pay

## 2019-08-15 ENCOUNTER — Ambulatory Visit: Payer: Medicaid Other

## 2019-08-15 DIAGNOSIS — R8290 Unspecified abnormal findings in urine: Secondary | ICD-10-CM | POA: Insufficient documentation

## 2019-08-15 DIAGNOSIS — Z789 Other specified health status: Secondary | ICD-10-CM | POA: Diagnosis not present

## 2019-08-15 DIAGNOSIS — R21 Rash and other nonspecific skin eruption: Secondary | ICD-10-CM | POA: Diagnosis not present

## 2019-08-15 LAB — POCT URINALYSIS DIP (CLINITEK)
Bilirubin, UA: NEGATIVE
Glucose, UA: NEGATIVE mg/dL
Ketones, POC UA: NEGATIVE mg/dL
Nitrite, UA: POSITIVE — AB
POC PROTEIN,UA: NEGATIVE
Spec Grav, UA: 1.025 (ref 1.010–1.025)
Urobilinogen, UA: 0.2 E.U./dL
pH, UA: 5.5 (ref 5.0–8.0)

## 2019-08-15 MED ORDER — CETIRIZINE HCL 10 MG PO TABS: 10.0000 mg | ORAL_TABLET | Freq: Every day | ORAL | 11 refills | Status: DC

## 2019-08-15 MED ORDER — TRIAMCINOLONE ACETONIDE 0.1 % EX CREA: 1.0000 "application " | TOPICAL_CREAM | Freq: Two times a day (BID) | CUTANEOUS | 0 refills | Status: DC

## 2019-08-15 MED ORDER — SULFAMETHOXAZOLE-TRIMETHOPRIM 800-160 MG PO TABS: 1.0000 | ORAL_TABLET | Freq: Two times a day (BID) | ORAL | 0 refills | Status: DC

## 2019-08-15 MED FILL — SULFAMETHOXAZOLE-TMP DS TAB: 800-160 | 5 days supply | Qty: 10 | Fill #0

## 2019-08-15 MED FILL — TRIAMCINOLONE ACETONIDE 0.1: 0.1 | 15 days supply | Qty: 30 | Fill #0

## 2019-08-15 NOTE — Progress Notes (Signed)
Virtual Visit via Telephone Note  I connected with Amy Aguilar on 08/15/19 at  9:30 AM EDT by telephone and verified that I am speaking with the correct person using two identifiers.   I discussed the limitations, risks, security and privacy concerns of performing an evaluation and management service by telephone and the availability of in person appointments. I also discussed with the patient that there may be a patient responsible charge related to this service. The patient expressed understanding and agreed to proceed.  Patient location:  Home  My Location:  home office Persons on the call:  Me, the interpreter(Feebie), and the patient.     History of Present Illness:  Dysuria and odor in urine for 2-3 days.  Also having frequency of urination.  Denies vaginal discharge.  No pelvic or abdominal pain.  No fever.  No N/V. No STI risk factors.  No back/flank pain  About 1 week ago started having a rash on her hip.  Then started having rashes all over her arms, trunks.  Gets red when she scratches it.  Denies contact with PI/PO.      Observations/Objective:  NAD.  A&Ox3   Assessment and Plan: 1. Rash-suspicious for pityriasis rosea - cetirizine (ZYRTEC) 10 MG tablet; Take 1 tablet (10 mg total) by mouth daily.  Dispense: 30 tablet; Refill: 11 - triamcinolone cream (KENALOG) 0.1 %; Apply 1 application topically 2 (two) times daily.  Dispense: 30 g; Refill: 0  2. Abnormal urine odor Increase water intake,  - POCT URINALYSIS DIP (CLINITEK) - Urine Culture -septra ds 1 bid X 5 days  3. Language barrier pacific interpreters used and additional time performing visit was required.  Follow Up Instructions: prn   I discussed the assessment and treatment plan with the patient. The patient was provided an opportunity to ask questions and all were answered. The patient agreed with the plan and demonstrated an understanding of the instructions.   The patient was advised to call  back or seek an in-person evaluation if the symptoms worsen or if the condition fails to improve as anticipated.  I provided 12 minutes of non-face-to-face time during this encounter.   Freeman Caldron, PA-C  Patient ID: Amy Aguilar, female   DOB: 04/26/1976, 43 y.o.   MRN: [id number]

## 2019-08-15 NOTE — Progress Notes (Signed)
Sore under arms, legs and bottom for about 1 wk. The cream that she uses is not working anymore (benadryl and cortisone cream)   Frequency in Urine and it smells

## 2019-08-17 LAB — URINE CULTURE

## 2019-08-20 ENCOUNTER — Telehealth: Payer: Self-pay | Admitting: Family Medicine

## 2019-08-20 ENCOUNTER — Other Ambulatory Visit: Payer: Self-pay | Admitting: Family Medicine

## 2019-08-20 DIAGNOSIS — N3000 Acute cystitis without hematuria: Secondary | ICD-10-CM

## 2019-08-20 LAB — URINE CYTOLOGY ANCILLARY ONLY
Bacterial Vaginitis-Urine: NEGATIVE
Candida Urine: NEGATIVE
Chlamydia: NEGATIVE
Comment: NEGATIVE
Comment: NORMAL
Neisseria Gonorrhea: NEGATIVE
Trichomonas: POSITIVE — AB

## 2019-08-20 MED ORDER — FLUCONAZOLE 100 MG PO TABS: 100.0000 mg | ORAL_TABLET | Freq: Every day | ORAL | 0 refills | Status: DC

## 2019-08-20 MED ORDER — CEPHALEXIN 500 MG PO CAPS: 500.0000 mg | ORAL_CAPSULE | Freq: Two times a day (BID) | ORAL | 0 refills | Status: DC

## 2019-08-20 NOTE — Progress Notes (Signed)
Patient ID: Amy Aguilar, female   DOB: 1976/07/28, 43 y.o.   MRN: [id number]   Patient left call regarding continued symptoms after treatment with Septra DS for UTI and patient with complaint of no improvement in rash- she was recently seen for a telemedicine visit by another provider. A new RX will be sent in for keflex for UTI and diflucan for UTI but she may need office visit so that someone can visualize her rash

## 2019-08-20 NOTE — Telephone Encounter (Signed)
Patient called wanting to know if she could be prescribed another medication because her   sulfamethoxazole-trimethoprim (BACTRIM DS) 800-160 MG tablet   triamcinolone cream (KENALOG) 0.1 %    Are not working for her and she is still having a lot of itching. Please follow up.

## 2019-08-20 NOTE — Telephone Encounter (Signed)
Let patient know that I reviewed her urine culture and I can send in a prescription for a different antibiotic as well as diflucan in case of a yeast infection. She saw [facility] for the rash so I am not sure what her rash looks like so she may need an office visit if rash is not getting better

## 2019-08-22 ENCOUNTER — Other Ambulatory Visit: Payer: Self-pay | Admitting: Physician Assistant

## 2019-08-22 DIAGNOSIS — R21 Rash and other nonspecific skin eruption: Secondary | ICD-10-CM

## 2019-08-22 MED ORDER — METRONIDAZOLE 500 MG PO TABS: 500.0000 mg | ORAL_TABLET | Freq: Two times a day (BID) | ORAL | 0 refills | Status: DC

## 2019-08-22 MED ORDER — TRIAMCINOLONE ACETONIDE 0.1 % EX CREA: 1.0000 "application " | TOPICAL_CREAM | Freq: Two times a day (BID) | CUTANEOUS | 3 refills | Status: DC

## 2019-08-22 MED ORDER — FLUTICASONE PROPIONATE 50 MCG/ACT NA SUSP: 2.0000 | Freq: Every day | NASAL | 6 refills | Status: DC

## 2019-08-22 MED ORDER — LORATADINE 10 MG PO TABS: 10.0000 mg | ORAL_TABLET | Freq: Every day | ORAL | 11 refills | Status: DC

## 2019-08-22 MED FILL — FLUTICASONE PROP 50 MCG SPR: 50 | 30 days supply | Qty: 16 | Fill #0

## 2019-08-22 MED FILL — metroNIDAZOLE 500 MG TABS: 500 | 7 days supply | Qty: 14 | Fill #0

## 2019-08-22 MED FILL — TRIAMCINOLONE ACETONIDE 0.1: 0.1 | 15 days supply | Qty: 30 | Fill #0

## 2019-08-22 NOTE — Telephone Encounter (Signed)
Allergies med not working and need more refills for cream

## 2019-08-22 NOTE — Telephone Encounter (Signed)
New Rx sent.

## 2019-08-22 NOTE — Telephone Encounter (Signed)
Patient was informed.

## 2019-09-04 DIAGNOSIS — G5603 Carpal tunnel syndrome, bilateral upper limbs: Secondary | ICD-10-CM | POA: Diagnosis not present

## 2019-09-04 DIAGNOSIS — S5411XD Injury of median nerve at forearm level, right arm, subsequent encounter: Secondary | ICD-10-CM | POA: Diagnosis not present

## 2019-09-24 ENCOUNTER — Other Ambulatory Visit: Payer: Self-pay

## 2019-09-24 ENCOUNTER — Ambulatory Visit
Admission: RE | Admit: 2019-09-24 | Discharge: 2019-09-24 | Disposition: A | Discharge status: Home / Self Care | Payer: Medicaid Other | Source: Ambulatory Visit | Attending: Family Medicine | Admitting: Family Medicine

## 2019-09-24 ENCOUNTER — Other Ambulatory Visit: Payer: Self-pay | Admitting: Family Medicine

## 2019-09-24 DIAGNOSIS — Z1231 Encounter for screening mammogram for malignant neoplasm of breast: Secondary | ICD-10-CM

## 2019-09-26 DIAGNOSIS — G5603 Carpal tunnel syndrome, bilateral upper limbs: Secondary | ICD-10-CM | POA: Diagnosis not present

## 2019-09-26 DIAGNOSIS — S5411XD Injury of median nerve at forearm level, right arm, subsequent encounter: Secondary | ICD-10-CM | POA: Diagnosis not present

## 2019-09-26 IMAGING — US ULTRASOUND LEFT BREAST LIMITED
1 series · 1 of 1 positions shown · non-contrast
Comparison: Previous exam(s).

CLINICAL DATA: Improving pain in the lateral left breast.

EXAM:
DIGITAL DIAGNOSTIC BILATERAL MAMMOGRAM WITH CAD AND TOMO
ULTRASOUND BILATERAL BREAST

[Series 1: ultrasound left breast limited · 0.07mm/px · 1 of 1 slices shown]
[im 1/1]
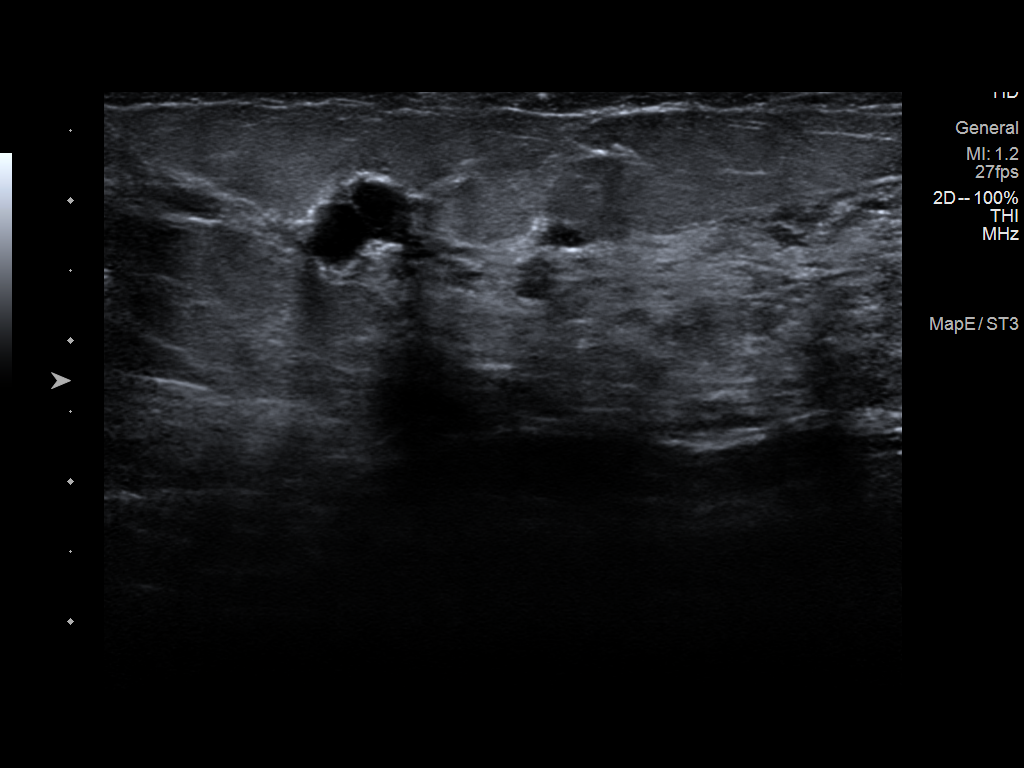

[1 of 1 positions shown; findings below may reference images not displayed]

ACR Breast Density Category c: The breast tissue is heterogeneously
dense, which may obscure small masses.
FINDINGS: No suspicious masses on the left. Multiple obscured masses in the
upper outer right breast. No evidence of malignancy in either
breast.

Mammographic images were processed with CAD.

On physical exam, no suspicious lumps are identified.

Targeted ultrasound is performed, showing multiple cysts in both
breast accounting for mammographic findings. No suspicious findings
in either breast.
IMPRESSION: Fibrocystic changes.  No evidence of malignancy.

RECOMMENDATION:
Annual screening mammography.

I have discussed the findings and recommendations with the patient.
Results were also provided in writing at the conclusion of the
visit. If applicable, a reminder letter will be sent to the patient
regarding the next appointment.

BI-RADS CATEGORY  2: Benign.

## 2019-09-28 ENCOUNTER — Telehealth: Payer: Self-pay | Admitting: *Deleted

## 2019-09-28 NOTE — Telephone Encounter (Signed)
-----   Message from Antony Blackbird, MD sent at 09/24/2019  5:47 PM EST ----- Normal mammogram; 1 year repeat recommended by radiology

## 2019-09-28 NOTE — Telephone Encounter (Signed)
Patient verified DOB Patient is aware of mammogram being normal and complete another one in one year.

## 2019-10-03 DIAGNOSIS — G5603 Carpal tunnel syndrome, bilateral upper limbs: Secondary | ICD-10-CM | POA: Diagnosis not present

## 2019-10-03 DIAGNOSIS — S5411XD Injury of median nerve at forearm level, right arm, subsequent encounter: Secondary | ICD-10-CM | POA: Diagnosis not present

## 2019-12-13 DIAGNOSIS — Z9189 Other specified personal risk factors, not elsewhere classified: Secondary | ICD-10-CM | POA: Diagnosis not present

## 2019-12-13 DIAGNOSIS — Z20822 Contact with and (suspected) exposure to covid-19: Secondary | ICD-10-CM | POA: Diagnosis not present

## 2019-12-20 DIAGNOSIS — S5411XD Injury of median nerve at forearm level, right arm, subsequent encounter: Secondary | ICD-10-CM | POA: Diagnosis not present

## 2019-12-20 DIAGNOSIS — S5411XA Injury of median nerve at forearm level, right arm, initial encounter: Secondary | ICD-10-CM | POA: Diagnosis not present

## 2019-12-20 DIAGNOSIS — G5603 Carpal tunnel syndrome, bilateral upper limbs: Secondary | ICD-10-CM | POA: Diagnosis not present

## 2019-12-20 DIAGNOSIS — G8918 Other acute postprocedural pain: Secondary | ICD-10-CM | POA: Diagnosis not present

## 2020-01-03 DIAGNOSIS — S5411XD Injury of median nerve at forearm level, right arm, subsequent encounter: Secondary | ICD-10-CM | POA: Diagnosis not present

## 2020-01-09 DIAGNOSIS — M79644 Pain in right finger(s): Secondary | ICD-10-CM | POA: Diagnosis not present

## 2020-01-09 DIAGNOSIS — Z4789 Encounter for other orthopedic aftercare: Secondary | ICD-10-CM | POA: Diagnosis not present

## 2020-01-09 DIAGNOSIS — G5601 Carpal tunnel syndrome, right upper limb: Secondary | ICD-10-CM | POA: Diagnosis not present

## 2020-01-09 DIAGNOSIS — M25531 Pain in right wrist: Secondary | ICD-10-CM | POA: Diagnosis not present

## 2020-01-09 DIAGNOSIS — M25631 Stiffness of right wrist, not elsewhere classified: Secondary | ICD-10-CM | POA: Diagnosis not present

## 2020-01-09 DIAGNOSIS — M25641 Stiffness of right hand, not elsewhere classified: Secondary | ICD-10-CM | POA: Diagnosis not present

## 2020-01-16 DIAGNOSIS — M79644 Pain in right finger(s): Secondary | ICD-10-CM | POA: Diagnosis not present

## 2020-01-16 DIAGNOSIS — Z4789 Encounter for other orthopedic aftercare: Secondary | ICD-10-CM | POA: Diagnosis not present

## 2020-01-16 DIAGNOSIS — R52 Pain, unspecified: Secondary | ICD-10-CM | POA: Diagnosis not present

## 2020-01-16 DIAGNOSIS — M25531 Pain in right wrist: Secondary | ICD-10-CM | POA: Diagnosis not present

## 2020-01-16 DIAGNOSIS — M25641 Stiffness of right hand, not elsewhere classified: Secondary | ICD-10-CM | POA: Diagnosis not present

## 2020-01-16 DIAGNOSIS — M25631 Stiffness of right wrist, not elsewhere classified: Secondary | ICD-10-CM | POA: Diagnosis not present

## 2020-01-16 DIAGNOSIS — S5411XD Injury of median nerve at forearm level, right arm, subsequent encounter: Secondary | ICD-10-CM | POA: Diagnosis not present

## 2020-01-16 DIAGNOSIS — G5601 Carpal tunnel syndrome, right upper limb: Secondary | ICD-10-CM | POA: Diagnosis not present

## 2020-01-30 DIAGNOSIS — G5601 Carpal tunnel syndrome, right upper limb: Secondary | ICD-10-CM | POA: Diagnosis not present

## 2020-01-30 DIAGNOSIS — S5411XD Injury of median nerve at forearm level, right arm, subsequent encounter: Secondary | ICD-10-CM | POA: Diagnosis not present

## 2020-01-31 DIAGNOSIS — S5411XD Injury of median nerve at forearm level, right arm, subsequent encounter: Secondary | ICD-10-CM | POA: Diagnosis not present

## 2020-01-31 DIAGNOSIS — M79644 Pain in right finger(s): Secondary | ICD-10-CM | POA: Diagnosis not present

## 2020-01-31 DIAGNOSIS — M25641 Stiffness of right hand, not elsewhere classified: Secondary | ICD-10-CM | POA: Diagnosis not present

## 2020-01-31 DIAGNOSIS — G5601 Carpal tunnel syndrome, right upper limb: Secondary | ICD-10-CM | POA: Diagnosis not present

## 2020-01-31 DIAGNOSIS — G5611 Other lesions of median nerve, right upper limb: Secondary | ICD-10-CM | POA: Diagnosis not present

## 2020-01-31 DIAGNOSIS — R29898 Other symptoms and signs involving the musculoskeletal system: Secondary | ICD-10-CM | POA: Diagnosis not present

## 2020-01-31 DIAGNOSIS — M25631 Stiffness of right wrist, not elsewhere classified: Secondary | ICD-10-CM | POA: Diagnosis not present

## 2020-02-06 DIAGNOSIS — R52 Pain, unspecified: Secondary | ICD-10-CM | POA: Diagnosis not present

## 2020-02-06 DIAGNOSIS — S5411XD Injury of median nerve at forearm level, right arm, subsequent encounter: Secondary | ICD-10-CM | POA: Diagnosis not present

## 2020-02-06 DIAGNOSIS — M25631 Stiffness of right wrist, not elsewhere classified: Secondary | ICD-10-CM | POA: Diagnosis not present

## 2020-02-06 DIAGNOSIS — M25641 Stiffness of right hand, not elsewhere classified: Secondary | ICD-10-CM | POA: Diagnosis not present

## 2020-02-06 DIAGNOSIS — G5601 Carpal tunnel syndrome, right upper limb: Secondary | ICD-10-CM | POA: Diagnosis not present

## 2020-02-06 DIAGNOSIS — R29898 Other symptoms and signs involving the musculoskeletal system: Secondary | ICD-10-CM | POA: Diagnosis not present

## 2020-02-06 DIAGNOSIS — G5611 Other lesions of median nerve, right upper limb: Secondary | ICD-10-CM | POA: Diagnosis not present

## 2020-02-06 DIAGNOSIS — M79644 Pain in right finger(s): Secondary | ICD-10-CM | POA: Diagnosis not present

## 2020-02-07 ENCOUNTER — Ambulatory Visit: Payer: Medicaid Other | Admitting: Family Medicine

## 2020-02-26 DIAGNOSIS — G5611 Other lesions of median nerve, right upper limb: Secondary | ICD-10-CM | POA: Diagnosis not present

## 2020-02-26 DIAGNOSIS — R29898 Other symptoms and signs involving the musculoskeletal system: Secondary | ICD-10-CM | POA: Diagnosis not present

## 2020-02-26 DIAGNOSIS — M25531 Pain in right wrist: Secondary | ICD-10-CM | POA: Diagnosis not present

## 2020-02-26 DIAGNOSIS — S5411XD Injury of median nerve at forearm level, right arm, subsequent encounter: Secondary | ICD-10-CM | POA: Diagnosis not present

## 2020-02-26 DIAGNOSIS — M79644 Pain in right finger(s): Secondary | ICD-10-CM | POA: Diagnosis not present

## 2020-02-26 DIAGNOSIS — M25641 Stiffness of right hand, not elsewhere classified: Secondary | ICD-10-CM | POA: Diagnosis not present

## 2020-02-26 DIAGNOSIS — M25631 Stiffness of right wrist, not elsewhere classified: Secondary | ICD-10-CM | POA: Diagnosis not present

## 2020-02-26 DIAGNOSIS — G5601 Carpal tunnel syndrome, right upper limb: Secondary | ICD-10-CM | POA: Diagnosis not present

## 2020-03-04 DIAGNOSIS — G5611 Other lesions of median nerve, right upper limb: Secondary | ICD-10-CM | POA: Diagnosis not present

## 2020-03-04 DIAGNOSIS — R29898 Other symptoms and signs involving the musculoskeletal system: Secondary | ICD-10-CM | POA: Diagnosis not present

## 2020-03-04 DIAGNOSIS — M25641 Stiffness of right hand, not elsewhere classified: Secondary | ICD-10-CM | POA: Diagnosis not present

## 2020-03-04 DIAGNOSIS — S5411XD Injury of median nerve at forearm level, right arm, subsequent encounter: Secondary | ICD-10-CM | POA: Diagnosis not present

## 2020-03-04 DIAGNOSIS — G5601 Carpal tunnel syndrome, right upper limb: Secondary | ICD-10-CM | POA: Diagnosis not present

## 2020-03-04 DIAGNOSIS — M25531 Pain in right wrist: Secondary | ICD-10-CM | POA: Diagnosis not present

## 2020-03-04 DIAGNOSIS — M25631 Stiffness of right wrist, not elsewhere classified: Secondary | ICD-10-CM | POA: Diagnosis not present

## 2020-03-14 DIAGNOSIS — M25631 Stiffness of right wrist, not elsewhere classified: Secondary | ICD-10-CM | POA: Diagnosis not present

## 2020-03-14 DIAGNOSIS — M25641 Stiffness of right hand, not elsewhere classified: Secondary | ICD-10-CM | POA: Diagnosis not present

## 2020-03-14 DIAGNOSIS — G5601 Carpal tunnel syndrome, right upper limb: Secondary | ICD-10-CM | POA: Diagnosis not present

## 2020-03-14 DIAGNOSIS — R29898 Other symptoms and signs involving the musculoskeletal system: Secondary | ICD-10-CM | POA: Diagnosis not present

## 2020-03-14 DIAGNOSIS — M25531 Pain in right wrist: Secondary | ICD-10-CM | POA: Diagnosis not present

## 2020-03-14 DIAGNOSIS — G5611 Other lesions of median nerve, right upper limb: Secondary | ICD-10-CM | POA: Diagnosis not present

## 2020-03-14 DIAGNOSIS — S5411XD Injury of median nerve at forearm level, right arm, subsequent encounter: Secondary | ICD-10-CM | POA: Diagnosis not present

## 2020-03-18 DIAGNOSIS — M25531 Pain in right wrist: Secondary | ICD-10-CM | POA: Diagnosis not present

## 2020-03-18 DIAGNOSIS — G5601 Carpal tunnel syndrome, right upper limb: Secondary | ICD-10-CM | POA: Diagnosis not present

## 2020-03-18 DIAGNOSIS — R29898 Other symptoms and signs involving the musculoskeletal system: Secondary | ICD-10-CM | POA: Diagnosis not present

## 2020-03-18 DIAGNOSIS — S5411XD Injury of median nerve at forearm level, right arm, subsequent encounter: Secondary | ICD-10-CM | POA: Diagnosis not present

## 2020-03-18 DIAGNOSIS — M25641 Stiffness of right hand, not elsewhere classified: Secondary | ICD-10-CM | POA: Diagnosis not present

## 2020-03-18 DIAGNOSIS — M25631 Stiffness of right wrist, not elsewhere classified: Secondary | ICD-10-CM | POA: Diagnosis not present

## 2020-03-18 DIAGNOSIS — G5611 Other lesions of median nerve, right upper limb: Secondary | ICD-10-CM | POA: Diagnosis not present

## 2020-03-25 ENCOUNTER — Encounter: Payer: Self-pay | Admitting: Nurse Practitioner

## 2020-03-25 ENCOUNTER — Ambulatory Visit (INDEPENDENT_AMBULATORY_CARE_PROVIDER_SITE_OTHER): Payer: Medicaid Other | Admitting: Nurse Practitioner

## 2020-03-25 ENCOUNTER — Other Ambulatory Visit: Payer: Self-pay

## 2020-03-25 VITALS — BP 132/80 | HR 81 | Temp 97.9°F | Resp 18 | Ht 61.0 in | Wt 209.4 lb

## 2020-03-25 DIAGNOSIS — Z6839 Body mass index (BMI) 39.0-39.9, adult: Secondary | ICD-10-CM

## 2020-03-25 DIAGNOSIS — R030 Elevated blood-pressure reading, without diagnosis of hypertension: Secondary | ICD-10-CM | POA: Diagnosis not present

## 2020-03-25 DIAGNOSIS — Z13228 Encounter for screening for other metabolic disorders: Secondary | ICD-10-CM

## 2020-03-25 DIAGNOSIS — D649 Anemia, unspecified: Secondary | ICD-10-CM | POA: Diagnosis not present

## 2020-03-25 DIAGNOSIS — Z0000 Encounter for general adult medical examination without abnormal findings: Secondary | ICD-10-CM

## 2020-03-25 DIAGNOSIS — Z13220 Encounter for screening for lipoid disorders: Secondary | ICD-10-CM | POA: Diagnosis not present

## 2020-03-25 DIAGNOSIS — Z23 Encounter for immunization: Secondary | ICD-10-CM | POA: Diagnosis not present

## 2020-03-25 DIAGNOSIS — Z7689 Persons encountering health services in other specified circumstances: Secondary | ICD-10-CM

## 2020-03-25 DIAGNOSIS — Z0001 Encounter for general adult medical examination with abnormal findings: Secondary | ICD-10-CM | POA: Diagnosis not present

## 2020-03-25 DIAGNOSIS — R600 Localized edema: Secondary | ICD-10-CM

## 2020-03-25 LAB — COMPLETE METABOLIC PANEL WITH GFR
AG Ratio: 1.6 (calc) (ref 1.0–2.5)
ALT: 21 U/L (ref 6–29)
AST: 18 U/L (ref 10–30)
Albumin: 4.2 g/dL (ref 3.6–5.1)
Alkaline phosphatase (APISO): 114 U/L (ref 31–125)
BUN: 15 mg/dL (ref 7–25)
CO2: 26 mmol/L (ref 20–32)
Calcium: 8.9 mg/dL (ref 8.6–10.2)
Chloride: 103 mmol/L (ref 98–110)
Creat: 0.62 mg/dL (ref 0.50–1.10)
GFR, Est African American: 129 mL/min/{1.73_m2} (ref >=60)
GFR, Est Non African American: 111 mL/min/{1.73_m2} (ref >=60)
Globulin: 2.6 g/dL (calc) (ref 1.9–3.7)
Glucose, Bld: 106 mg/dL — ABNORMAL HIGH (ref 65–99)
Potassium: 3.8 mmol/L (ref 3.5–5.3)
Sodium: 136 mmol/L (ref 135–146)
Total Bilirubin: 0.8 mg/dL (ref 0.2–1.2)
Total Protein: 6.8 g/dL (ref 6.1–8.1)

## 2020-03-25 LAB — CBC WITH DIFFERENTIAL/PLATELET
Absolute Monocytes: 396 cells/uL (ref 200–950)
Basophils Absolute: 28 cells/uL (ref 0–200)
Basophils Relative: 0.3 %
Eosinophils Absolute: 101 cells/uL (ref 15–500)
Eosinophils Relative: 1.1 %
HCT: 39.8 % (ref 35.0–45.0)
Hemoglobin: 13.1 g/dL (ref 11.7–15.5)
Lymphs Abs: 3082 cells/uL (ref 850–3900)
MCH: 29.8 pg (ref 27.0–33.0)
MCHC: 32.9 g/dL (ref 32.0–36.0)
MCV: 90.5 fL (ref 80.0–100.0)
MPV: 11.2 fL (ref 7.5–12.5)
Monocytes Relative: 4.3 %
Neutro Abs: 5594 cells/uL (ref 1500–7800)
Neutrophils Relative %: 60.8 %
Platelets: 224 10*3/uL (ref 140–400)
RBC: 4.4 10*6/uL (ref 3.80–5.10)
RDW: 12.8 % (ref 11.0–15.0)
Total Lymphocyte: 33.5 %
WBC: 9.2 10*3/uL (ref 3.8–10.8)

## 2020-03-25 LAB — LIPID PANEL
Cholesterol: 214 mg/dL — ABNORMAL HIGH (ref <200)
HDL: 40 mg/dL — ABNORMAL LOW (ref >=50)
Non-HDL Cholesterol (Calc): 174 mg/dL (calc) — ABNORMAL HIGH (ref <130)
Total CHOL/HDL Ratio: 5.4 (calc) — ABNORMAL HIGH (ref <5.0)
Triglycerides: 661 mg/dL — ABNORMAL HIGH (ref <150)

## 2020-03-25 NOTE — Progress Notes (Signed)
Established Patient Office Visit  Subjective:  Patient ID: Amy Aguilar, female    DOB: 02/08/1976  Age: 44 y.o. MRN: [id number]  CC:  Chief Complaint  Patient presents with  . Establish Care    HPI Amy Aguilar is a 44 year old female presenting to the clinic to establish care and for a general physical examination.  She feels overall good health but does desire to lose weight she reports that she has started to try to decrease her sugars and carbohydrates.  She does report she eats too much tortella shells.  The patient has a history of hysterectomy and is unsure if her cervix looks tired.  She would like to have a vaginal examination and will consider make an appointment later time.  Her last mammogram was November 2020, will place an order and patient understands that she will make an appointment mammogram.  The patient has completed her first chemotherapy treatment on 521 and her second vaccine is scheduled on 618.  She will complete her Tdap is due today.  She has a history of anemia unknown type, seasonal allergy is a non-smoker.  Patient denies chest pain, chest tightness, GU, GI sxs, pain, shortness of breath, edema, palpitations, or falls.    Past Medical History:  Diagnosis Date  . Abnormal uterine bleeding (AUB)   . Anemia    received blood transfusion 2 wks ago  . Blood transfusion without reported diagnosis   . Headache    migraines  . Leiomyoma of uterus   . Uterine fibroid     Past Surgical History:  Procedure Laterality Date  . ABDOMINAL HYSTERECTOMY    . BILATERAL SALPINGECTOMY Bilateral 12/05/2014   Procedure: BILATERAL SALPINGECTOMY;  Surgeon: Margarette Asal, MD;  Location: [facility];  Service: Gynecology;  Laterality: Bilateral;  . CARPAL TUNNEL RELEASE Right 06/28/2018   Procedure: RIGHT CARPAL TUNNEL RELEASE;  Surgeon: Leandrew Koyanagi, MD;  Location: [facility];  Service: Orthopedics;  Laterality:  Right;  . LAPAROSCOPIC ASSISTED VAGINAL HYSTERECTOMY N/A 12/05/2014   Procedure: LAPAROSCOPY, ATTEMPTED VAGINAL HYSTERECTOMY, TOTAL ABDOMINAL HYSTERECTOMY;  Surgeon: Margarette Asal, MD;  Location: [facility];  Service: Gynecology;  Laterality: N/A;  . NERVE REPAIR Right 03/08/2019   Procedure: EXPLORATION MEDIAN NERVE,  NERVE REPAIR WITH WRAP;  Surgeon: Leanora Cover, MD;  Location: [facility];  Service: Orthopedics;  Laterality: Right;    Family History  Problem Relation Age of Onset  . Hypertension Father     Social History   Socioeconomic History  . Marital status: Divorced    Spouse name: Not on file  . Number of children: Not on file  . Years of education: Not on file  . Highest education level: Not on file  Occupational History  . Not on file  Tobacco Use  . Smoking status: Never Smoker  . Smokeless tobacco: Never Used  Substance and Sexual Activity  . Alcohol use: No  . Drug use: No  . Sexual activity: Yes    Birth control/protection: Surgical    Comment: hysterectomy  Other Topics Concern  . Not on file  Social History Narrative   ** Merged History Encounter **       Marital status: married x 17 years; happily married.  From [place]; moved to [place] 2000. Children: 5 children; no grandchildren.  Living: husband, 5 children. Employment: homemaker.   Social Determinants of Health   Financial Resource Strain:   . Difficulty of Paying Living Expenses:  Food Insecurity:   . Worried About Charity fundraiser in the Last Year:   . Arboriculturist in the Last Year:   Transportation Needs:   . Film/video editor (Medical):   Marland Kitchen Lack of Transportation (Non-Medical):   Physical Activity:   . Days of Exercise per Week:   . Minutes of Exercise per Session:   Stress:   . Feeling of Stress :   Social Connections:   . Frequency of Communication with Friends and Family:   . Frequency of Social Gatherings with Friends and Family:   .  Attends Religious Services:   . Active Member of Clubs or Organizations:   . Attends Archivist Meetings:   Marland Kitchen Marital Status:   Intimate Partner Violence:   . Fear of Current or Ex-Partner:   . Emotionally Abused:   Marland Kitchen Physically Abused:   . Sexually Abused:     Outpatient Medications Prior to Visit  Medication Sig Dispense Refill  . cephALEXin (KEFLEX) 500 MG capsule Take 1 capsule (500 mg total) by mouth 2 (two) times daily. 14 capsule 0  . fluconazole (DIFLUCAN) 100 MG tablet Take 1 tablet (100 mg total) by mouth daily. 3 tablet 0  . fluticasone (FLONASE) 50 MCG/ACT nasal spray Place 2 sprays into both nostrils daily. 16 g 6  . HYDROcodone-acetaminophen (NORCO) 5-325 MG tablet 1-2 tabs po q6 hours prn pain (Patient not taking: Reported on 08/15/2019) 30 tablet 0  . loratadine (CLARITIN) 10 MG tablet Take 1 tablet (10 mg total) by mouth daily. 30 tablet 11  . metroNIDAZOLE (FLAGYL) 500 MG tablet Take 1 tablet (500 mg total) by mouth 2 (two) times daily. 14 tablet 0  . sulfamethoxazole-trimethoprim (BACTRIM DS) 800-160 MG tablet Take 1 tablet by mouth 2 (two) times daily. 10 tablet 0  . triamcinolone cream (KENALOG) 0.1 % Apply 1 application topically 2 (two) times daily. 30 g 3   No facility-administered medications prior to visit.    No Known Allergies  ROS Review of Systems  All other systems reviewed and are negative.     Objective:    Physical Exam  Constitutional: She is oriented to person, place, and time. Vital signs are normal. She appears well-developed and well-nourished.  HENT:  Head: Normocephalic.  Right Ear: Hearing and ear canal normal.  Left Ear: Hearing and ear canal normal.  Nose: Nose normal.  Mouth/Throat: Uvula is midline, oropharynx is clear and moist and mucous membranes are normal.  Eyes: Pupils are equal, round, and reactive to light. Conjunctivae, EOM and lids are normal. Lids are everted and swept, no foreign bodies found.  Neck:  Trachea normal. No JVD present. Carotid bruit is not present. No thyromegaly present.  Cardiovascular: Normal rate, regular rhythm, S1 normal, S2 normal and normal heart sounds.  Pulmonary/Chest: Effort normal and breath sounds normal.  Abdominal: Soft. Normal aorta and bowel sounds are normal. There is no hepatosplenomegaly. There is no CVA tenderness.  Musculoskeletal:        General: No edema. Normal range of motion.     Cervical back: Normal range of motion and neck supple.  Lymphadenopathy:       Head (right side): No submental, no submandibular and no tonsillar adenopathy present.       Head (left side): No submental, no submandibular and no tonsillar adenopathy present.    She has no cervical adenopathy.  Neurological: She is alert and oriented to person, place, and time. She has normal strength.  No cranial nerve deficit. She displays a negative Romberg sign. Coordination and gait normal.  Skin: Skin is warm, dry and intact.  Psychiatric: She has a normal mood and affect. Her speech is normal and behavior is normal. Judgment and thought content normal. Cognition and memory are normal.  Nursing note and vitals reviewed.   BP 132/80 (BP Location: Left Arm, Patient Position: Sitting, Cuff Size: Large)   Pulse 81   Temp 97.9 F (36.6 C) (Temporal)   Resp 18   Ht 5\' 1"  (1.549 m)   Wt 209 lb 6.4 oz (95 kg)   LMP 11/21/2014   SpO2 97%   BMI 39.57 kg/m  Wt Readings from Last 3 Encounters:  03/25/20 209 lb 6.4 oz (95 kg)  03/08/19 198 lb 13.7 oz (90.2 kg)  09/25/18 194 lb (88 kg)     Health Maintenance Due  Topic Date Due  . COVID-19 Vaccine (1) Never done  . PAP SMEAR-Modifier  Never done    There are no preventive care reminders to display for this patient.  No results found for: TSH Lab Results  Component Value Date   WBC 11.5 (H) 03/08/2019   HGB 12.9 03/08/2019   HCT 39.6 03/08/2019   MCV 91.7 03/08/2019   PLT 227 03/08/2019   Lab Results  Component Value Date    NA 139 03/08/2019   K 3.8 03/08/2019   CO2 23 03/08/2019   GLUCOSE 118 (H) 03/08/2019   BUN 9 03/08/2019   CREATININE 0.94 03/08/2019   BILITOT 1.1 02/21/2018   ALKPHOS 102 02/21/2018   AST 24 02/21/2018   ALT 20 02/21/2018   PROT 7.1 02/21/2018   ALBUMIN 4.2 02/21/2018   CALCIUM 9.0 03/08/2019   ANIONGAP 11 03/08/2019   No results found for: CHOL No results found for: HDL No results found for: LDLCALC No results found for: TRIG No results found for: CHOLHDL No results found for: HGBA1C    Assessment & Plan:   Problem List Items Addressed This Visit      Other   Anemia   Relevant Orders   CBC with Differential/Platelet    Other Visit Diagnoses    Adult general medical exam    -  Primary   Screening, lipid       Relevant Orders   Lipid panel   Screening for metabolic disorder       Relevant Orders   CBC with Differential/Platelet   COMPLETE METABOLIC PANEL WITH GFR   Encounter to establish care       Adult BMI 39.0-39.9 kg/sq m       Elevated blood pressure reading       Pedal edema       Need for tetanus, diphtheria, and acellular pertussis (Tdap) vaccine       Relevant Orders   Tdap vaccine greater than or equal to 7yo IM (Completed)    1.  Follow a low-cholesterol, low-fat, low carbohydrate, low sugar diet not to exceed 2000 mg of sodium in a 24-hour.  Keep up the good work with your recent decrease carbohydrate and sugars. 2.  Drink plenty of water 3.  Try to get exercise 20 minutes/day 4 days/week.  Keep up the good work of your recent walking. 4.. Establish care, we have completed labs drawn today, we will call you with abnormal lab results with directions as indicated. 5.  You have received a Tdap vaccination today.  No reaction after 10 minutes and you tolerated well. 6.  Consider a pelvic examination as you have not completed one in over 20 years with a history of hysterectomy question if surgeon left cervix. 7.  You have completed your mammogram in  November 2020, I will place an order for future date of November 2021 you will need to make an appointment with a mammogram, [facility] provider. 8.  You reported occasional pedal edema (feet) your blood pressure today was 132/80.  Please keep blood pressure log at home over the next week to 2 weeks recording the readings to the clinic.  I would like to evaluate for stage I hypertension.  No orders of the defined types were placed in this encounter.   Follow-up: No follow-ups on file.    Annie Main, FNP

## 2020-03-25 NOTE — Addendum Note (Signed)
Addended by: Ishmael Holter on: 03/25/2020 09:56 AM   Modules accepted: Level of Service

## 2020-03-25 NOTE — Patient Instructions (Signed)
1.  Follow a low-cholesterol, low-fat, low carbohydrate, low sugar diet not to exceed 2000 mg of sodium in a 24-hour.  Keep up the good work with your recent decrease carbohydrate and sugars. 2.  Drink plenty of water 3.  Try to get exercise 20 minutes/day 4 days/week.  Keep up the good work of your recent walking. 4.. Establish care, we have completed labs drawn today, we will call you with abnormal lab results with directions as indicated. 5.  You have received a Tdap vaccination today. 6.  Consider a pelvic examination as you have not completed one in over 20 years with a history of hysterectomy question if surgeon left cervix. 7.  You have completed your mammogram in November 2020, I will place an order for future date of November 2021 you will need to make an appointment with a mammogram, [facility] provider. 8.  You reported occasional pedal edema (feet) your blood pressure today was 132/80.  Please keep blood pressure log at home over the next week to 2 weeks recording the readings to the clinic.  I would like to evaluate for stage I hypertension.

## 2020-03-25 NOTE — Addendum Note (Signed)
Addended by: Vanice Sarah on: 03/25/2020 10:38 AM   Modules accepted: Orders

## 2020-03-26 ENCOUNTER — Other Ambulatory Visit: Payer: Self-pay | Admitting: Nurse Practitioner

## 2020-03-26 DIAGNOSIS — R7309 Other abnormal glucose: Secondary | ICD-10-CM

## 2020-03-26 DIAGNOSIS — E785 Hyperlipidemia, unspecified: Secondary | ICD-10-CM

## 2020-03-26 MED ORDER — ROSUVASTATIN CALCIUM 10 MG PO TABS: 10.0000 mg | ORAL_TABLET | Freq: Every day | ORAL | 3 refills | Status: DC

## 2020-03-26 MED ORDER — OMEGA-3-ACID ETHYL ESTERS 1 G PO CAPS: 1.0000 g | ORAL_CAPSULE | Freq: Every day | ORAL | 6 refills | Status: DC

## 2020-04-03 DIAGNOSIS — G5611 Other lesions of median nerve, right upper limb: Secondary | ICD-10-CM | POA: Diagnosis not present

## 2020-04-03 DIAGNOSIS — M25631 Stiffness of right wrist, not elsewhere classified: Secondary | ICD-10-CM | POA: Diagnosis not present

## 2020-04-03 DIAGNOSIS — R29898 Other symptoms and signs involving the musculoskeletal system: Secondary | ICD-10-CM | POA: Diagnosis not present

## 2020-04-03 DIAGNOSIS — S5411XD Injury of median nerve at forearm level, right arm, subsequent encounter: Secondary | ICD-10-CM | POA: Diagnosis not present

## 2020-04-03 DIAGNOSIS — G5601 Carpal tunnel syndrome, right upper limb: Secondary | ICD-10-CM | POA: Diagnosis not present

## 2020-04-03 DIAGNOSIS — M25531 Pain in right wrist: Secondary | ICD-10-CM | POA: Diagnosis not present

## 2020-04-03 DIAGNOSIS — M25641 Stiffness of right hand, not elsewhere classified: Secondary | ICD-10-CM | POA: Diagnosis not present

## 2020-04-07 DIAGNOSIS — G5601 Carpal tunnel syndrome, right upper limb: Secondary | ICD-10-CM | POA: Diagnosis not present

## 2020-04-07 DIAGNOSIS — S5411XD Injury of median nerve at forearm level, right arm, subsequent encounter: Secondary | ICD-10-CM | POA: Diagnosis not present

## 2020-04-15 IMAGING — DX CHEST - 2 VIEW
2 series · 2 of 2 positions shown · non-contrast
Comparison: Radiograph 07/07/2017

CLINICAL DATA: Shortness of breath. Patient reports having right
wrist surgery earlier today.

EXAM:
CHEST - 2 VIEW

[chest lat]
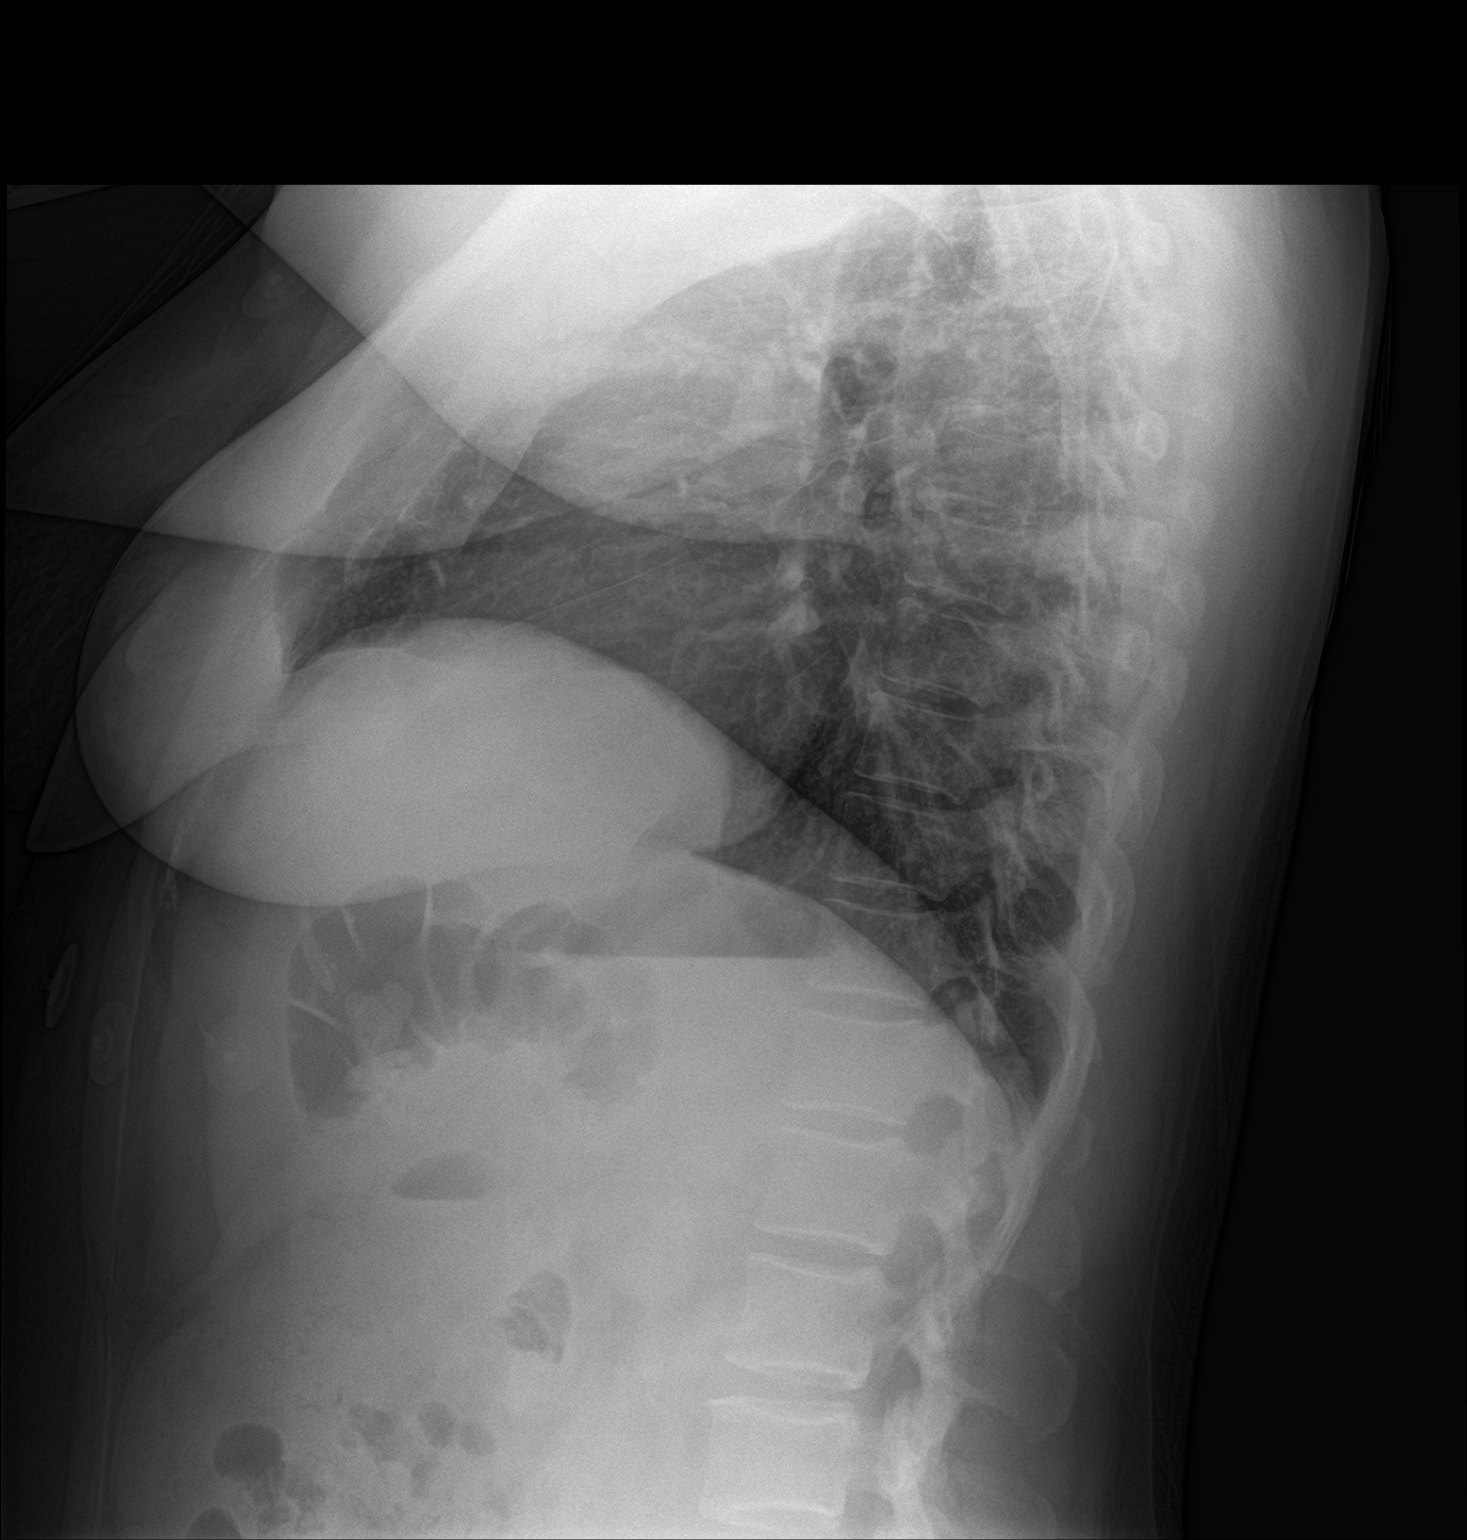

[chest ap]
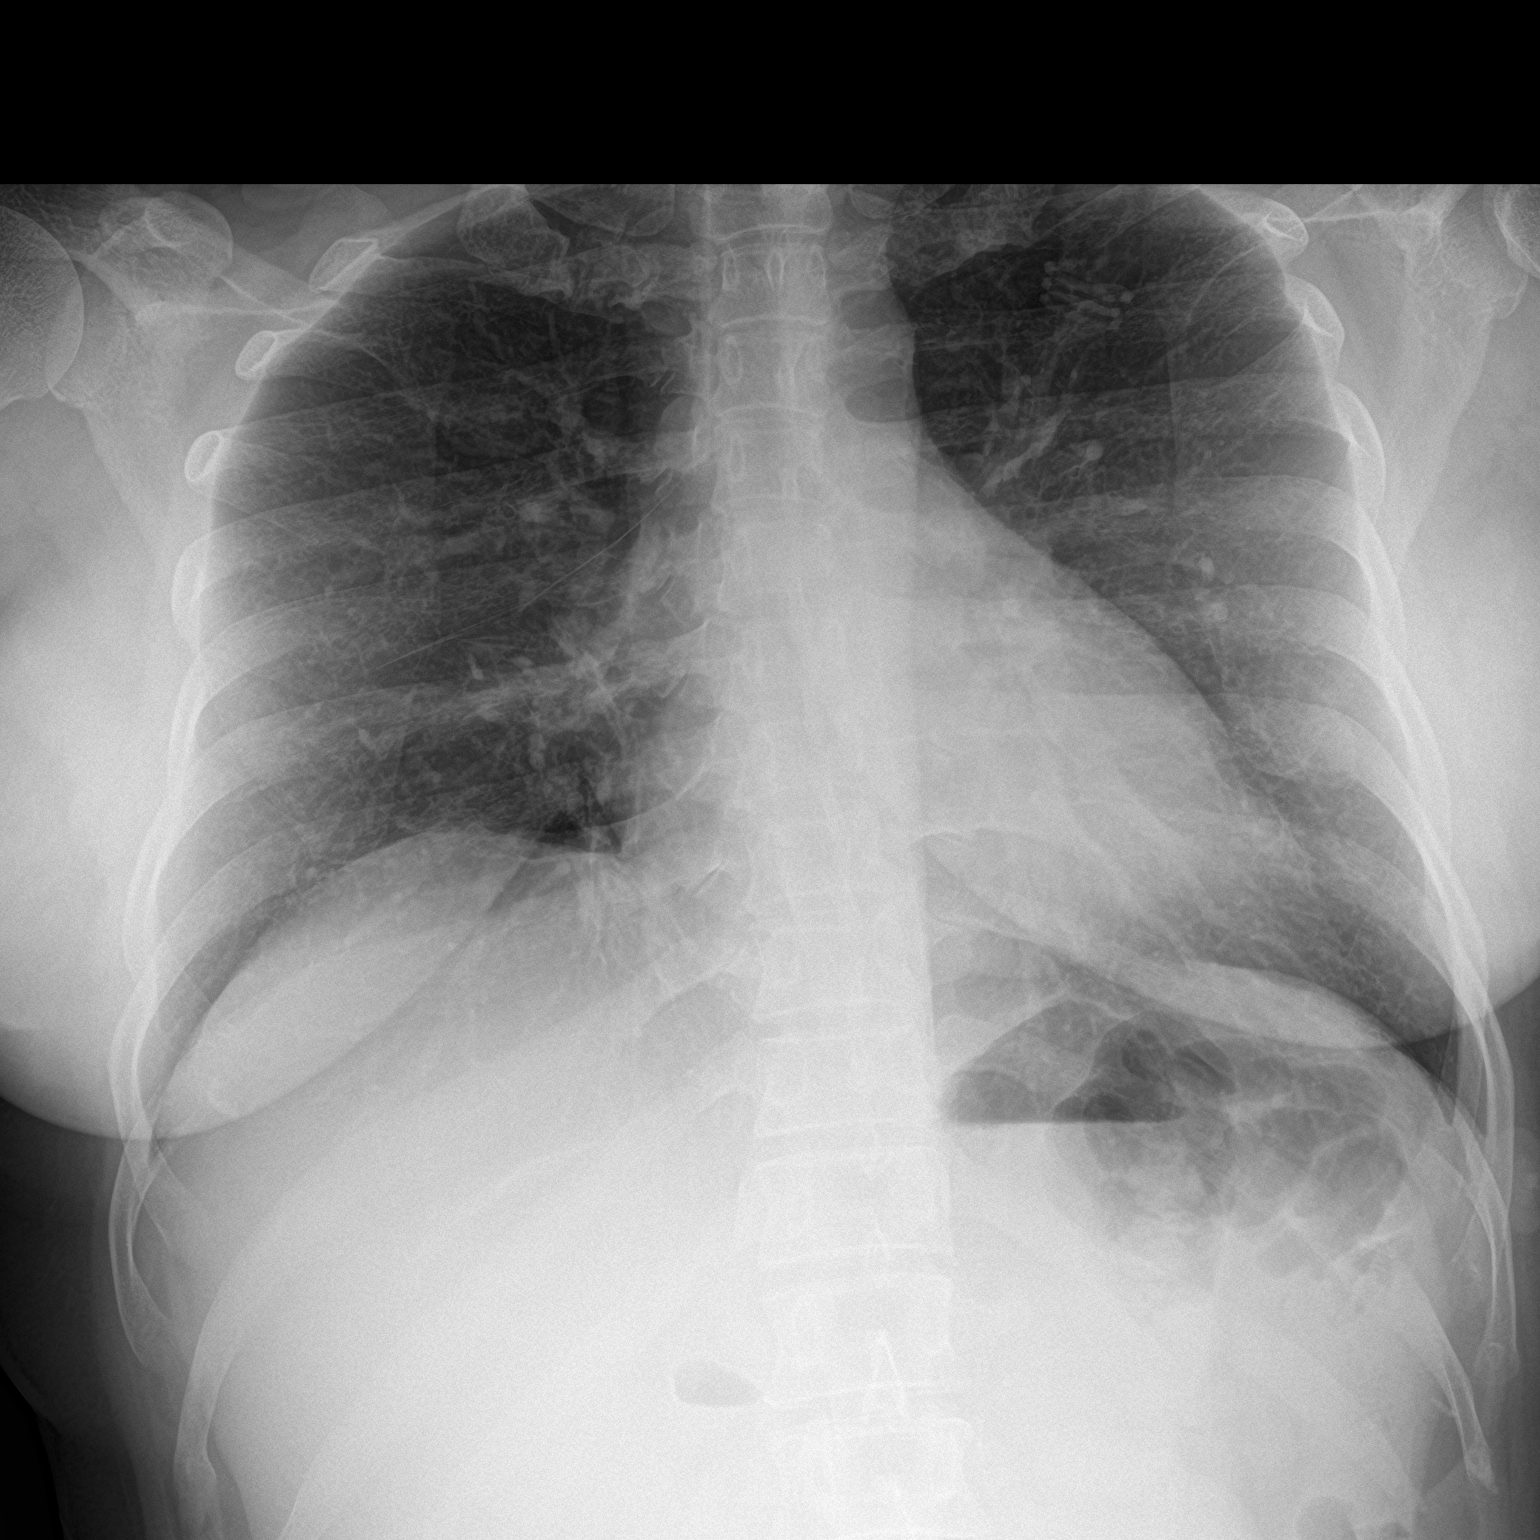

[2 of 2 positions shown; findings below may reference images not displayed]

FINDINGS: The cardiomediastinal contours are normal. The lungs are clear.
Pulmonary vasculature is normal. No consolidation, pleural effusion,
or pneumothorax. No acute osseous abnormalities are seen.
IMPRESSION: No acute pulmonary process.

## 2020-04-16 DIAGNOSIS — M25631 Stiffness of right wrist, not elsewhere classified: Secondary | ICD-10-CM | POA: Diagnosis not present

## 2020-04-16 DIAGNOSIS — R29898 Other symptoms and signs involving the musculoskeletal system: Secondary | ICD-10-CM | POA: Diagnosis not present

## 2020-04-16 DIAGNOSIS — M25531 Pain in right wrist: Secondary | ICD-10-CM | POA: Diagnosis not present

## 2020-04-16 DIAGNOSIS — M25641 Stiffness of right hand, not elsewhere classified: Secondary | ICD-10-CM | POA: Diagnosis not present

## 2020-04-16 DIAGNOSIS — G5601 Carpal tunnel syndrome, right upper limb: Secondary | ICD-10-CM | POA: Diagnosis not present

## 2020-04-16 DIAGNOSIS — G5611 Other lesions of median nerve, right upper limb: Secondary | ICD-10-CM | POA: Diagnosis not present

## 2020-04-16 DIAGNOSIS — S5411XD Injury of median nerve at forearm level, right arm, subsequent encounter: Secondary | ICD-10-CM | POA: Diagnosis not present

## 2020-05-05 DIAGNOSIS — R29898 Other symptoms and signs involving the musculoskeletal system: Secondary | ICD-10-CM | POA: Diagnosis not present

## 2020-05-05 DIAGNOSIS — S5411XD Injury of median nerve at forearm level, right arm, subsequent encounter: Secondary | ICD-10-CM | POA: Diagnosis not present

## 2020-05-05 DIAGNOSIS — M25631 Stiffness of right wrist, not elsewhere classified: Secondary | ICD-10-CM | POA: Diagnosis not present

## 2020-05-05 DIAGNOSIS — M25641 Stiffness of right hand, not elsewhere classified: Secondary | ICD-10-CM | POA: Diagnosis not present

## 2020-05-05 DIAGNOSIS — M25531 Pain in right wrist: Secondary | ICD-10-CM | POA: Diagnosis not present

## 2020-05-05 DIAGNOSIS — G5601 Carpal tunnel syndrome, right upper limb: Secondary | ICD-10-CM | POA: Diagnosis not present

## 2020-05-12 DIAGNOSIS — M25631 Stiffness of right wrist, not elsewhere classified: Secondary | ICD-10-CM | POA: Diagnosis not present

## 2020-05-12 DIAGNOSIS — M25531 Pain in right wrist: Secondary | ICD-10-CM | POA: Diagnosis not present

## 2020-05-12 DIAGNOSIS — M25641 Stiffness of right hand, not elsewhere classified: Secondary | ICD-10-CM | POA: Diagnosis not present

## 2020-05-12 DIAGNOSIS — G5601 Carpal tunnel syndrome, right upper limb: Secondary | ICD-10-CM | POA: Diagnosis not present

## 2020-05-12 DIAGNOSIS — M79644 Pain in right finger(s): Secondary | ICD-10-CM | POA: Diagnosis not present

## 2020-05-12 DIAGNOSIS — S5411XD Injury of median nerve at forearm level, right arm, subsequent encounter: Secondary | ICD-10-CM | POA: Diagnosis not present

## 2020-05-12 DIAGNOSIS — R29898 Other symptoms and signs involving the musculoskeletal system: Secondary | ICD-10-CM | POA: Diagnosis not present

## 2020-05-12 DIAGNOSIS — G5611 Other lesions of median nerve, right upper limb: Secondary | ICD-10-CM | POA: Diagnosis not present

## 2020-05-13 ENCOUNTER — Ambulatory Visit (INDEPENDENT_AMBULATORY_CARE_PROVIDER_SITE_OTHER): Payer: Medicaid Other | Admitting: Nurse Practitioner

## 2020-05-13 ENCOUNTER — Other Ambulatory Visit: Payer: Self-pay | Admitting: Nurse Practitioner

## 2020-05-13 ENCOUNTER — Other Ambulatory Visit: Payer: Self-pay

## 2020-05-13 VITALS — BP 106/68 | HR 88 | Temp 97.6°F | Resp 18 | Wt 208.4 lb

## 2020-05-13 DIAGNOSIS — N644 Mastodynia: Secondary | ICD-10-CM

## 2020-05-13 DIAGNOSIS — N6310 Unspecified lump in the right breast, unspecified quadrant: Secondary | ICD-10-CM

## 2020-05-13 NOTE — Progress Notes (Signed)
Established Patient Office Visit  Subjective:  Patient ID: Amy Aguilar, female    DOB: 01-14-1976  Age: 44 y.o. MRN: [id number]  CC:  Chief Complaint  Patient presents with  . Breast Pain    R breast, started 07/18, feels a lump    HPI Amy Aguilar is a 44 year old female presenting to the clinic for right breast pain and breast mass. The sxs started Sunday. She has tried no treated. She denied fever, chills, sxs like this prior, nipple discharge, inverted nipple, breast feeding. Last mammogram 08/2019 for screening, no menstrual h/o total hysterectomy.   Past Medical History:  Diagnosis Date  . Abnormal uterine bleeding (AUB)   . Anemia    received blood transfusion 2 wks ago  . Blood transfusion without reported diagnosis   . Headache    migraines  . Leiomyoma of uterus   . Uterine fibroid     Past Surgical History:  Procedure Laterality Date  . ABDOMINAL HYSTERECTOMY    . BILATERAL SALPINGECTOMY Bilateral 12/05/2014   Procedure: BILATERAL SALPINGECTOMY;  Surgeon: Margarette Asal, MD;  Location: [facility];  Service: Gynecology;  Laterality: Bilateral;  . CARPAL TUNNEL RELEASE Right 06/28/2018   Procedure: RIGHT CARPAL TUNNEL RELEASE;  Surgeon: Leandrew Koyanagi, MD;  Location: [place];  Service: Orthopedics;  Laterality: Right;  . LAPAROSCOPIC ASSISTED VAGINAL HYSTERECTOMY N/A 12/05/2014   Procedure: LAPAROSCOPY, ATTEMPTED VAGINAL HYSTERECTOMY, TOTAL ABDOMINAL HYSTERECTOMY;  Surgeon: Margarette Asal, MD;  Location: [place];  Service: Gynecology;  Laterality: N/A;  . NERVE REPAIR Right 03/08/2019   Procedure: EXPLORATION MEDIAN NERVE,  NERVE REPAIR WITH WRAP;  Surgeon: Leanora Cover, MD;  Location: [place];  Service: Orthopedics;  Laterality: Right;    Family History  Problem Relation Age of Onset  . Hypertension Father     Social History   Socioeconomic History  .  Marital status: Divorced    Spouse name: Not on file  . Number of children: Not on file  . Years of education: Not on file  . Highest education level: Not on file  Occupational History  . Not on file  Tobacco Use  . Smoking status: Never Smoker  . Smokeless tobacco: Never Used  Vaping Use  . Vaping Use: Never used  Substance and Sexual Activity  . Alcohol use: No  . Drug use: No  . Sexual activity: Yes    Birth control/protection: Surgical    Comment: hysterectomy  Other Topics Concern  . Not on file  Social History Narrative   ** Merged History Encounter **       Marital status: married x 17 years; happily married.  From [place]; moved to [place] 2000. Children: 5 children; no grandchildren.  Living: husband, 5 children. Employment: homemaker.   Social Determinants of Health   Financial Resource Strain:   . Difficulty of Paying Living Expenses:   Food Insecurity:   . Worried About Charity fundraiser in the Last Year:   . Arboriculturist in the Last Year:   Transportation Needs:   . Film/video editor (Medical):   Marland Kitchen Lack of Transportation (Non-Medical):   Physical Activity:   . Days of Exercise per Week:   . Minutes of Exercise per Session:   Stress:   . Feeling of Stress :   Social Connections:   . Frequency of Communication with Friends and Family:   . Frequency of Social Gatherings with Friends  and Family:   . Attends Religious Services:   . Active Member of Clubs or Organizations:   . Attends Archivist Meetings:   Marland Kitchen Marital Status:   Intimate Partner Violence:   . Fear of Current or Ex-Partner:   . Emotionally Abused:   Marland Kitchen Physically Abused:   . Sexually Abused:     Outpatient Medications Prior to Visit  Medication Sig Dispense Refill  . omega-3 acid ethyl esters (LOVAZA) 1 g capsule Take 1 capsule (1 g total) by mouth daily. 30 capsule 6  . rosuvastatin (CRESTOR) 10 MG tablet Take 1 tablet (10 mg total) by mouth daily. 90 tablet 3   No  facility-administered medications prior to visit.    No Known Allergies  ROS Review of Systems  All other systems reviewed and are negative.     Objective:    Physical Exam Vitals and nursing note reviewed. Exam conducted with a chaperone present.  Constitutional:      Appearance: Normal appearance.  HENT:     Head: Normocephalic.  Eyes:     Extraocular Movements: Extraocular movements intact.     Conjunctiva/sclera: Conjunctivae normal.     Pupils: Pupils are equal, round, and reactive to light.  Cardiovascular:     Rate and Rhythm: Normal rate.  Pulmonary:     Effort: Pulmonary effort is normal.  Chest:     Breasts:        Right: Mass and tenderness present. No swelling, bleeding, inverted nipple, nipple discharge or skin change.        Left: Normal.    Musculoskeletal:        General: Normal range of motion.     Cervical back: Normal range of motion and neck supple.     Right lower leg: No edema.     Left lower leg: No edema.  Skin:    Coloration: Skin is not jaundiced or pale.  Neurological:     General: No focal deficit present.     Mental Status: She is alert and oriented to person, place, and time.     GCS: GCS eye subscore is 4. GCS verbal subscore is 5. GCS motor subscore is 6.  Psychiatric:        Attention and Perception: Attention and perception normal.        Mood and Affect: Mood and affect normal.        Speech: Speech normal.        Behavior: Behavior normal. Behavior is cooperative.        Cognition and Memory: Cognition normal.        Judgment: Judgment normal.     BP 106/68 (BP Location: Left Arm, Patient Position: Sitting, Cuff Size: Large)   Pulse 88   Temp 97.6 F (36.4 C) (Temporal)   Resp 18   Wt 208 lb 6.4 oz (94.5 kg)   LMP 11/21/2014   SpO2 97%   BMI 39.38 kg/m  Wt Readings from Last 3 Encounters:  05/13/20 208 lb 6.4 oz (94.5 kg)  03/25/20 209 lb 6.4 oz (95 kg)  03/08/19 198 lb 13.7 oz (90.2 kg)     Health Maintenance  Due  Topic Date Due  . Hepatitis C Screening  Never done  . COVID-19 Vaccine (1) Never done  . PAP SMEAR-Modifier  Never done    There are no preventive care reminders to display for this patient.  No results found for: TSH Lab Results  Component Value Date   WBC  9.2 03/25/2020   HGB 13.1 03/25/2020   HCT 39.8 03/25/2020   MCV 90.5 03/25/2020   PLT 224 03/25/2020   Lab Results  Component Value Date   NA 136 03/25/2020   K 3.8 03/25/2020   CO2 26 03/25/2020   GLUCOSE 106 (H) 03/25/2020   BUN 15 03/25/2020   CREATININE 0.62 03/25/2020   BILITOT 0.8 03/25/2020   ALKPHOS 102 02/21/2018   AST 18 03/25/2020   ALT 21 03/25/2020   PROT 6.8 03/25/2020   ALBUMIN 4.2 02/21/2018   CALCIUM 8.9 03/25/2020   ANIONGAP 11 03/08/2019   Lab Results  Component Value Date   CHOL 214 (H) 03/25/2020   Lab Results  Component Value Date   HDL 40 (L) 03/25/2020   Lab Results  Component Value Date   [facility]  03/25/2020     Comment:     . LDL cholesterol not calculated. Triglyceride levels greater than 400 mg/dL invalidate calculated LDL results. . Reference range: <100 . Desirable range <100 mg/dL for primary prevention;   <70 mg/dL for patients with CHD or diabetic patients  with > or = 2 CHD risk factors. Marland Kitchen LDL-C is now calculated using the Martin-Hopkins  calculation, which is a validated novel method providing  better accuracy than the Friedewald equation in the  estimation of LDL-C.  Cresenciano Genre et al. Annamaria Helling. 9741;638(45): 2061-2068  (http://education.[facility].com/faq/FAQ164)    Lab Results  Component Value Date   TRIG 661 (H) 03/25/2020   Lab Results  Component Value Date   CHOLHDL 5.4 (H) 03/25/2020   No results found for: HGBA1C    Assessment & Plan:   Problem List Items Addressed This Visit    None    Visit Diagnoses    Breast pain, right    -  Primary   Relevant Orders   MM Digital Diagnostic Unilat R   Breast mass, right         Diagnostic  Mammo schedule today May take tylenol/ibuprofen otc pain relievers  No orders of the defined types were placed in this encounter.   Follow-up: Return if symptoms worsen or fail to improve.    Annie Main, FNP

## 2020-05-27 ENCOUNTER — Other Ambulatory Visit: Payer: Medicaid Other

## 2020-05-28 ENCOUNTER — Other Ambulatory Visit: Payer: Medicaid Other

## 2020-06-12 DIAGNOSIS — R52 Pain, unspecified: Secondary | ICD-10-CM | POA: Diagnosis not present

## 2020-06-12 DIAGNOSIS — M25631 Stiffness of right wrist, not elsewhere classified: Secondary | ICD-10-CM | POA: Diagnosis not present

## 2020-06-12 DIAGNOSIS — S5411XD Injury of median nerve at forearm level, right arm, subsequent encounter: Secondary | ICD-10-CM | POA: Diagnosis not present

## 2020-06-12 DIAGNOSIS — M25641 Stiffness of right hand, not elsewhere classified: Secondary | ICD-10-CM | POA: Diagnosis not present

## 2020-06-12 DIAGNOSIS — M25531 Pain in right wrist: Secondary | ICD-10-CM | POA: Diagnosis not present

## 2020-06-12 DIAGNOSIS — M79644 Pain in right finger(s): Secondary | ICD-10-CM | POA: Diagnosis not present

## 2020-06-12 DIAGNOSIS — Z4789 Encounter for other orthopedic aftercare: Secondary | ICD-10-CM | POA: Diagnosis not present

## 2020-06-12 DIAGNOSIS — R29898 Other symptoms and signs involving the musculoskeletal system: Secondary | ICD-10-CM | POA: Diagnosis not present

## 2020-06-16 DIAGNOSIS — G5601 Carpal tunnel syndrome, right upper limb: Secondary | ICD-10-CM | POA: Diagnosis not present

## 2020-07-03 ENCOUNTER — Other Ambulatory Visit: Payer: Self-pay

## 2020-07-03 ENCOUNTER — Ambulatory Visit
Admission: RE | Admit: 2020-07-03 | Discharge: 2020-07-03 | Disposition: A | Discharge status: Home / Self Care | Payer: Medicaid Other | Source: Ambulatory Visit | Attending: Nurse Practitioner | Admitting: Nurse Practitioner

## 2020-07-03 DIAGNOSIS — Z4789 Encounter for other orthopedic aftercare: Secondary | ICD-10-CM | POA: Diagnosis not present

## 2020-07-03 DIAGNOSIS — N644 Mastodynia: Secondary | ICD-10-CM

## 2020-07-03 DIAGNOSIS — G5611 Other lesions of median nerve, right upper limb: Secondary | ICD-10-CM | POA: Diagnosis not present

## 2020-07-03 DIAGNOSIS — R29898 Other symptoms and signs involving the musculoskeletal system: Secondary | ICD-10-CM | POA: Diagnosis not present

## 2020-07-03 DIAGNOSIS — G5601 Carpal tunnel syndrome, right upper limb: Secondary | ICD-10-CM | POA: Diagnosis not present

## 2020-07-03 DIAGNOSIS — M79644 Pain in right finger(s): Secondary | ICD-10-CM | POA: Diagnosis not present

## 2020-07-03 DIAGNOSIS — M25431 Effusion, right wrist: Secondary | ICD-10-CM | POA: Diagnosis not present

## 2020-07-03 DIAGNOSIS — R52 Pain, unspecified: Secondary | ICD-10-CM | POA: Diagnosis not present

## 2020-07-03 DIAGNOSIS — M25531 Pain in right wrist: Secondary | ICD-10-CM | POA: Diagnosis not present

## 2020-07-03 DIAGNOSIS — N6011 Diffuse cystic mastopathy of right breast: Secondary | ICD-10-CM | POA: Diagnosis not present

## 2020-07-03 DIAGNOSIS — R922 Inconclusive mammogram: Secondary | ICD-10-CM | POA: Diagnosis not present

## 2020-07-03 DIAGNOSIS — S5411XD Injury of median nerve at forearm level, right arm, subsequent encounter: Secondary | ICD-10-CM | POA: Diagnosis not present

## 2020-07-11 ENCOUNTER — Ambulatory Visit (INDEPENDENT_AMBULATORY_CARE_PROVIDER_SITE_OTHER): Payer: Medicaid Other | Admitting: Family Medicine

## 2020-07-11 ENCOUNTER — Other Ambulatory Visit: Payer: Self-pay

## 2020-07-11 VITALS — BP 128/66 | HR 79 | Temp 97.6°F | Resp 18 | Wt 212.0 lb

## 2020-07-11 DIAGNOSIS — G5611 Other lesions of median nerve, right upper limb: Secondary | ICD-10-CM

## 2020-07-11 MED ORDER — PREDNISONE 20 MG PO TABS: ORAL_TABLET | ORAL | 0 refills | Status: DC

## 2020-07-11 NOTE — Progress Notes (Signed)
Subjective:    Patient ID: Amy Aguilar, female    DOB: Sep 08, 1976, 44 y.o.   MRN: [id number]  HPI Patient is a very pleasant 44 year old Hispanic female who presents today complaining of pain in her right arm.  She states that the problems began in 2017.  She started a job and developed symptoms of carpal tunnel syndrome.  She originally had surgery on her right wrist for the carpal tunnel syndrome however after the surgery she lost all the feeling in the distribution of the median nerve specifically in her thumb and index finger.  She ultimately saw Dr. Leanora Cover who performed a surgical revision.  His operative note reports repairing 3 fascicles of the median nerve.  However after the second surgery she continued to have symptoms of nerve palsy in her hand including numbness and tingling and burning pain in the distribution of the median nerve.  She was referred to a specialist at [facility].  They are planning a 3rd nerve conduction study next week.  She continues to have numbness and tingling in her right hand distal to the wrist in the distribution of the median nerve however she is now also having paresthesias and burning pain radiating up her right arm into her right shoulder and up the right side of her neck.  She reports stinging pain shooting up and down her right arm.  The pain is severe.  She is tearful when she describes it.  She states that she is depressed because her situation is not getting better and now she is having trouble sleeping due to the pain and she is unable to work. Past Medical History:  Diagnosis Date   Abnormal uterine bleeding (AUB)    Anemia    received blood transfusion 2 wks ago   Blood transfusion without reported diagnosis    Headache    migraines   Leiomyoma of uterus    Uterine fibroid    Past Surgical History:  Procedure Laterality Date   ABDOMINAL HYSTERECTOMY     BILATERAL SALPINGECTOMY Bilateral 12/05/2014   Procedure: BILATERAL  SALPINGECTOMY;  Surgeon: Margarette Asal, MD;  Location: [facility];  Service: Gynecology;  Laterality: Bilateral;   CARPAL TUNNEL RELEASE Right 06/28/2018   Procedure: RIGHT CARPAL TUNNEL RELEASE;  Surgeon: Leandrew Koyanagi, MD;  Location: [place];  Service: Orthopedics;  Laterality: Right;   LAPAROSCOPIC ASSISTED VAGINAL HYSTERECTOMY N/A 12/05/2014   Procedure: LAPAROSCOPY, ATTEMPTED VAGINAL HYSTERECTOMY, TOTAL ABDOMINAL HYSTERECTOMY;  Surgeon: Margarette Asal, MD;  Location: [place];  Service: Gynecology;  Laterality: N/A;   NERVE REPAIR Right 03/08/2019   Procedure: EXPLORATION MEDIAN NERVE,  NERVE REPAIR WITH WRAP;  Surgeon: Leanora Cover, MD;  Location: [place];  Service: Orthopedics;  Laterality: Right;   No current outpatient medications on file prior to visit.   No current facility-administered medications on file prior to visit.   No Known Allergies Social History   Socioeconomic History   Marital status: Divorced    Spouse name: Not on file   Number of children: Not on file   Years of education: Not on file   Highest education level: Not on file  Occupational History   Not on file  Tobacco Use   Smoking status: Never Smoker   Smokeless tobacco: Never Used  Vaping Use   Vaping Use: Never used  Substance and Sexual Activity   Alcohol use: No   Drug use: No   Sexual activity: Yes  Birth control/protection: Surgical    Comment: hysterectomy  Other Topics Concern   Not on file  Social History Narrative   ** Merged History Encounter **       Marital status: married x 17 years; happily married.  From [place]; moved to [place] 2000. Children: 5 children; no grandchildren.  Living: husband, 5 children. Employment: homemaker.   Social Determinants of Health   Financial Resource Strain:    Difficulty of Paying Living Expenses: Not on file  Food Insecurity:    Worried About Sales executive in the Last Year: Not on file   [employer] of Food in the Last Year: Not on file  Transportation Needs:    Lack of Transportation (Medical): Not on file   Lack of Transportation (Non-Medical): Not on file  Physical Activity:    Days of Exercise per Week: Not on file   Minutes of Exercise per Session: Not on file  Stress:    Feeling of Stress : Not on file  Social Connections:    Frequency of Communication with Friends and Family: Not on file   Frequency of Social Gatherings with Friends and Family: Not on file   Attends Religious Services: Not on file   Active Member of Clubs or Organizations: Not on file   Attends Archivist Meetings: Not on file   Marital Status: Not on file  Intimate Partner Violence:    Fear of Current or Ex-Partner: Not on file   Emotionally Abused: Not on file   Physically Abused: Not on file   Sexually Abused: Not on file      Review of Systems  All other systems reviewed and are negative.      Objective:   Physical Exam Constitutional:      Appearance: Normal appearance. She is normal weight.  Cardiovascular:     Rate and Rhythm: Normal rate and regular rhythm.     Heart sounds: Normal heart sounds.  Pulmonary:     Effort: Pulmonary effort is normal.     Breath sounds: Normal breath sounds.  Musculoskeletal:     Right wrist: Swelling and tenderness present. Decreased range of motion.     Right hand: Tenderness present. Decreased range of motion. Decreased strength. Decreased sensation of the median distribution. There is disruption of two-point discrimination.       Hands:  Neurological:     Mental Status: She is alert.           Assessment & Plan:  Right median nerve neuropathy  Patient has chronic right median neuropathy.  I am not sure if there was nerve injury after her first surgery or the exact cause however this has been persistent despite having two surgeries.  She has been referred to a third  specialist and has a nerve conduction study pending.  However she is now having neuropathic pain radiating up and down her right arm and into her right neck.  While this can still potentially be due to neurolysis of the right median nerve, it is possible she may also have an element of cervical radiculopathy.  She would like to try a prednisone taper pack in case she does have cervical radiculopathy.  She is tried and failed gabapentin in the past.  Recheck in 1 week after her nerve conduction studies to discuss further.  If nerve conduction studies confirm chronic right median neuropathy, we could try Lyrica versus nortriptyline for neuropathic pain in the right extremity.

## 2020-07-14 DIAGNOSIS — M25531 Pain in right wrist: Secondary | ICD-10-CM | POA: Diagnosis not present

## 2020-07-14 DIAGNOSIS — M79644 Pain in right finger(s): Secondary | ICD-10-CM | POA: Diagnosis not present

## 2020-07-14 DIAGNOSIS — R29898 Other symptoms and signs involving the musculoskeletal system: Secondary | ICD-10-CM | POA: Diagnosis not present

## 2020-07-14 DIAGNOSIS — S5411XD Injury of median nerve at forearm level, right arm, subsequent encounter: Secondary | ICD-10-CM | POA: Diagnosis not present

## 2020-07-14 DIAGNOSIS — G5601 Carpal tunnel syndrome, right upper limb: Secondary | ICD-10-CM | POA: Diagnosis not present

## 2020-07-15 DIAGNOSIS — G5611 Other lesions of median nerve, right upper limb: Secondary | ICD-10-CM | POA: Diagnosis not present

## 2020-07-18 ENCOUNTER — Other Ambulatory Visit: Payer: Self-pay

## 2020-07-18 ENCOUNTER — Ambulatory Visit (INDEPENDENT_AMBULATORY_CARE_PROVIDER_SITE_OTHER): Payer: Medicaid Other | Admitting: Family Medicine

## 2020-07-18 VITALS — BP 120/80 | HR 78 | Temp 97.5°F | Ht 61.0 in | Wt 212.0 lb

## 2020-07-18 DIAGNOSIS — G5611 Other lesions of median nerve, right upper limb: Secondary | ICD-10-CM

## 2020-07-18 MED ORDER — PREGABALIN 50 MG PO CAPS: 50.0000 mg | ORAL_CAPSULE | Freq: Three times a day (TID) | ORAL | 0 refills | Status: DC | PRN

## 2020-07-18 NOTE — Progress Notes (Signed)
Subjective:    Patient ID: Amy Aguilar, female    DOB: 1975-12-23, 44 y.o.   MRN: [id number]  HPI  07/11/20 Patient is a very pleasant 44 year old Hispanic female who presents today complaining of pain in her right arm.  She states that the problems began in 2017.  She started a job and developed symptoms of carpal tunnel syndrome.  She originally had surgery on her right wrist for the carpal tunnel syndrome however after the surgery she lost all the feeling in the distribution of the median nerve specifically in her thumb and index finger.  She ultimately saw Dr. Leanora Cover who performed a surgical revision.  His operative note reports repairing 3 fascicles of the median nerve.  However after the second surgery she continued to have symptoms of nerve palsy in her hand including numbness and tingling and burning pain in the distribution of the median nerve.  She was referred to a specialist at [facility]  They are planning a 3rd nerve conduction study next week.  She continues to have numbness and tingling in her right hand distal to the wrist in the distribution of the median nerve however she is now also having paresthesias and burning pain radiating up her right arm into her right shoulder and up the right side of her neck.  She reports stinging pain shooting up and down her right arm.  The pain is severe.  She is tearful when she describes it.  She states that she is depressed because her situation is not getting better and now she is having trouble sleeping due to the pain and she is unable to work.  At that time, my plan was: Patient has chronic right median neuropathy.  I am not sure if there was nerve injury after her first surgery or the exact cause however this has been persistent despite having two surgeries.  She has been referred to a third specialist and has a nerve conduction study pending.  However she is now having neuropathic pain radiating up and down her right arm and into  her right neck.  While this can still potentially be due to neurolysis of the right median nerve, it is possible she may also have an element of cervical radiculopathy.  She would like to try a prednisone taper pack in case she does have cervical radiculopathy.  She is tried and failed gabapentin in the past.  Recheck in 1 week after her nerve conduction studies to discuss further.  If nerve conduction studies confirm chronic right median neuropathy, we could try Lyrica versus nortriptyline for neuropathic pain in the right extremity.  07/18/20  Patient states that she had a nerve conduction test performed at her orthopedist office at [facility].  She was told that she has a neuroma causing pain radiating along the median nerve.  She was told that there was no evidence of cervical radiculopathy.  She has an appointment to see the hand specialist to discuss treatment options in October.  She is here today to discuss medication options to help manage neuropathic pain in the median nerve.  Past Medical History:  Diagnosis Date  . Abnormal uterine bleeding (AUB)   . Anemia    received blood transfusion 2 wks ago  . Blood transfusion without reported diagnosis   . Headache    migraines  . Leiomyoma of uterus   . Uterine fibroid    Past Surgical History:  Procedure Laterality Date  . ABDOMINAL HYSTERECTOMY    . BILATERAL  SALPINGECTOMY Bilateral 12/05/2014   Procedure: BILATERAL SALPINGECTOMY;  Surgeon: Margarette Asal, MD;  Location: [facility];  Service: Gynecology;  Laterality: Bilateral;  . CARPAL TUNNEL RELEASE Right 06/28/2018   Procedure: RIGHT CARPAL TUNNEL RELEASE;  Surgeon: Leandrew Koyanagi, MD;  Location: [place];  Service: Orthopedics;  Laterality: Right;  . LAPAROSCOPIC ASSISTED VAGINAL HYSTERECTOMY N/A 12/05/2014   Procedure: LAPAROSCOPY, ATTEMPTED VAGINAL HYSTERECTOMY, TOTAL ABDOMINAL HYSTERECTOMY;  Surgeon: Margarette Asal, MD;  Location: [place];  Service: Gynecology;  Laterality: N/A;  . NERVE REPAIR Right 03/08/2019   Procedure: EXPLORATION MEDIAN NERVE,  NERVE REPAIR WITH WRAP;  Surgeon: Leanora Cover, MD;  Location: [place];  Service: Orthopedics;  Laterality: Right;   Current Outpatient Medications on File Prior to Visit  Medication Sig Dispense Refill  . predniSONE (DELTASONE) 20 MG tablet 3 tabs poqday 1-2, 2 tabs poqday 3-4, 1 tab poqday 5-6 12 tablet 0   No current facility-administered medications on file prior to visit.   No Known Allergies Social History   Socioeconomic History  . Marital status: Divorced    Spouse name: Not on file  . Number of children: Not on file  . Years of education: Not on file  . Highest education level: Not on file  Occupational History  . Not on file  Tobacco Use  . Smoking status: Never Smoker  . Smokeless tobacco: Never Used  Vaping Use  . Vaping Use: Never used  Substance and Sexual Activity  . Alcohol use: No  . Drug use: No  . Sexual activity: Yes    Birth control/protection: Surgical    Comment: hysterectomy  Other Topics Concern  . Not on file  Social History Narrative   ** Merged History Encounter **       Marital status: married x 17 years; happily married.  From [place]; moved to [place] 2000. Children: 5 children; no grandchildren.  Living: husband, 5 children. Employment: homemaker.   Social Determinants of Health   Financial Resource Strain:   . Difficulty of Paying Living Expenses: Not on file  Food Insecurity:   . Worried About Charity fundraiser in the Last Year: Not on file  . Ran Out of Food in the Last Year: Not on file  Transportation Needs:   . Lack of Transportation (Medical): Not on file  . Lack of Transportation (Non-Medical): Not on file  Physical Activity:   . Days of Exercise per Week: Not on file  . Minutes of Exercise per Session: Not on file  Stress:   . Feeling of Stress : Not on file  Social Connections:    . Frequency of Communication with Friends and Family: Not on file  . Frequency of Social Gatherings with Friends and Family: Not on file  . Attends Religious Services: Not on file  . Active Member of Clubs or Organizations: Not on file  . Attends Archivist Meetings: Not on file  . Marital Status: Not on file  Intimate Partner Violence:   . Fear of Current or Ex-Partner: Not on file  . Emotionally Abused: Not on file  . Physically Abused: Not on file  . Sexually Abused: Not on file      Review of Systems  All other systems reviewed and are negative.      Objective:   Physical Exam Constitutional:      Appearance: Normal appearance. She is normal weight.  Cardiovascular:  Rate and Rhythm: Normal rate and regular rhythm.     Heart sounds: Normal heart sounds.  Pulmonary:     Effort: Pulmonary effort is normal.     Breath sounds: Normal breath sounds.  Musculoskeletal:     Right wrist: Swelling and tenderness present. Decreased range of motion.     Right hand: Tenderness present. Decreased range of motion. Decreased strength. Decreased sensation of the median distribution. There is disruption of two-point discrimination.       Hands:  Neurological:     Mental Status: She is alert.           Assessment & Plan:  Right median nerve neuropathy  Patient has tried and failed gabapentin.  Therefore we will try Lyrica 50 mg p.o. 3 times daily.  We will increase the dose further if the patient sees benefit and requires a higher dose.  Reassess via telephone in 1 week.  Await surgical consultation at [facility]

## 2020-07-21 DIAGNOSIS — S5411XD Injury of median nerve at forearm level, right arm, subsequent encounter: Secondary | ICD-10-CM | POA: Diagnosis not present

## 2020-07-21 DIAGNOSIS — G5601 Carpal tunnel syndrome, right upper limb: Secondary | ICD-10-CM | POA: Diagnosis not present

## 2020-07-21 DIAGNOSIS — M25531 Pain in right wrist: Secondary | ICD-10-CM | POA: Diagnosis not present

## 2020-07-21 DIAGNOSIS — R29898 Other symptoms and signs involving the musculoskeletal system: Secondary | ICD-10-CM | POA: Diagnosis not present

## 2020-08-11 DIAGNOSIS — M7541 Impingement syndrome of right shoulder: Secondary | ICD-10-CM | POA: Diagnosis not present

## 2020-08-11 DIAGNOSIS — S5411XD Injury of median nerve at forearm level, right arm, subsequent encounter: Secondary | ICD-10-CM | POA: Diagnosis not present

## 2020-08-11 DIAGNOSIS — G5603 Carpal tunnel syndrome, bilateral upper limbs: Secondary | ICD-10-CM | POA: Diagnosis not present

## 2020-09-11 ENCOUNTER — Ambulatory Visit (INDEPENDENT_AMBULATORY_CARE_PROVIDER_SITE_OTHER): Payer: Medicaid Other | Admitting: Family Medicine

## 2020-09-11 ENCOUNTER — Other Ambulatory Visit: Payer: Self-pay

## 2020-09-11 VITALS — BP 124/80 | HR 77 | Temp 97.5°F | Ht 61.0 in | Wt 212.0 lb

## 2020-09-11 DIAGNOSIS — G4486 Cervicogenic headache: Secondary | ICD-10-CM | POA: Diagnosis not present

## 2020-09-11 MED ORDER — PREGABALIN 75 MG PO CAPS: 75.0000 mg | ORAL_CAPSULE | Freq: Three times a day (TID) | ORAL | 0 refills | Status: DC | PRN

## 2020-09-11 NOTE — Progress Notes (Signed)
Subjective:    Patient ID: Amy Aguilar, female    DOB: 26-Jun-1976, 44 y.o.   MRN: [id number]  HPI  07/11/20 Patient is a very pleasant 44 year old Hispanic female who presents today complaining of pain in her right arm.  She states that the problems began in 2017.  She started a job and developed symptoms of carpal tunnel syndrome.  She originally had surgery on her right wrist for the carpal tunnel syndrome however after the surgery she lost all the feeling in the distribution of the median nerve specifically in her thumb and index finger.  She ultimately saw Dr. Leanora Cover who performed a surgical revision.  His operative note reports repairing 3 fascicles of the median nerve.  However after the second surgery she continued to have symptoms of nerve palsy in her hand including numbness and tingling and burning pain in the distribution of the median nerve.  She was referred to a specialist at [facility].  They are planning a 3rd nerve conduction study next week.  She continues to have numbness and tingling in her right hand distal to the wrist in the distribution of the median nerve however she is now also having paresthesias and burning pain radiating up her right arm into her right shoulder and up the right side of her neck.  She reports stinging pain shooting up and down her right arm.  The pain is severe.  She is tearful when she describes it.  She states that she is depressed because her situation is not getting better and now she is having trouble sleeping due to the pain and she is unable to work.  At that time, my plan was: Patient has chronic right median neuropathy.  I am not sure if there was nerve injury after her first surgery or the exact cause however this has been persistent despite having two surgeries.  She has been referred to a third specialist and has a nerve conduction study pending.  However she is now having neuropathic pain radiating up and down her right arm and into  her right neck.  While this can still potentially be due to neurolysis of the right median nerve, it is possible she may also have an element of cervical radiculopathy.  She would like to try a prednisone taper pack in case she does have cervical radiculopathy.  She is tried and failed gabapentin in the past.  Recheck in 1 week after her nerve conduction studies to discuss further.  If nerve conduction studies confirm chronic right median neuropathy, we could try Lyrica versus nortriptyline for neuropathic pain in the right extremity.  07/18/20  Patient states that she had a nerve conduction test performed at her orthopedist office at [facility].  She was told that she has a neuroma causing pain radiating along the median nerve.  She was told that there was no evidence of cervical radiculopathy.  She has an appointment to see the hand specialist to discuss treatment options in October.  She is here today to discuss medication options to help manage neuropathic pain in the median nerve.  At that time, my plan was: Patient has tried and failed gabapentin.  Therefore we will try Lyrica 50 mg p.o. 3 times daily.  We will increase the dose further if the patient sees benefit and requires a higher dose.  Reassess via telephone in 1 week.  Await surgical consultation at [facility].  09/11/20 Patient states that she saw benefit from the Lyrica 50 mg 3 times  a day.  It helped dull the pain in her right arm.  However when the medication ran out, she did not call back for refill because she did not realize that she could.  She has since seen the doctor at [facility].  Per the patient's report, she had an MRI that showed a slipped disc in her neck and they tried an epidural steroid injection in her neck to help with the pain radiating down her arm.  She states that the shot did not help.  She now reports severe right-sided headache.  She states the pain begins in the right side of her neck and then radiates up her right occiput  into her right temple and above her right eye.  She describes it as a pulsing sharp severe pain.  She states there is photophobia and nausea associated with it.  The pain comes and goes will.  Nothing seems to trigger the pain.  The only thing that she has found that makes the pain better is Excedrin Migraine.  However she denies any phonophobia.  She denies any aura or vision changes.  She denies any blurry vision.  She denies any dizziness.  Differential diagnosis includes cervicogenic headache due to cervical radiculopathy, chronic tension headache, or migraines unrelated to the nerve pain on the right side of her body.  However the patient states that the pain seems to radiate up and down her right arm into her right neck and up into her right temple.  It seems to be all tied together and is nervelike in nature  Past Medical History:  Diagnosis Date  . Abnormal uterine bleeding (AUB)   . Anemia    received blood transfusion 2 wks ago  . Blood transfusion without reported diagnosis   . Headache    migraines  . Leiomyoma of uterus   . Uterine fibroid    Past Surgical History:  Procedure Laterality Date  . ABDOMINAL HYSTERECTOMY    . BILATERAL SALPINGECTOMY Bilateral 12/05/2014   Procedure: BILATERAL SALPINGECTOMY;  Surgeon: Margarette Asal, MD;  Location: [facility];  Service: Gynecology;  Laterality: Bilateral;  . CARPAL TUNNEL RELEASE Right 06/28/2018   Procedure: RIGHT CARPAL TUNNEL RELEASE;  Surgeon: Leandrew Koyanagi, MD;  Location: [place];  Service: Orthopedics;  Laterality: Right;  . LAPAROSCOPIC ASSISTED VAGINAL HYSTERECTOMY N/A 12/05/2014   Procedure: LAPAROSCOPY, ATTEMPTED VAGINAL HYSTERECTOMY, TOTAL ABDOMINAL HYSTERECTOMY;  Surgeon: Margarette Asal, MD;  Location: [place];  Service: Gynecology;  Laterality: N/A;  . NERVE REPAIR Right 03/08/2019   Procedure: EXPLORATION MEDIAN NERVE,  NERVE REPAIR WITH WRAP;  Surgeon: Leanora Cover,  MD;  Location: [place];  Service: Orthopedics;  Laterality: Right;   No current outpatient medications on file prior to visit.   No current facility-administered medications on file prior to visit.   No Known Allergies Social History   Socioeconomic History  . Marital status: Divorced    Spouse name: Not on file  . Number of children: Not on file  . Years of education: Not on file  . Highest education level: Not on file  Occupational History  . Not on file  Tobacco Use  . Smoking status: Never Smoker  . Smokeless tobacco: Never Used  Vaping Use  . Vaping Use: Never used  Substance and Sexual Activity  . Alcohol use: No  . Drug use: No  . Sexual activity: Yes    Birth control/protection: Surgical    Comment: hysterectomy  Other Topics Concern  . Not on file  Social History Narrative   ** Merged History Encounter **       Marital status: married x 17 years; happily married.  From [place]; moved to [place] 2000. Children: 5 children; no grandchildren.  Living: husband, 5 children. Employment: homemaker.   Social Determinants of Health   Financial Resource Strain:   . Difficulty of Paying Living Expenses: Not on file  Food Insecurity:   . Worried About Charity fundraiser in the Last Year: Not on file  . Ran Out of Food in the Last Year: Not on file  Transportation Needs:   . Lack of Transportation (Medical): Not on file  . Lack of Transportation (Non-Medical): Not on file  Physical Activity:   . Days of Exercise per Week: Not on file  . Minutes of Exercise per Session: Not on file  Stress:   . Feeling of Stress : Not on file  Social Connections:   . Frequency of Communication with Friends and Family: Not on file  . Frequency of Social Gatherings with Friends and Family: Not on file  . Attends Religious Services: Not on file  . Active Member of Clubs or Organizations: Not on file  . Attends Archivist Meetings: Not on file  . Marital  Status: Not on file  Intimate Partner Violence:   . Fear of Current or Ex-Partner: Not on file  . Emotionally Abused: Not on file  . Physically Abused: Not on file  . Sexually Abused: Not on file      Review of Systems  Neurological: Positive for headaches.  All other systems reviewed and are negative.      Objective:   Physical Exam Constitutional:      Appearance: Normal appearance. She is normal weight.  HENT:     Head:   Cardiovascular:     Rate and Rhythm: Normal rate and regular rhythm.     Heart sounds: Normal heart sounds.  Pulmonary:     Effort: Pulmonary effort is normal.     Breath sounds: Normal breath sounds.  Musculoskeletal:     Right wrist: Swelling and tenderness present. Decreased range of motion.     Right hand: Tenderness present. Decreased range of motion. Decreased strength. Decreased sensation of the median distribution. There is disruption of two-point discrimination.       Arms:       Hands:  Neurological:     Mental Status: She is alert.           Assessment & Plan:  Headache, cervicogenic  At this point I am not certain of what is going on but the patient certainly seems to have neuropathic pain radiating down her right arm into her right hand.  Throughout her entire encounter today she is shaking her right hand like she is trying to wake it up.  She also reports nervelike pain radiating up her right neck into her right occiput and over her right temple.  It seems to be all tied together.  She has had MRIs of her neck and her head at [facility] per her report.  Therefore I have asked her to sign a release of information form so that I can get all the studies done at [facility] to see what nerve conduction studies and what MRIs have been performed.  Meanwhile I will put her back on Lyrica but at a higher dose to treat neuropathic pain 75 mg p.o. 3 times daily.  And then reassess in 1 month.  Patient did have a normal MRI of the brain in May 2019.

## 2020-09-16 ENCOUNTER — Other Ambulatory Visit: Payer: Self-pay | Admitting: Family Medicine

## 2020-09-16 DIAGNOSIS — Z1231 Encounter for screening mammogram for malignant neoplasm of breast: Secondary | ICD-10-CM

## 2020-10-22 ENCOUNTER — Other Ambulatory Visit: Payer: Self-pay | Admitting: Family Medicine

## 2020-10-22 NOTE — Telephone Encounter (Signed)
Ok to refill??  Last office visit/ refill 09/11/2020.

## 2020-11-03 ENCOUNTER — Ambulatory Visit: Payer: Medicaid Other

## 2020-11-13 ENCOUNTER — Telehealth: Payer: Self-pay | Admitting: Orthopaedic Surgery

## 2020-11-13 NOTE — Telephone Encounter (Signed)
Received medical records request from patient ?

## 2020-11-19 DIAGNOSIS — M7581 Other shoulder lesions, right shoulder: Secondary | ICD-10-CM | POA: Insufficient documentation

## 2020-11-19 DIAGNOSIS — S5411XD Injury of median nerve at forearm level, right arm, subsequent encounter: Secondary | ICD-10-CM | POA: Diagnosis not present

## 2020-12-01 DIAGNOSIS — M7581 Other shoulder lesions, right shoulder: Secondary | ICD-10-CM | POA: Diagnosis not present

## 2020-12-01 DIAGNOSIS — M25611 Stiffness of right shoulder, not elsewhere classified: Secondary | ICD-10-CM | POA: Diagnosis not present

## 2020-12-01 DIAGNOSIS — R29898 Other symptoms and signs involving the musculoskeletal system: Secondary | ICD-10-CM | POA: Diagnosis not present

## 2020-12-01 DIAGNOSIS — S5411XD Injury of median nerve at forearm level, right arm, subsequent encounter: Secondary | ICD-10-CM | POA: Diagnosis not present

## 2020-12-03 DIAGNOSIS — M7581 Other shoulder lesions, right shoulder: Secondary | ICD-10-CM | POA: Diagnosis not present

## 2020-12-03 DIAGNOSIS — M25611 Stiffness of right shoulder, not elsewhere classified: Secondary | ICD-10-CM | POA: Diagnosis not present

## 2020-12-03 DIAGNOSIS — R29898 Other symptoms and signs involving the musculoskeletal system: Secondary | ICD-10-CM | POA: Diagnosis not present

## 2020-12-15 ENCOUNTER — Other Ambulatory Visit: Payer: Self-pay

## 2020-12-15 ENCOUNTER — Ambulatory Visit
Admission: RE | Admit: 2020-12-15 | Discharge: 2020-12-15 | Disposition: A | Discharge status: Home / Self Care | Payer: Medicaid Other | Source: Ambulatory Visit | Attending: Family Medicine | Admitting: Family Medicine

## 2020-12-15 DIAGNOSIS — Z1231 Encounter for screening mammogram for malignant neoplasm of breast: Secondary | ICD-10-CM

## 2020-12-29 DIAGNOSIS — R29898 Other symptoms and signs involving the musculoskeletal system: Secondary | ICD-10-CM | POA: Diagnosis not present

## 2020-12-29 DIAGNOSIS — M7581 Other shoulder lesions, right shoulder: Secondary | ICD-10-CM | POA: Diagnosis not present

## 2020-12-29 DIAGNOSIS — S5411XD Injury of median nerve at forearm level, right arm, subsequent encounter: Secondary | ICD-10-CM | POA: Diagnosis not present

## 2020-12-29 DIAGNOSIS — M25611 Stiffness of right shoulder, not elsewhere classified: Secondary | ICD-10-CM | POA: Diagnosis not present

## 2020-12-31 DIAGNOSIS — M25611 Stiffness of right shoulder, not elsewhere classified: Secondary | ICD-10-CM | POA: Diagnosis not present

## 2020-12-31 DIAGNOSIS — R29898 Other symptoms and signs involving the musculoskeletal system: Secondary | ICD-10-CM | POA: Diagnosis not present

## 2020-12-31 DIAGNOSIS — M7581 Other shoulder lesions, right shoulder: Secondary | ICD-10-CM | POA: Diagnosis not present

## 2021-01-22 ENCOUNTER — Ambulatory Visit (INDEPENDENT_AMBULATORY_CARE_PROVIDER_SITE_OTHER): Payer: Medicare Other | Admitting: Family Medicine

## 2021-01-22 ENCOUNTER — Encounter: Payer: Self-pay | Admitting: Family Medicine

## 2021-01-22 ENCOUNTER — Other Ambulatory Visit: Payer: Self-pay

## 2021-01-22 VITALS — BP 124/82 | HR 86 | Temp 98.3°F | Resp 14 | Ht 61.0 in | Wt 215.0 lb

## 2021-01-22 DIAGNOSIS — M5431 Sciatica, right side: Secondary | ICD-10-CM | POA: Diagnosis not present

## 2021-01-22 DIAGNOSIS — R5382 Chronic fatigue, unspecified: Secondary | ICD-10-CM

## 2021-01-22 LAB — CBC WITH DIFFERENTIAL/PLATELET
Absolute Monocytes: 262 cells/uL (ref 200–950)
Basophils Absolute: 38 cells/uL (ref 0–200)
Basophils Relative: 0.6 %
Eosinophils Absolute: 122 cells/uL (ref 15–500)
Eosinophils Relative: 1.9 %
HCT: 41.6 % (ref 35.0–45.0)
Hemoglobin: 13.6 g/dL (ref 11.7–15.5)
Lymphs Abs: 2656 cells/uL (ref 850–3900)
MCH: 29.2 pg (ref 27.0–33.0)
MCHC: 32.7 g/dL (ref 32.0–36.0)
MCV: 89.5 fL (ref 80.0–100.0)
MPV: 10.9 fL (ref 7.5–12.5)
Monocytes Relative: 4.1 %
Neutro Abs: 3322 cells/uL (ref 1500–7800)
Neutrophils Relative %: 51.9 %
Platelets: 276 10*3/uL (ref 140–400)
RBC: 4.65 10*6/uL (ref 3.80–5.10)
RDW: 13.1 % (ref 11.0–15.0)
Total Lymphocyte: 41.5 %
WBC: 6.4 10*3/uL (ref 3.8–10.8)

## 2021-01-22 LAB — VITAMIN B12: Vitamin B-12: 427 pg/mL (ref 200–1100)

## 2021-01-22 LAB — COMPLETE METABOLIC PANEL WITH GFR
AG Ratio: 1.6 (calc) (ref 1.0–2.5)
ALT: 61 U/L — ABNORMAL HIGH (ref 6–29)
AST: 39 U/L — ABNORMAL HIGH (ref 10–30)
Albumin: 4.4 g/dL (ref 3.6–5.1)
Alkaline phosphatase (APISO): 114 U/L (ref 31–125)
BUN: 10 mg/dL (ref 7–25)
CO2: 24 mmol/L (ref 20–32)
Calcium: 8.8 mg/dL (ref 8.6–10.2)
Chloride: 103 mmol/L (ref 98–110)
Creat: 0.52 mg/dL (ref 0.50–1.10)
GFR, Est African American: 136 mL/min/{1.73_m2} (ref >=60)
GFR, Est Non African American: 117 mL/min/{1.73_m2} (ref >=60)
Globulin: 2.7 g/dL (calc) (ref 1.9–3.7)
Glucose, Bld: 111 mg/dL — ABNORMAL HIGH (ref 65–99)
Potassium: 4.2 mmol/L (ref 3.5–5.3)
Sodium: 138 mmol/L (ref 135–146)
Total Bilirubin: 0.9 mg/dL (ref 0.2–1.2)
Total Protein: 7.1 g/dL (ref 6.1–8.1)

## 2021-01-22 LAB — TSH: TSH: 1.6 mIU/L

## 2021-01-22 MED ORDER — MELOXICAM 15 MG PO TABS: 15.0000 mg | ORAL_TABLET | Freq: Every day | ORAL | 0 refills | Status: DC

## 2021-01-22 NOTE — Progress Notes (Signed)
Subjective:    Patient ID: Amy Aguilar, female    DOB: April 28, 1976, 45 y.o.   MRN: [id number]  Patient is a very pleasant 45 year old Hispanic female who presents today with several concerns.  First she has been having an unusual paresthesia and pain radiating down her right leg.  It starts in her right gluteal area and then radiates down the right leg all the way to her right feet.  She reports burning and stinging and pins-and-needles.  She reports heaviness in the legs.  She denies any back pain however.  She states the pain is been off and on present for 2 months.  She also reports trouble sleeping.  Her daughter states that she snores.  She states that she feels very tired all day long.  She can fall asleep easily.  She has chronic fatigue.  Past Medical History:  Diagnosis Date  . Abnormal uterine bleeding (AUB)   . Anemia    received blood transfusion 2 wks ago  . Blood transfusion without reported diagnosis   . Headache    migraines  . Leiomyoma of uterus   . Uterine fibroid    Past Surgical History:  Procedure Laterality Date  . ABDOMINAL HYSTERECTOMY    . BILATERAL SALPINGECTOMY Bilateral 12/05/2014   Procedure: BILATERAL SALPINGECTOMY;  Surgeon: Margarette Asal, MD;  Location: [facility];  Service: Gynecology;  Laterality: Bilateral;  . CARPAL TUNNEL RELEASE Right 06/28/2018   Procedure: RIGHT CARPAL TUNNEL RELEASE;  Surgeon: Leandrew Koyanagi, MD;  Location: [place];  Service: Orthopedics;  Laterality: Right;  . LAPAROSCOPIC ASSISTED VAGINAL HYSTERECTOMY N/A 12/05/2014   Procedure: LAPAROSCOPY, ATTEMPTED VAGINAL HYSTERECTOMY, TOTAL ABDOMINAL HYSTERECTOMY;  Surgeon: Margarette Asal, MD;  Location: [place];  Service: Gynecology;  Laterality: N/A;  . NERVE REPAIR Right 03/08/2019   Procedure: EXPLORATION MEDIAN NERVE,  NERVE REPAIR WITH WRAP;  Surgeon: Leanora Cover, MD;  Location: [place];  Service:  Orthopedics;  Laterality: Right;   Current Outpatient Medications on File Prior to Visit  Medication Sig Dispense Refill  . pregabalin (LYRICA) 75 MG capsule TAKE 1 CAPSULE (75 MG TOTAL) BY MOUTH 3 (THREE) TIMES DAILY AS NEEDED (NERVE PAIN). (Patient not taking: Reported on 01/22/2021) 90 capsule 0   No current facility-administered medications on file prior to visit.   No Known Allergies Social History   Socioeconomic History  . Marital status: Divorced    Spouse name: Not on file  . Number of children: Not on file  . Years of education: Not on file  . Highest education level: Not on file  Occupational History  . Not on file  Tobacco Use  . Smoking status: Never Smoker  . Smokeless tobacco: Never Used  Vaping Use  . Vaping Use: Never used  Substance and Sexual Activity  . Alcohol use: No  . Drug use: No  . Sexual activity: Yes    Birth control/protection: Surgical    Comment: hysterectomy  Other Topics Concern  . Not on file  Social History Narrative   ** Merged History Encounter **       Marital status: married x 17 years; happily married.  From [place]; moved to [place] 2000. Children: 5 children; no grandchildren.  Living: husband, 5 children. Employment: homemaker.   Social Determinants of Health   Financial Resource Strain: Not on file  Food Insecurity: Not on file  Transportation Needs: Not on file  Physical Activity: Not on file  Stress:  Not on file  Social Connections: Not on file  Intimate Partner Violence: Not on file      Review of Systems  Neurological: Positive for headaches.  All other systems reviewed and are negative.      Objective:   Physical Exam Constitutional:      Appearance: Normal appearance. She is normal weight.  HENT:     Head:   Cardiovascular:     Rate and Rhythm: Normal rate and regular rhythm.     Heart sounds: No murmur heard. No friction rub. No gallop.   Pulmonary:     Effort: Pulmonary effort is normal. No  respiratory distress.     Breath sounds: Normal breath sounds. No stridor. No wheezing or rhonchi.  Abdominal:     General: Abdomen is flat. Bowel sounds are normal.     Palpations: Abdomen is soft.  Musculoskeletal:     Right hand: Tenderness present. Decreased range of motion. Decreased strength. Decreased sensation of the median distribution. There is disruption of two-point discrimination.     Right lower leg: Edema present.     Left lower leg: Edema present.  Neurological:     General: No focal deficit present.     Mental Status: She is alert and oriented to person, place, and time. Mental status is at baseline.     Cranial Nerves: No cranial nerve deficit.     Motor: No weakness.     Coordination: Coordination normal.     Gait: Gait normal.     Deep Tendon Reflexes: Reflexes normal.           Assessment & Plan:  Chronic fatigue - Plan: CBC with Differential/Platelet, COMPLETE METABOLIC PANEL WITH GFR, Vitamin B12, TSH  Right sided sciatica  I believe the patient is experiencing right-sided sciatica although the history is somewhat difficult due to language differences.  Begin meloxicam 15 mg a day and then reassess in 2 weeks.  If paresthesias and pain persist on the right leg I would recommend an x-ray of the lumbar spine at that time.  I will check lab work due to her chronic fatigue including a CBC, CMP, B12, TSH.  However if this is all normal, I suspect it may be due to obstructive sleep apnea.  Patient will consider a sleep study

## 2021-01-26 ENCOUNTER — Other Ambulatory Visit: Payer: Self-pay

## 2021-01-26 ENCOUNTER — Other Ambulatory Visit: Payer: Self-pay | Admitting: *Deleted

## 2021-01-26 ENCOUNTER — Other Ambulatory Visit: Payer: Medicare Other

## 2021-01-26 DIAGNOSIS — R7989 Other specified abnormal findings of blood chemistry: Secondary | ICD-10-CM

## 2021-01-27 LAB — HEPATITIS PANEL, ACUTE
Hep A IgM: NONREACTIVE
Hep B C IgM: NONREACTIVE
Hepatitis B Surface Ag: NONREACTIVE
Hepatitis C Ab: NONREACTIVE
SIGNAL TO CUT-OFF: 0.01 (ref <1.00)

## 2021-02-19 ENCOUNTER — Other Ambulatory Visit: Payer: Self-pay | Admitting: Family Medicine

## 2021-03-31 ENCOUNTER — Other Ambulatory Visit: Payer: Self-pay | Admitting: Family Medicine

## 2021-05-14 ENCOUNTER — Encounter: Payer: Self-pay | Admitting: Family Medicine

## 2021-05-14 ENCOUNTER — Other Ambulatory Visit: Payer: Self-pay

## 2021-05-14 ENCOUNTER — Ambulatory Visit (INDEPENDENT_AMBULATORY_CARE_PROVIDER_SITE_OTHER): Payer: Medicare Other | Admitting: Family Medicine

## 2021-05-14 VITALS — BP 124/62 | HR 74 | Temp 97.9°F | Resp 14 | Ht 61.0 in | Wt 210.0 lb

## 2021-05-14 DIAGNOSIS — R7309 Other abnormal glucose: Secondary | ICD-10-CM

## 2021-05-14 DIAGNOSIS — Z0000 Encounter for general adult medical examination without abnormal findings: Secondary | ICD-10-CM

## 2021-05-14 DIAGNOSIS — Z13220 Encounter for screening for lipoid disorders: Secondary | ICD-10-CM

## 2021-05-14 DIAGNOSIS — R7989 Other specified abnormal findings of blood chemistry: Secondary | ICD-10-CM | POA: Diagnosis not present

## 2021-05-14 MED ORDER — PHENTERMINE HCL 37.5 MG PO TABS: 37.5000 mg | ORAL_TABLET | Freq: Every day | ORAL | 2 refills | Status: DC

## 2021-05-14 NOTE — Progress Notes (Signed)
Subjective:    Patient ID: Amy Aguilar, female    DOB: 11-Dec-1975, 45 y.o.   MRN: [id number]  Patient is a very sweet 45 year old Hispanic female who is here today for a complete physical exam.  Her mammogram was performed in February and was normal.  She has a history of a hysterectomy due to a fibroid.  Therefore she does not require Pap smear.  She has not yet of an age to require colonoscopy.  In March we found that she was a borderline prediabetic and she also had elevated liver function test that we assume are due to fatty liver disease.  Since that time she has been trying to exercise.  She is been walking every day.  She is also been cutting the carbs out of her diet and trying to lose weight but she is been unsuccessful.  She is interested in medications that may help her lose weight.  However with the lifestyle changes and the exercise, she states that her headaches have gone away.  She is no longer taking the Lyrica for arm pain as this has improved with physical therapy Past Medical History:  Diagnosis Date   Abnormal uterine bleeding (AUB)    Anemia    received blood transfusion 2 wks ago   Blood transfusion without reported diagnosis    Headache    migraines   Leiomyoma of uterus    Uterine fibroid    Past Surgical History:  Procedure Laterality Date   ABDOMINAL HYSTERECTOMY     BILATERAL SALPINGECTOMY Bilateral 12/05/2014   Procedure: BILATERAL SALPINGECTOMY;  Surgeon: Margarette Asal, MD;  Location: [facility];  Service: Gynecology;  Laterality: Bilateral;   CARPAL TUNNEL RELEASE Right 06/28/2018   Procedure: RIGHT CARPAL TUNNEL RELEASE;  Surgeon: Leandrew Koyanagi, MD;  Location: [place];  Service: Orthopedics;  Laterality: Right;   LAPAROSCOPIC ASSISTED VAGINAL HYSTERECTOMY N/A 12/05/2014   Procedure: LAPAROSCOPY, ATTEMPTED VAGINAL HYSTERECTOMY, TOTAL ABDOMINAL HYSTERECTOMY;  Surgeon: Margarette Asal, MD;  Location: [place];  Service: Gynecology;  Laterality: N/A;   NERVE REPAIR Right 03/08/2019   Procedure: EXPLORATION MEDIAN NERVE,  NERVE REPAIR WITH WRAP;  Surgeon: Leanora Cover, MD;  Location: [place];  Service: Orthopedics;  Laterality: Right;   Current Outpatient Medications on File Prior to Visit  Medication Sig Dispense Refill   pregabalin (LYRICA) 75 MG capsule TAKE 1 CAPSULE (75 MG TOTAL) BY MOUTH 3 (THREE) TIMES DAILY AS NEEDED (NERVE PAIN). (Patient not taking: No sig reported) 90 capsule 0   No current facility-administered medications on file prior to visit.   No Known Allergies Social History   Socioeconomic History   Marital status: Divorced    Spouse name: Not on file   Number of children: Not on file   Years of education: Not on file   Highest education level: Not on file  Occupational History   Not on file  Tobacco Use   Smoking status: Never   Smokeless tobacco: Never  Vaping Use   Vaping Use: Never used  Substance and Sexual Activity   Alcohol use: No   Drug use: No   Sexual activity: Yes    Birth control/protection: Surgical    Comment: hysterectomy  Other Topics Concern   Not on file  Social History Narrative   ** Merged History Encounter **       Marital status: married x 17 years; happily married.  From [place]; moved to [place]  2000. Children: 5 children; no grandchildren.  Living: husband, 5 children. Employment: homemaker.   Social Determinants of Health   Financial Resource Strain: Not on file  Food Insecurity: Not on file  Transportation Needs: Not on file  Physical Activity: Not on file  Stress: Not on file  Social Connections: Not on file  Intimate Partner Violence: Not on file      Review of Systems  Neurological:  Positive for headaches.  All other systems reviewed and are negative.     Objective:   Physical Exam Constitutional:      General: She is not in acute distress.    Appearance: Normal appearance. She is  obese. She is not ill-appearing, toxic-appearing or diaphoretic.  HENT:     Head: Normocephalic and atraumatic.     Right Ear: Tympanic membrane, ear canal and external ear normal. There is no impacted cerumen.     Left Ear: Tympanic membrane, ear canal and external ear normal. There is no impacted cerumen.     Nose: Nose normal. No congestion or rhinorrhea.     Mouth/Throat:     Mouth: Mucous membranes are moist.     Pharynx: Oropharynx is clear. No oropharyngeal exudate or posterior oropharyngeal erythema.  Eyes:     General:        Right eye: No discharge.        Left eye: No discharge.     Extraocular Movements: Extraocular movements intact.     Conjunctiva/sclera: Conjunctivae normal.     Pupils: Pupils are equal, round, and reactive to light.  Neck:     Vascular: No carotid bruit.  Cardiovascular:     Rate and Rhythm: Normal rate and regular rhythm.     Pulses: Normal pulses.     Heart sounds: Normal heart sounds. No murmur heard.   No friction rub. No gallop.  Pulmonary:     Effort: Pulmonary effort is normal. No respiratory distress.     Breath sounds: Normal breath sounds. No stridor. No wheezing or rhonchi.  Abdominal:     General: Abdomen is flat. Bowel sounds are normal. There is no distension.     Palpations: Abdomen is soft. There is no mass.     Tenderness: There is no abdominal tenderness. There is no guarding or rebound.     Hernia: No hernia is present.  Musculoskeletal:     Right hand: Tenderness present. Decreased range of motion. Decreased strength. Decreased sensation of the median distribution. There is disruption of two-point discrimination.     Cervical back: Normal range of motion and neck supple.     Right lower leg: Edema present.     Left lower leg: Edema present.  Lymphadenopathy:     Cervical: No cervical adenopathy.  Skin:    General: Skin is warm and dry.     Coloration: Skin is not jaundiced or pale.     Findings: No bruising, erythema,  lesion or rash.  Neurological:     General: No focal deficit present.     Mental Status: She is alert and oriented to person, place, and time. Mental status is at baseline.     Cranial Nerves: No cranial nerve deficit.     Motor: No weakness.     Coordination: Coordination normal.     Gait: Gait normal.     Deep Tendon Reflexes: Reflexes normal.  Psychiatric:        Mood and Affect: Mood normal.  Behavior: Behavior normal.        Thought Content: Thought content normal.        Judgment: Judgment normal.          Assessment & Plan:  Elevated LFTs - Plan: CBC with Differential/Platelet, COMPLETE METABOLIC PANEL WITH GFR, Lipid panel, Hemoglobin A1c  Elevated glucose - Plan: CBC with Differential/Platelet, COMPLETE METABOLIC PANEL WITH GFR, Lipid panel, Hemoglobin A1c  Screening, lipid - Plan: CBC with Differential/Platelet, COMPLETE METABOLIC PANEL WITH GFR, Lipid panel, Hemoglobin A1c  General medical exam - Plan: CBC with Differential/Platelet, COMPLETE METABOLIC PANEL WITH GFR, Lipid panel, Hemoglobin A1c Recommended 1500 cal or less a day.  Recommended 30 minutes of aerobic exercise a day.  Recommended a low carbohydrate diet such as the Mediterranean diet.  Because of her elevated blood sugar I will check an A1c.  I believe her elevated LFTs are likely due to fatty liver disease so I will repeat a CMP.  Mammogram is up-to-date.  Does not require Pap smear.  Colonoscopy is not required yet.  Patient would like to start phentermine 37.5 mg p.o. daily for up to 90 days to try to assist in weight loss as she has had a roadblock just with lifestyle changes.

## 2021-07-02 ENCOUNTER — Other Ambulatory Visit: Payer: Self-pay

## 2021-07-02 ENCOUNTER — Other Ambulatory Visit: Payer: Medicare Other

## 2021-07-02 DIAGNOSIS — R7989 Other specified abnormal findings of blood chemistry: Secondary | ICD-10-CM

## 2021-07-02 DIAGNOSIS — Z136 Encounter for screening for cardiovascular disorders: Secondary | ICD-10-CM

## 2021-07-02 DIAGNOSIS — R7309 Other abnormal glucose: Secondary | ICD-10-CM

## 2021-07-02 DIAGNOSIS — Z13220 Encounter for screening for lipoid disorders: Secondary | ICD-10-CM

## 2021-07-03 LAB — COMPLETE METABOLIC PANEL WITH GFR
AG Ratio: 1.8 (calc) (ref 1.0–2.5)
ALT: 18 U/L (ref 6–29)
AST: 15 U/L (ref 10–30)
Albumin: 4.5 g/dL (ref 3.6–5.1)
Alkaline phosphatase (APISO): 117 U/L (ref 31–125)
BUN: 11 mg/dL (ref 7–25)
CO2: 25 mmol/L (ref 20–32)
Calcium: 8.9 mg/dL (ref 8.6–10.2)
Chloride: 105 mmol/L (ref 98–110)
Creat: 0.64 mg/dL (ref 0.50–0.99)
Globulin: 2.5 g/dL (calc) (ref 1.9–3.7)
Glucose, Bld: 96 mg/dL (ref 65–99)
Potassium: 4.1 mmol/L (ref 3.5–5.3)
Sodium: 138 mmol/L (ref 135–146)
Total Bilirubin: 0.6 mg/dL (ref 0.2–1.2)
Total Protein: 7 g/dL (ref 6.1–8.1)
eGFR: 112 mL/min/{1.73_m2} (ref >=60)

## 2021-07-03 LAB — LIPID PANEL
Cholesterol: 192 mg/dL (ref <200)
HDL: 43 mg/dL — ABNORMAL LOW (ref >=50)
LDL Cholesterol (Calc): 110 mg/dL (calc) — ABNORMAL HIGH
Non-HDL Cholesterol (Calc): 149 mg/dL (calc) — ABNORMAL HIGH (ref <130)
Total CHOL/HDL Ratio: 4.5 (calc) (ref <5.0)
Triglycerides: 294 mg/dL — ABNORMAL HIGH (ref <150)

## 2021-07-03 LAB — CBC WITH DIFFERENTIAL/PLATELET
Absolute Monocytes: 298 cells/uL (ref 200–950)
Basophils Absolute: 43 cells/uL (ref 0–200)
Basophils Relative: 0.5 %
Eosinophils Absolute: 111 cells/uL (ref 15–500)
Eosinophils Relative: 1.3 %
HCT: 42.7 % (ref 35.0–45.0)
Hemoglobin: 13.9 g/dL (ref 11.7–15.5)
Lymphs Abs: 2737 cells/uL (ref 850–3900)
MCH: 29 pg (ref 27.0–33.0)
MCHC: 32.6 g/dL (ref 32.0–36.0)
MCV: 89 fL (ref 80.0–100.0)
MPV: 11.8 fL (ref 7.5–12.5)
Monocytes Relative: 3.5 %
Neutro Abs: 5313 cells/uL (ref 1500–7800)
Neutrophils Relative %: 62.5 %
Platelets: 270 10*3/uL (ref 140–400)
RBC: 4.8 10*6/uL (ref 3.80–5.10)
RDW: 13.3 % (ref 11.0–15.0)
Total Lymphocyte: 32.2 %
WBC: 8.5 10*3/uL (ref 3.8–10.8)

## 2021-07-03 LAB — HEMOGLOBIN A1C
Hgb A1c MFr Bld: 5.5 % of total Hgb (ref <5.7)
Mean Plasma Glucose: 111 mg/dL
eAG (mmol/L): 6.2 mmol/L

## 2021-08-11 IMAGING — US US BREAST*R* LIMITED INC AXILLA
1 series · 6 of 6 positions shown · non-contrast
Comparison: Previous exam(s).

CLINICAL DATA: 43-year-old with a previously palpable lump in the
UPPER RIGHT breast associated with focal pain and tenderness. The
lump is less palpable currently.

EXAM:
DIGITAL DIAGNOSTIC RIGHT MAMMOGRAM WITH CAD AND TOMO
ULTRASOUND RIGHT BREAST

[Series 1: us breast*right* limited inc axilla · 0.07mm/px · 6 of 6 slices shown]
[im 1/6]
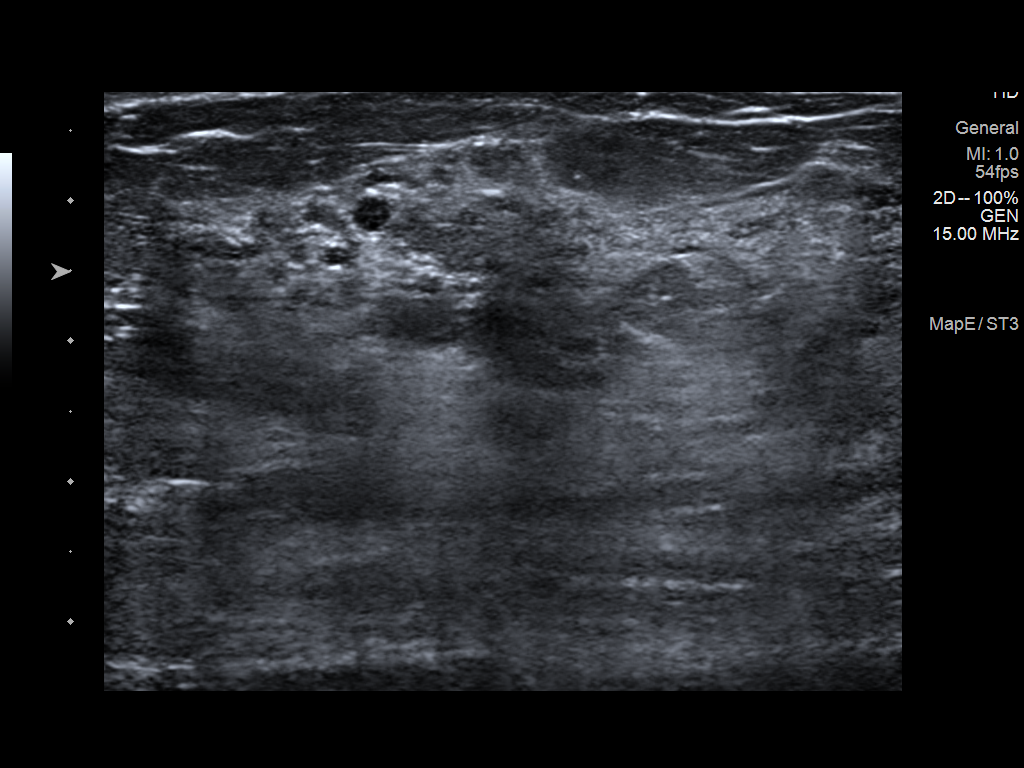
[im 2/6]
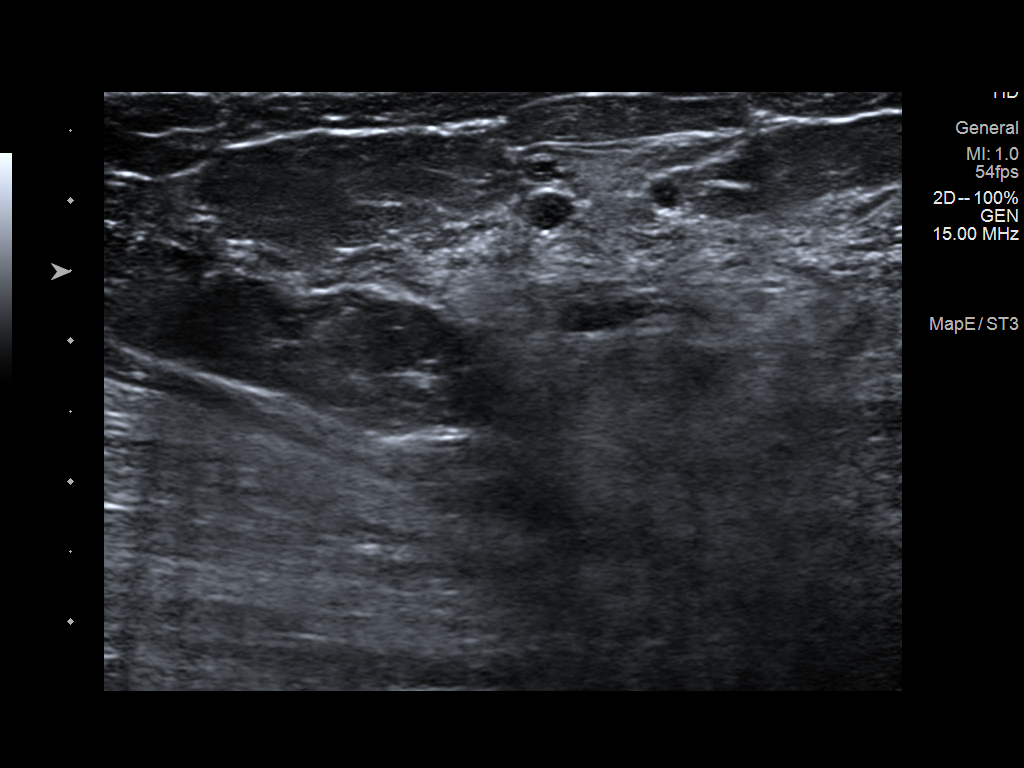
[im 3/6]
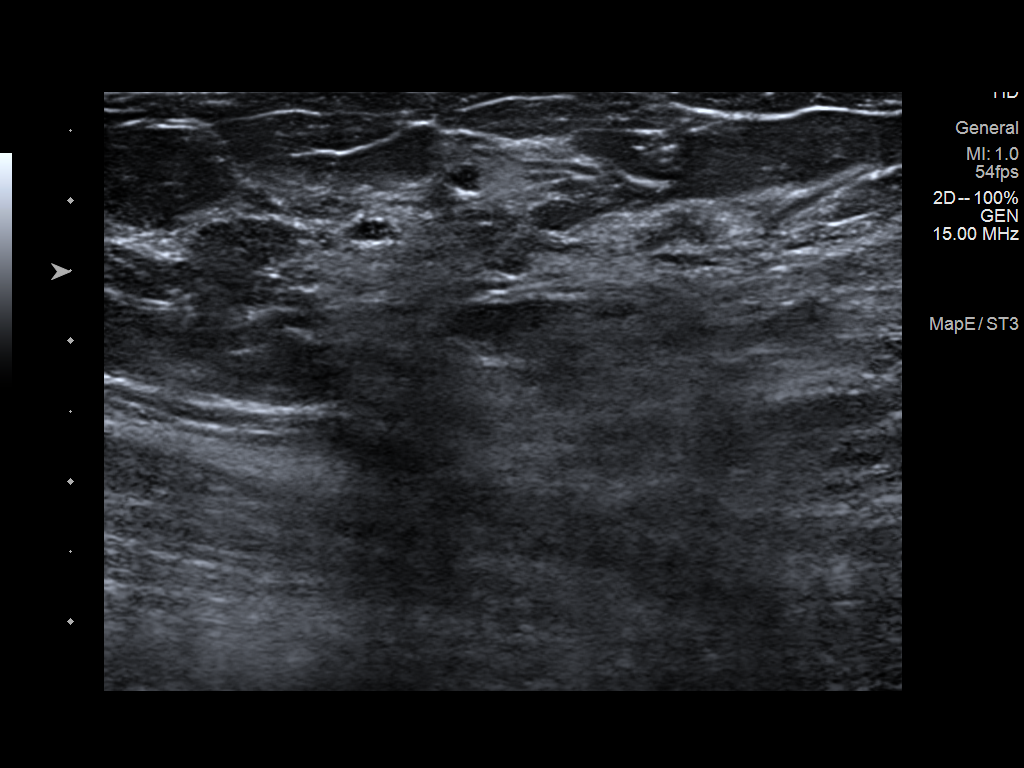
[im 4/6]
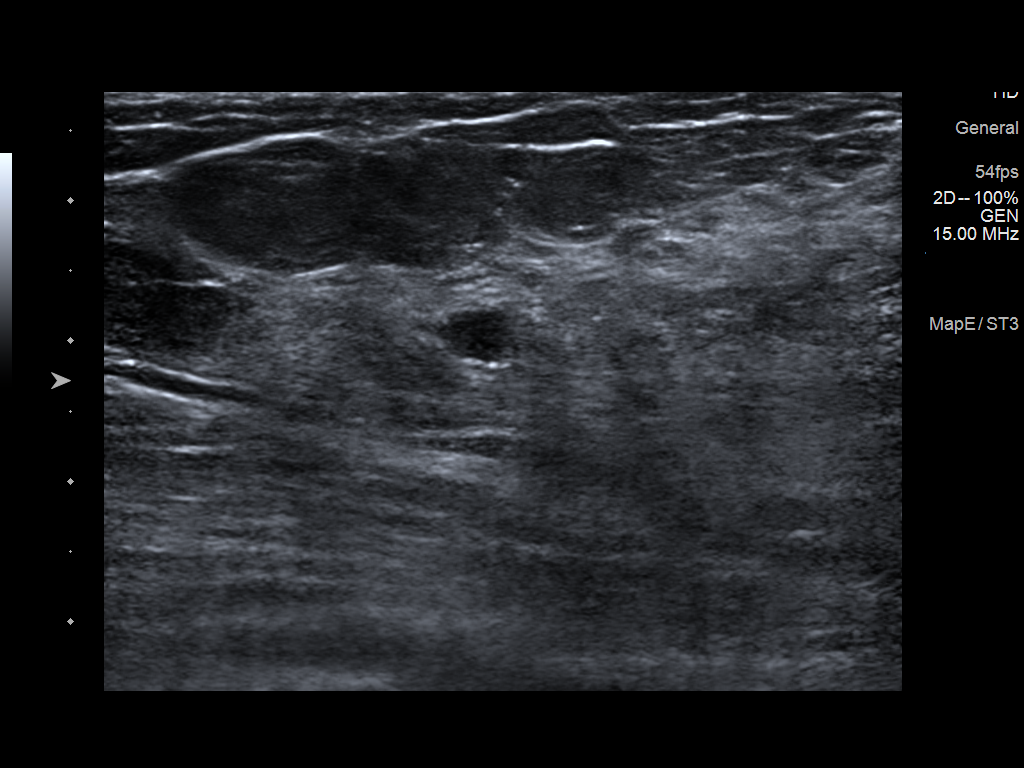
[im 5/6]
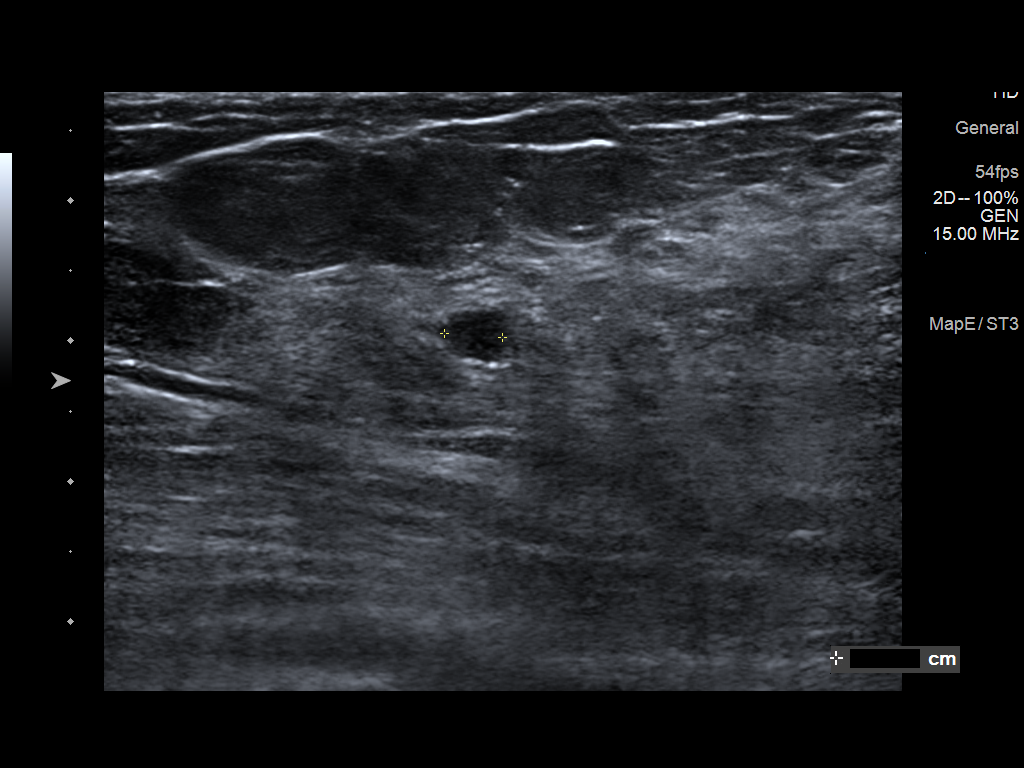
[im 6/6]
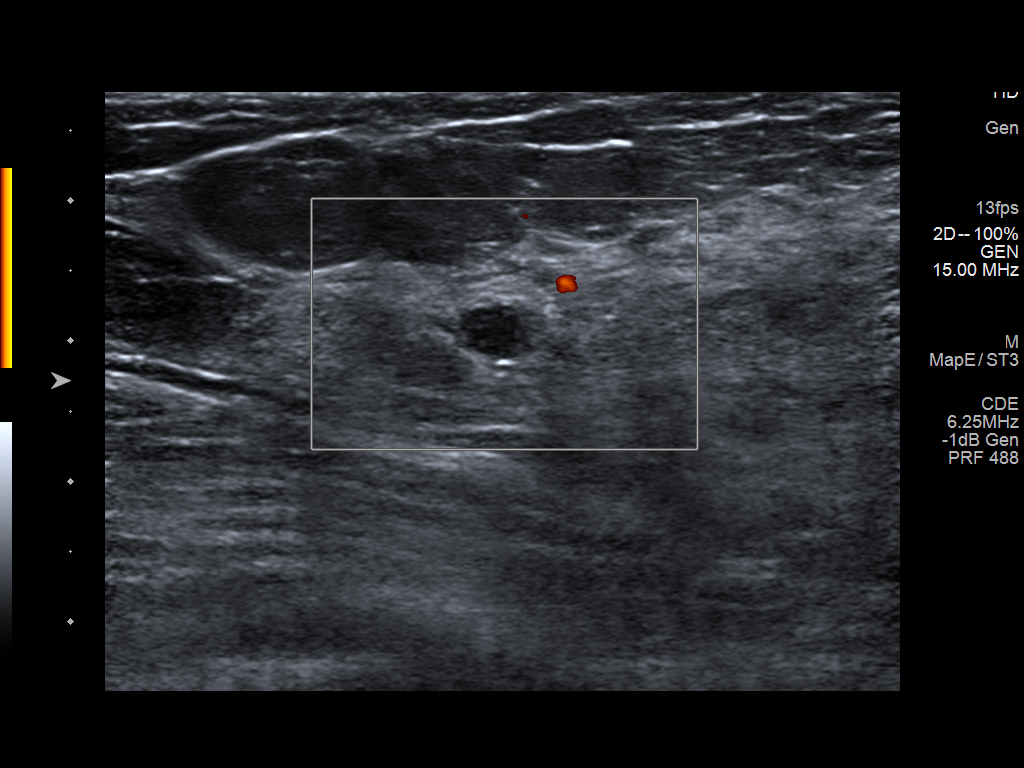

[6 of 6 positions shown; findings below may reference images not displayed]

ACR Breast Density Category c: The breast tissue is heterogeneously
dense, which may obscure small masses.
FINDINGS: Tomosynthesis and synthesized full field CC and MLO views of the
RIGHT breast were obtained. Tomosynthesis and synthesized spot
compression tangential view of the area of concern in the RIGHT
breast was also obtained.

No mammographic abnormality in the area of focal pain and prior
palpable concern in the UPPER breast on the spot tangential images.
Normal dense fibroglandular tissue is present in this location.

No findings suspicious for malignancy in the RIGHT breast.

Mammographic images were processed with CAD.

Targeted RIGHT breast ultrasound is performed, showing scattered
benign cysts at the 12:30 o'clock position approximately 5 cm from
the nipple in the area of focal pain, the largest measuring
approximately 4 mm. No suspicious solid mass or acoustic shadowing
is identified.
IMPRESSION: 1. No mammographic or sonographic evidence of malignancy involving
the RIGHT breast.
2. Scattered small benign cysts at the 12:30 o'clock position
approximately 5 cm from nipple, the largest measuring 4 mm.

RECOMMENDATION:
Annual BILATERAL screening mammography which is due in September 2020.

I have discussed the findings and recommendations with the patient.
If applicable, a reminder letter will be sent to the patient
regarding the next appointment.

BI-RADS CATEGORY  2: Benign.

## 2021-10-29 ENCOUNTER — Telehealth (INDEPENDENT_AMBULATORY_CARE_PROVIDER_SITE_OTHER): Payer: Medicare Other | Admitting: Nurse Practitioner

## 2021-10-29 ENCOUNTER — Other Ambulatory Visit: Payer: Self-pay

## 2021-10-29 DIAGNOSIS — J029 Acute pharyngitis, unspecified: Secondary | ICD-10-CM | POA: Diagnosis not present

## 2021-10-29 DIAGNOSIS — J069 Acute upper respiratory infection, unspecified: Secondary | ICD-10-CM

## 2021-10-29 NOTE — Progress Notes (Signed)
Subjective:    Patient ID: Amy Aguilar, female    DOB: [birth year], 46 y.o.   MRN: [id number]  HPI: Amy Aguilar is a 46 y.o. female presenting virtually for sore throat.  Chief Complaint  Patient presents with   Sore Throat   SORE THROAT Onset: started 2 weeks ago; all of the way gone now Fever: no Body aches: no Chills: no Cough: no Shortness of breath: no Wheezing: no Chest pain: no Chest tightness: no Chest congestion: no Nasal congestion:  no; improved Runny nose: no Post nasal drip: no Sneezing: no Sore throat: yes; improved now Swollen glands: no; they were tender and swollen but now improved Sinus pressure: no Headache: no Face pain: no Toothache: no Ear pain: no  Ear pressure: no  Eyes red/itching:no Eye drainage/crusting: no  Nausea: no  Vomiting: no Diarrhea: no  Change in appetite: yes; was painful to eat/swallow Loss of taste/smell: no  Rash: no Fatigue: no Sick contacts: yes; son has fever and sore throat  Strep contacts: none known Context: improving Recurrent sinusitis: no Treatments attempted: Dayquil/Nyquil Relief with OTC medications: yes   No Known Allergies  Outpatient Encounter Medications as of 10/29/2021  Medication Sig   phentermine (ADIPEX-P) 37.5 MG tablet Take 1 tablet (37.5 mg total) by mouth daily before breakfast.   pregabalin (LYRICA) 75 MG capsule TAKE 1 CAPSULE (75 MG TOTAL) BY MOUTH 3 (THREE) TIMES DAILY AS NEEDED (NERVE PAIN). (Patient not taking: No sig reported)   No facility-administered encounter medications on file as of 10/29/2021.    Patient Active Problem List   Diagnosis Date Noted   Migraine with aura and without status migrainosus, not intractable 09/25/2018   S/P carpal tunnel release 07/12/2018   Dizziness 02/21/2018   Headache 02/21/2018   Varicose veins of bilateral lower extremities with pain 04/13/2017   Class 1 obesity without serious comorbidity with body mass index  (BMI) of 34.0 to 34.9 in adult 04/13/2017   Carpal tunnel syndrome, left 09/14/2016   Carpal tunnel syndrome, right upper limb 09/14/2016   Fibroids 12/05/2014   Leiomyoma of uterus 11/21/2014   Anemia 11/21/2014   Menorrhagia 09/18/2012    Past Medical History:  Diagnosis Date   Abnormal uterine bleeding (AUB)    Anemia    received blood transfusion 2 wks ago   Blood transfusion without reported diagnosis    Headache    migraines   Leiomyoma of uterus    Uterine fibroid     Relevant past medical, surgical, family and social history reviewed and updated as indicated. Interim medical history since our last visit reviewed.  Review of Systems Per HPI unless specifically indicated above     Objective:    LMP 11/21/2014   Wt Readings from Last 3 Encounters:  05/14/21 210 lb (95.3 kg)  01/22/21 215 lb (97.5 kg)  09/11/20 212 lb (96.2 kg)    Physical Exam Nursing note reviewed.  Constitutional:      General: She is not in acute distress.    Appearance: Normal appearance. She is not toxic-appearing.  HENT:     Head: Normocephalic and atraumatic.     Right Ear: External ear normal.     Left Ear: External ear normal.     Nose: Nose normal. No congestion.     Mouth/Throat:     Mouth: Mucous membranes are moist.     Pharynx: Oropharynx is clear.  Pulmonary:     Effort: Pulmonary effort is normal. No respiratory distress.  Skin:    Coloration: Skin is not jaundiced or pale.     Findings: No erythema.  Neurological:     Mental Status: She is alert and oriented to person, place, and time.  Psychiatric:        Mood and Affect: Mood normal.        Thought Content: Thought content normal.        Judgment: Judgment normal.      Assessment & Plan:  1. Viral upper respiratory tract infection Acute.  Suspect sore throat is viral in etiology.  Will check strep culture, although symptoms have improved and are not classic.  Follow up pending results.   - STREP GROUP A AG,  W/REFLEX TO CULT  2. Sore throat  - STREP GROUP A AG, W/REFLEX TO CULT     Follow up plan: Return if symptoms worsen or fail to improve.   Due to the catastrophic nature of the COVID-19 pandemic, this video visit was completed soley via audio and visual contact via [facility] due to the restrictions of the COVID-19 pandemic.  All issues as above were discussed an 8 minutes with patient face to face via video conference. More than 50% of this time was spent in counseling and coordination of care. 15 minutes total spent in review of patient's record and preparation of their chart.d addressed. Physical exam was done as above through visual confirmation on [facility]. If it was felt that the patient should be evaluated in the office, they were directed there. The patient verbally consented to this visit. Location of the patient: home Location of the provider: work Those involved with this call:  Provider: Noemi Chapel, DNP, FNP-C CMA: Elizabeth Palau, CMA Front Desk/Registration: Vevelyn Pat  Time spent on call:  I verified patient identity using two factors (patient name and date of birth). Patient consents verbally to being seen via telemedicine visit today.

## 2021-10-31 LAB — CULTURE, GROUP A STREP
MICRO NUMBER:: 12837159
SPECIMEN QUALITY:: ADEQUATE

## 2021-10-31 LAB — STREP GROUP A AG, W/REFLEX TO CULT

## 2021-12-02 ENCOUNTER — Encounter: Payer: Self-pay | Admitting: Nurse Practitioner

## 2021-12-02 ENCOUNTER — Ambulatory Visit (INDEPENDENT_AMBULATORY_CARE_PROVIDER_SITE_OTHER): Payer: Medicare Other | Admitting: Nurse Practitioner

## 2021-12-02 ENCOUNTER — Other Ambulatory Visit: Payer: Self-pay

## 2021-12-02 VITALS — BP 122/80 | HR 79 | Ht 61.0 in | Wt 196.0 lb

## 2021-12-02 DIAGNOSIS — R109 Unspecified abdominal pain: Secondary | ICD-10-CM

## 2021-12-02 LAB — URINALYSIS, ROUTINE W REFLEX MICROSCOPIC
Bacteria, UA: NONE SEEN /HPF
Bilirubin Urine: NEGATIVE
Glucose, UA: NEGATIVE
Hyaline Cast: NONE SEEN /LPF
Ketones, ur: NEGATIVE
Leukocytes,Ua: NEGATIVE
Nitrite: NEGATIVE
Protein, ur: NEGATIVE
Specific Gravity, Urine: 1.015 (ref 1.001–1.035)
WBC, UA: NONE SEEN /HPF (ref 0–5)
pH: 7 (ref 5.0–8.0)

## 2021-12-02 LAB — MICROSCOPIC MESSAGE

## 2021-12-02 MED ORDER — TAMSULOSIN HCL 0.4 MG PO CAPS: 0.4000 mg | ORAL_CAPSULE | Freq: Every day | ORAL | 0 refills | Status: DC

## 2021-12-02 NOTE — Progress Notes (Signed)
Subjective:    Patient ID: Amy Aguilar, female    DOB: 11-26-1975, 46 y.o.   MRN: [id number]  HPI: Amy Aguilar is a 46 y.o. female presenting for abdominal pain.  Chief Complaint  Patient presents with   Abdominal Pain    Left side   ABDOMINAL PAIN  Duration: 1 week ago first noticed; 2 days ago worse, worse this morning could not move when she woke up Onset: sudden Severity: 7/10 Quality: pressure, constant, sore Location:  left side; back  Episode duration: hours Radiation: no Frequency: constant Fever: no Nausea: yes - yesterday, none now Vomiting: no Weight loss: no Decreased appetite: no - eating and drinking normally Diarrhea: no Constipation: no Blood in stool: no Heartburn: no Jaundice: no Rash: no Dysuria/urinary frequency: no Hematuria: no Recurrent NSAID use: no  Alleviating factors: changing positions, stretching Aggravating factors: laying down on left side Status: worse Treatments attempted: nothing LMP: hysterectomy 6 years ago; denies vaginal bleeding or discharge.  Denies blood in urine.  No Known Allergies  Outpatient Encounter Medications as of 12/02/2021  Medication Sig   Ascorbic Acid (VITAMIN C PO) Take by mouth.   [DISCONTINUED] phentermine (ADIPEX-P) 37.5 MG tablet Take 1 tablet (37.5 mg total) by mouth daily before breakfast.   [DISCONTINUED] pregabalin (LYRICA) 75 MG capsule TAKE 1 CAPSULE (75 MG TOTAL) BY MOUTH 3 (THREE) TIMES DAILY AS NEEDED (NERVE PAIN). (Patient not taking: No sig reported)   No facility-administered encounter medications on file as of 12/02/2021.    Patient Active Problem List   Diagnosis Date Noted   Migraine with aura and without status migrainosus, not intractable 09/25/2018   S/P carpal tunnel release 07/12/2018   Dizziness 02/21/2018   Headache 02/21/2018   Varicose veins of bilateral lower extremities with pain 04/13/2017   Class 1 obesity without serious comorbidity with  body mass index (BMI) of 34.0 to 34.9 in adult 04/13/2017   Carpal tunnel syndrome, left 09/14/2016   Carpal tunnel syndrome, right upper limb 09/14/2016   Fibroids 12/05/2014   Leiomyoma of uterus 11/21/2014   Anemia 11/21/2014   Menorrhagia 09/18/2012    Past Medical History:  Diagnosis Date   Abnormal uterine bleeding (AUB)    Anemia    received blood transfusion 2 wks ago   Blood transfusion without reported diagnosis    Headache    migraines   Leiomyoma of uterus    Uterine fibroid     Relevant past medical, surgical, family and social history reviewed and updated as indicated. Interim medical history since our last visit reviewed.  Review of Systems Per HPI unless specifically indicated above     Objective:    BP 122/80    Pulse 79    Ht 5\' 1"  (1.549 m)    Wt 196 lb (88.9 kg)    LMP 11/21/2014    SpO2 98%    BMI 37.03 kg/m   Wt Readings from Last 3 Encounters:  12/02/21 196 lb (88.9 kg)  05/14/21 210 lb (95.3 kg)  01/22/21 215 lb (97.5 kg)    Physical Exam Vitals and nursing note reviewed.  Constitutional:      General: She is not in acute distress.    Appearance: She is well-developed. She is obese. She is not toxic-appearing.  HENT:     Head: Normocephalic and atraumatic.     Mouth/Throat:     Mouth: Mucous membranes are moist.     Pharynx: No pharyngeal swelling or oropharyngeal exudate.  Eyes:  General: No scleral icterus.    Extraocular Movements: Extraocular movements intact.  Cardiovascular:     Rate and Rhythm: Normal rate and regular rhythm.     Heart sounds: No murmur heard. Pulmonary:     Effort: Pulmonary effort is normal. No respiratory distress.     Breath sounds: Normal breath sounds. No wheezing, rhonchi or rales.  Abdominal:     General: Abdomen is flat. Bowel sounds are decreased. There is no distension or abdominal bruit.     Palpations: Abdomen is soft. There is no hepatomegaly, splenomegaly, mass or pulsatile mass.      Tenderness: There is no abdominal tenderness. There is no right CVA tenderness, left CVA tenderness or guarding. Negative signs include Murphy's sign, Rovsing's sign and McBurney's sign.       Comments: Patient gestures to area marked as painful, pain not reproduced with exam.  Musculoskeletal:       Arms:     Comments: Patient points to area as area with pain.  No skin changes, swelling, redness, obvious cause.  No CVA tenderness.  Skin:    General: Skin is warm and dry.     Capillary Refill: Capillary refill takes less than 2 seconds.     Coloration: Skin is not cyanotic, jaundiced or pale.  Neurological:     Mental Status: She is alert and oriented to person, place, and time.  Psychiatric:        Mood and Affect: Mood normal. Mood is not anxious or depressed.        Behavior: Behavior normal.       Assessment & Plan:  1. Left sided abdominal pain Acute.  No infectious symptoms and pain not reproduced on examination today.  Suspect muscle strain vs. Possible kidney stone.  UA today shows trace blood, 0-2 RBC under microscope.  Suspect kidney stone.  Treat with Flomax nightly to help pass kidney stone.  She has had a hysterectomy, therefore the blood is abnormal.  Follow up if pain worsens or with no improvement over the next week.  With nausea/vomiting, and unable to keep fluids or food down, go to the ER.   - Urinalysis, Routine w reflex microscopic     Follow up plan: Return if symptoms worsen or fail to improve.

## 2021-12-24 ENCOUNTER — Other Ambulatory Visit: Payer: Self-pay | Admitting: Nurse Practitioner

## 2021-12-24 DIAGNOSIS — R109 Unspecified abdominal pain: Secondary | ICD-10-CM

## 2021-12-31 ENCOUNTER — Ambulatory Visit (INDEPENDENT_AMBULATORY_CARE_PROVIDER_SITE_OTHER): Payer: Medicare Other | Admitting: Family Medicine

## 2021-12-31 ENCOUNTER — Encounter: Payer: Self-pay | Admitting: Family Medicine

## 2021-12-31 ENCOUNTER — Other Ambulatory Visit: Payer: Self-pay

## 2021-12-31 VITALS — BP 132/78 | HR 83 | Temp 97.5°F | Resp 18 | Ht 61.0 in | Wt 196.0 lb

## 2021-12-31 DIAGNOSIS — T148XXA Other injury of unspecified body region, initial encounter: Secondary | ICD-10-CM | POA: Diagnosis not present

## 2021-12-31 NOTE — Progress Notes (Signed)
? ?Subjective:  ? ? Patient ID: Amy Aguilar, female    DOB: 1976-10-21, 46 y.o.   MRN: [id number] ?Patient recently saw my partner and due to some left-sided abdominal wall hernia and has some trace hematuria she was started on tamsulosin for possible kidney stone.  The pain has completely subsided.  She denies any further issues.  However once she started the tamsulosin she developed an occipital headache as well as some blurry vision.  She has a history of migraines.  She discontinued the tamsulosin and the headache has resolved.  She denies any further headaches.  She denies any abdominal pain.  She is concerned because she has small areas of bruising on her arms and on her legs.  Therefore bruises today all the size of a quarter.  All are faded and yellow.  She denies any injury. ?Past Medical History:  ?Diagnosis Date  ? Abnormal uterine bleeding (AUB)   ? Anemia   ? received blood transfusion 2 wks ago  ? Blood transfusion without reported diagnosis   ? Headache   ? migraines  ? Leiomyoma of uterus   ? Uterine fibroid   ? ?Past Surgical History:  ?Procedure Laterality Date  ? ABDOMINAL HYSTERECTOMY    ? BILATERAL SALPINGECTOMY Bilateral 12/05/2014  ? Procedure: BILATERAL SALPINGECTOMY;  Surgeon: Margarette Asal, MD;  Location: [facility];  Service: Gynecology;  Laterality: Bilateral;  ? CARPAL TUNNEL RELEASE Right 06/28/2018  ? Procedure: RIGHT CARPAL TUNNEL RELEASE;  Surgeon: Leandrew Koyanagi, MD;  Location: [facility];  Service: Orthopedics;  Laterality: Right;  ? LAPAROSCOPIC ASSISTED VAGINAL HYSTERECTOMY N/A 12/05/2014  ? Procedure: LAPAROSCOPY, ATTEMPTED VAGINAL HYSTERECTOMY, TOTAL ABDOMINAL HYSTERECTOMY;  Surgeon: Margarette Asal, MD;  Location: [facility];  Service: Gynecology;  Laterality: N/A;  ? NERVE REPAIR Right 03/08/2019  ? Procedure: EXPLORATION MEDIAN NERVE,  NERVE REPAIR WITH WRAP;  Surgeon: Leanora Cover, MD;  Location: [facility];  Service: Orthopedics;  Laterality: Right;  ? ?Current Outpatient Medications on File Prior to Visit  ?Medication Sig Dispense Refill  ? Ascorbic Acid (VITAMIN C PO) Take by mouth.    ? tamsulosin (FLOMAX) 0.4 MG CAPS capsule TAKE 1 CAPSULE BY MOUTH EVERYDAY AT BEDTIME 90 capsule 0  ? ?No current facility-administered medications on file prior to visit.  ? ?No Known Allergies ?Social History  ? ?Socioeconomic History  ? Marital status: Divorced  ?  Spouse name: Not on file  ? Number of children: Not on file  ? Years of education: Not on file  ? Highest education level: Not on file  ?Occupational History  ? Not on file  ?Tobacco Use  ? Smoking status: Never  ? Smokeless tobacco: Never  ?Vaping Use  ? Vaping Use: Never used  ?Substance and Sexual Activity  ? Alcohol use: No  ? Drug use: No  ? Sexual activity: Yes  ?  Birth control/protection: Surgical  ?  Comment: hysterectomy  ?Other Topics Concern  ? Not on file  ?Social History Narrative  ? ** Merged History Encounter **  ?    ? Marital status: married x 17 years; happily married.  From [place]; moved to [place] 2000. ?Children: 5 children; no grandchildren.  ?Living: husband, 5 children. ?Employment: homemaker.  ? ?Social Determinants of Health  ? ?Financial Resource Strain: Not on file  ?Food Insecurity: Not on file  ?Transportation Needs: Not on file  ?Physical Activity: Not on file  ?Stress: Not on file  ?  Social Connections: Not on file  ?Intimate Partner Violence: Not on file  ? ? ? ? ?Review of Systems  ?Neurological:  Positive for headaches.  ?All other systems reviewed and are negative. ? ?   ?Objective:  ? Physical Exam ?Constitutional:   ?   Appearance: Normal appearance. She is normal weight.  ?HENT:  ?   Head:  ? ?Cardiovascular:  ?   Rate and Rhythm: Normal rate and regular rhythm.  ?   Heart sounds: No murmur heard. ?  No friction rub. No gallop.  ?Pulmonary:  ?   Effort: Pulmonary effort is normal. No respiratory distress.  ?   Breath  sounds: Normal breath sounds. No stridor. No wheezing or rhonchi.  ?Abdominal:  ?   General: Abdomen is flat. Bowel sounds are normal.  ?   Palpations: Abdomen is soft.  ?Musculoskeletal:  ?   Right hand: Tenderness present. Decreased range of motion. Decreased strength. Decreased sensation of the median distribution. There is disruption of two-point discrimination.  ?   Right lower leg: Edema present.  ?   Left lower leg: Edema present.  ?Neurological:  ?   General: No focal deficit present.  ?   Mental Status: She is alert and oriented to person, place, and time. Mental status is at baseline.  ?   Cranial Nerves: No cranial nerve deficit.  ?   Motor: No weakness.  ?   Coordination: Coordination normal.  ?   Gait: Gait normal.  ?   Deep Tendon Reflexes: Reflexes normal.  ? ? ? ? ? ?   ?Assessment & Plan:  ?Bruising - Plan: CBC with Differential/Platelet, COMPLETE METABOLIC PANEL WITH GFR ?I believe the patient's headache was a migraine.  It has subsided now.  If she develops future headaches we can try Imitrex.  I believe the bruising is benign.  However I will check a CBC and a CMP to evaluate for any liver dysfunction or low platelet levels or abnormalities on the bone marrow test.  Patient's abdominal pain has subsided so she can discontinue tamsulosin. ?

## 2022-01-01 LAB — CBC WITH DIFFERENTIAL/PLATELET
Absolute Monocytes: 266 cells/uL (ref 200–950)
Basophils Absolute: 30 cells/uL (ref 0–200)
Basophils Relative: 0.4 %
Eosinophils Absolute: 122 cells/uL (ref 15–500)
Eosinophils Relative: 1.6 %
HCT: 39.5 % (ref 35.0–45.0)
Hemoglobin: 13.2 g/dL (ref 11.7–15.5)
Lymphs Abs: 3108 cells/uL (ref 850–3900)
MCH: 29.1 pg (ref 27.0–33.0)
MCHC: 33.4 g/dL (ref 32.0–36.0)
MCV: 87.2 fL (ref 80.0–100.0)
MPV: 11.7 fL (ref 7.5–12.5)
Monocytes Relative: 3.5 %
Neutro Abs: 4074 cells/uL (ref 1500–7800)
Neutrophils Relative %: 53.6 %
Platelets: 247 10*3/uL (ref 140–400)
RBC: 4.53 10*6/uL (ref 3.80–5.10)
RDW: 13 % (ref 11.0–15.0)
Total Lymphocyte: 40.9 %
WBC: 7.6 10*3/uL (ref 3.8–10.8)

## 2022-01-01 LAB — COMPLETE METABOLIC PANEL WITH GFR
AG Ratio: 1.6 (calc) (ref 1.0–2.5)
ALT: 22 U/L (ref 6–29)
AST: 19 U/L (ref 10–30)
Albumin: 4.4 g/dL (ref 3.6–5.1)
Alkaline phosphatase (APISO): 109 U/L (ref 31–125)
BUN: 15 mg/dL (ref 7–25)
CO2: 25 mmol/L (ref 20–32)
Calcium: 9.3 mg/dL (ref 8.6–10.2)
Chloride: 104 mmol/L (ref 98–110)
Creat: 0.6 mg/dL (ref 0.50–0.99)
Globulin: 2.8 g/dL (calc) (ref 1.9–3.7)
Glucose, Bld: 89 mg/dL (ref 65–99)
Potassium: 3.9 mmol/L (ref 3.5–5.3)
Sodium: 140 mmol/L (ref 135–146)
Total Bilirubin: 1 mg/dL (ref 0.2–1.2)
Total Protein: 7.2 g/dL (ref 6.1–8.1)
eGFR: 113 mL/min/{1.73_m2} (ref >=60)

## 2022-01-14 ENCOUNTER — Other Ambulatory Visit: Payer: Self-pay

## 2022-01-14 ENCOUNTER — Ambulatory Visit (INDEPENDENT_AMBULATORY_CARE_PROVIDER_SITE_OTHER): Payer: Medicare Other

## 2022-01-14 VITALS — Ht 61.0 in | Wt 196.0 lb

## 2022-01-14 DIAGNOSIS — Z0000 Encounter for general adult medical examination without abnormal findings: Secondary | ICD-10-CM

## 2022-01-14 NOTE — Progress Notes (Signed)
? ?Subjective:  ? Amy Aguilar is a 46 y.o. female who presents for an Initial Medicare Annual Wellness Visit. ? ?Review of Systems    ? ?Cardiac Risk Factors include: Other (see comment);sedentary lifestyle;obesity (BMI >30kg/m2), Risk factor comments: Migraines. ? ?   ?Objective:  ?  ?Today's Vitals  ? 01/14/22 1451 01/14/22 1454  ?Weight: 196 lb (88.9 kg)   ?Height: '5\' 1"'$  (1.549 m)   ?PainSc:  5   ? ?Body mass index is 37.03 kg/m?. ? ? ?  01/14/2022  ?  3:09 PM 03/08/2019  ?  7:21 PM 03/08/2019  ?  9:02 AM 03/01/2019  ?  4:28 PM 12/27/2018  ? 10:04 AM 06/28/2018  ?  9:41 AM 06/27/2018  ? 12:46 PM  ?Advanced Directives  ?Does Patient Have a Medical Advance Directive? No No No No No No No  ?Would patient like information on creating a medical advance directive? No - Patient declined No - Patient declined No - Patient declined  Yes (MAU/Ambulatory/Procedural Areas - Information given) No - Patient declined No - Patient declined  ? ? ?Current Medications (verified) ?Outpatient Encounter Medications as of 01/14/2022  ?Medication Sig  ? Ascorbic Acid (VITAMIN C PO) Take by mouth. (Patient not taking: Reported on 01/14/2022)  ? tamsulosin (FLOMAX) 0.4 MG CAPS capsule TAKE 1 CAPSULE BY MOUTH EVERYDAY AT BEDTIME (Patient not taking: Reported on 01/14/2022)  ? ?No facility-administered encounter medications on file as of 01/14/2022.  ? ? ?Allergies (verified) ?Patient has no known allergies.  ? ?History: ?Past Medical History:  ?Diagnosis Date  ? Abnormal uterine bleeding (AUB)   ? Anemia   ? received blood transfusion 2 wks ago  ? Blood transfusion without reported diagnosis   ? Headache   ? migraines  ? Leiomyoma of uterus   ? Uterine fibroid   ? ?Past Surgical History:  ?Procedure Laterality Date  ? ABDOMINAL HYSTERECTOMY    ? BILATERAL SALPINGECTOMY Bilateral 12/05/2014  ? Procedure: BILATERAL SALPINGECTOMY;  Surgeon: Margarette Asal, MD;  Location: [facility];  Service: Gynecology;  Laterality:  Bilateral;  ? CARPAL TUNNEL RELEASE Right 06/28/2018  ? Procedure: RIGHT CARPAL TUNNEL RELEASE;  Surgeon: Leandrew Koyanagi, MD;  Location: [place];  Service: Orthopedics;  Laterality: Right;  ? LAPAROSCOPIC ASSISTED VAGINAL HYSTERECTOMY N/A 12/05/2014  ? Procedure: LAPAROSCOPY, ATTEMPTED VAGINAL HYSTERECTOMY, TOTAL ABDOMINAL HYSTERECTOMY;  Surgeon: Margarette Asal, MD;  Location: [place];  Service: Gynecology;  Laterality: N/A;  ? NERVE REPAIR Right 03/08/2019  ? Procedure: EXPLORATION MEDIAN NERVE,  NERVE REPAIR WITH WRAP;  Surgeon: Leanora Cover, MD;  Location: [place];  Service: Orthopedics;  Laterality: Right;  ? ?Family History  ?Problem Relation Age of Onset  ? Hypertension Father   ? ?Social History  ? ?Socioeconomic History  ? Marital status: Divorced  ?  Spouse name: Not on file  ? Number of children: 5  ? Years of education: Not on file  ? Highest education level: Not on file  ?Occupational History  ? Not on file  ?Tobacco Use  ? Smoking status: Never  ? Smokeless tobacco: Never  ?Vaping Use  ? Vaping Use: Never used  ?Substance and Sexual Activity  ? Alcohol use: No  ? Drug use: No  ? Sexual activity: Yes  ?  Birth control/protection: Surgical  ?  Comment: hysterectomy  ?Other Topics Concern  ? Not on file  ?Social History Narrative  ? ** Merged History Encounter ** Marital  status: married x 17 years; currently separated as of 12/2021.  From [place]; moved to [place] 2000.  ? Children: 5 children; no grandchildren.   ? Living: husband, 5 children.  ? Employment: homemaker.  ? ?Social Determinants of Health  ? ?Financial Resource Strain: Low Risk   ? Difficulty of Paying Living Expenses: Not very hard  ?Food Insecurity: No Food Insecurity  ? Worried About Charity fundraiser in the Last Year: Never true  ? Ran Out of Food in the Last Year: Never true  ?Transportation Needs: No Transportation Needs  ? Lack of Transportation (Medical): No  ? Lack of Transportation  (Non-Medical): No  ?Physical Activity: Sufficiently Active  ? Days of Exercise per Week: 5 days  ? Minutes of Exercise per Session: 30 min  ?Stress: No Stress Concern Present  ? Feeling of Stress : Not at all  ?Social Connections: Moderately Integrated  ? Frequency of Communication with Friends and Family: More than three times a week  ? Frequency of Social Gatherings with Friends and Family: More than three times a week  ? Attends Religious Services: 1 to 4 times per year  ? Active Member of Clubs or Organizations: Yes  ? Attends Archivist Meetings: 1 to 4 times per year  ? Marital Status: Separated  ? ? ?Tobacco Counseling ?Counseling given: Not Answered ? ? ?Clinical Intake: ? ?Pre-visit preparation completed: Yes ? ?Pain : 0-10 ?Pain Score: 5  ?Pain Type: Chronic pain ?Pain Location: Wrist ?Pain Descriptors / Indicators: Aching, Dull ?Pain Onset: More than a month ago ?Pain Frequency: Intermittent ? ?  ? ?BMI - recorded: 37.03 ?Nutritional Status: BMI > 30  Obese ?Nutritional Risks: None ?Diabetes: No ? ?How often do you need to have someone help you when you read instructions, pamphlets, or other written materials from your doctor or pharmacy?: 1 - Never ? ?Diabetic?No ? ?Interpreter Needed?: No ? ?Information entered by :: mj Cassi Jenne, lpn ? ? ?Activities of Daily Living ? ?  01/14/2022  ?  3:11 PM  ?In your present state of health, do you have any difficulty performing the following activities:  ?Hearing? 0  ?Vision? 0  ?Difficulty concentrating or making decisions? 0  ?Walking or climbing stairs? 0  ?Dressing or bathing? 0  ?Doing errands, shopping? 0  ?Preparing Food and eating ? N  ?Using the Toilet? N  ?In the past six months, have you accidently leaked urine? Y  ?Comment after hyst but is better now  ?Do you have problems with loss of bowel control? N  ?Managing your Medications? N  ?Managing your Finances? N  ?Housekeeping or managing your Housekeeping? N  ? ? ?Patient Care Team: ?Susy Frizzle, MD as PCP - General (Family Medicine) ? ?Indicate any recent Medical Services you may have received from other than [facility] providers in the past year (date may be approximate). ? ?   ?Assessment:  ? This is a routine wellness examination for Amena. ? ?Hearing/Vision screen ?Hearing Screening - Comments:: No hearing issues.  ? ?Vision Screening - Comments:: No glasses.  ? ?Dietary issues and exercise activities discussed: ?Current Exercise Habits: Home exercise routine, Type of exercise: walking, Time (Minutes): 30, Frequency (Times/Week): 3, Weekly Exercise (Minutes/Week): 90, Intensity: Mild, Exercise limited by: neurologic condition(s);orthopedic condition(s) ? ? Goals Addressed   ? ?  ?  ?  ?  ? This Visit's Progress  ?  Exercise 3x per week (30 min per time)     ?  Increase exercise and eat healthier.  ?  ? ?  ? ?Depression Screen ? ?  01/14/2022  ?  2:59 PM 05/14/2021  ?  9:40 AM 03/25/2020  ?  8:35 AM 09/25/2018  ?  3:15 PM 08/14/2018  ? 12:04 PM  ?PHQ 2/9 Scores  ?PHQ - 2 Score 0 0 0 0 0  ?PHQ- 9 Score   0 0 1  ?  ?Fall Risk ? ?  01/14/2022  ?  3:10 PM 05/14/2021  ?  9:40 AM  ?Fall Risk   ?Falls in the past year? 0 0  ?Number falls in past yr: 0 0  ?Injury with Fall? 0 0  ?Risk for fall due to : Other (Comment) No Fall Risks  ?Follow up Falls prevention discussed Falls evaluation completed  ? ? ?FALL RISK PREVENTION PERTAINING TO THE HOME: ? ?Any stairs in or around the home? No  ?If so, are there any without handrails? No  ?Home free of loose throw rugs in walkways, pet beds, electrical cords, etc? Yes  ?Adequate lighting in your home to reduce risk of falls? Yes  ? ?ASSISTIVE DEVICES UTILIZED TO PREVENT FALLS: ? ?Life alert? No  ?Use of a cane, walker or w/c? No  ?Grab bars in the bathroom? Yes  ?Shower chair or bench in shower? No  ?Elevated toilet seat or a handicapped toilet? No  ? ?TIMED UP AND GO: ? ?Was the test performed? Yes .  ?Length of time to ambulate 10 feet: 8 sec.  ? ?Gait steady and  fast without use of assistive device ? ?Cognitive Function: ?  ?  ? ?  01/14/2022  ?  3:13 PM  ?6CIT Screen  ?What Year? 0 points  ?What month? 0 points  ?What time? 0 points  ?Count back from 20 0 points  ?Months in

## 2022-01-14 NOTE — Patient Instructions (Signed)
[name] , ?Thank you for taking time to come for your Medicare Wellness Visit. I appreciate your ongoing commitment to your health goals. Please review the following plan we discussed and let me know if I can assist you in the future.  ? ?Screening recommendations/referrals: ?Colonoscopy: Not due until age 45. ?Mammogram: Done 12/16/2020 Repeat annually ? ?Bone Density: Due at age 7.  ? ?Recommended yearly ophthalmology/optometry visit for glaucoma screening and checkup ?Recommended yearly dental visit for hygiene and checkup ? ?Vaccinations: ?Influenza vaccine: Due Repeat annually ? ?Pneumococcal vaccine: Due at age 71.  ?Tdap vaccine: Done 03/25/2020 Repeat in 10 years ? ?Shingles vaccine: Due at age 32. ?Covid-19: Done 04/30/2020 ? ?Advanced directives: Advance directive discussed with you today. I have provided a copy for you to complete at home and have notarized. Once this is complete please bring a copy in to our office so we can scan it into your chart. ? ? ?Conditions/risks identified: Aim for 30 minutes of exercise or brisk walking, 6-8 glasses of water, and 5 servings of fruits and vegetables each day. ? ? ?Next appointment: Follow up in one year for your annual wellness visit. 2024. ? ?Preventive Care 40-64 Years, Female ?Preventive care refers to lifestyle choices and visits with your health care provider that can promote health and wellness. ?What does preventive care include? ?A yearly physical exam. This is also called an annual well check. ?Dental exams once or twice a year. ?Routine eye exams. Ask your health care provider how often you should have your eyes checked. ?Personal lifestyle choices, including: ?Daily care of your teeth and gums. ?Regular physical activity. ?Eating a healthy diet. ?Avoiding tobacco and drug use. ?Limiting alcohol use. ?Practicing safe sex. ?Taking low-dose aspirin daily starting at age 48. ?Taking vitamin and mineral supplements as recommended by your health  care provider. ?What happens during an annual well check? ?The services and screenings done by your health care provider during your annual well check will depend on your age, overall health, lifestyle risk factors, and family history of disease. ?Counseling  ?Your health care provider may ask you questions about your: ?Alcohol use. ?Tobacco use. ?Drug use. ?Emotional well-being. ?Home and relationship well-being. ?Sexual activity. ?Eating habits. ?Work and work Statistician. ?Method of birth control. ?Menstrual cycle. ?Pregnancy history. ?Screening  ?You may have the following tests or measurements: ?Height, weight, and BMI. ?Blood pressure. ?Lipid and cholesterol levels. These may be checked every 5 years, or more frequently if you are over 22 years old. ?Skin check. ?Lung cancer screening. You may have this screening every year starting at age 79 if you have a 30-pack-year history of smoking and currently smoke or have quit within the past 15 years. ?Fecal occult blood test (FOBT) of the stool. You may have this test every year starting at age 39. ?Flexible sigmoidoscopy or colonoscopy. You may have a sigmoidoscopy every 5 years or a colonoscopy every 10 years starting at age 32. ?Hepatitis C blood test. ?Hepatitis B blood test. ?Sexually transmitted disease (STD) testing. ?Diabetes screening. This is done by checking your blood sugar (glucose) after you have not eaten for a while (fasting). You may have this done every 1-3 years. ?Mammogram. This may be done every 1-2 years. Talk to your health care provider about when you should start having regular mammograms. This may depend on whether you have a family history of breast cancer. ?BRCA-related cancer screening. This may be done if you have a family history of breast, ovarian, tubal, or  peritoneal cancers. ?Pelvic exam and Pap test. This may be done every 3 years starting at age 18. Starting at age 110, this may be done every 5 years if you have a Pap test in  combination with an HPV test. ?Bone density scan. This is done to screen for osteoporosis. You may have this scan if you are at high risk for osteoporosis. ?Discuss your test results, treatment options, and if necessary, the need for more tests with your health care provider. ?Vaccines  ?Your health care provider may recommend certain vaccines, such as: ?Influenza vaccine. This is recommended every year. ?Tetanus, diphtheria, and acellular pertussis (Tdap, Td) vaccine. You may need a Td booster every 10 years. ?Zoster vaccine. You may need this after age 39. ?Pneumococcal 13-valent conjugate (PCV13) vaccine. You may need this if you have certain conditions and were not previously vaccinated. ?Pneumococcal polysaccharide (PPSV23) vaccine. You may need one or two doses if you smoke cigarettes or if you have certain conditions. ?Talk to your health care provider about which screenings and vaccines you need and how often you need them. ?This information is not intended to replace advice given to you by your health care provider. Make sure you discuss any questions you have with your health care provider. ?Document Released: 11/07/2015 Document Revised: 06/30/2016 Document Reviewed: 08/12/2015 ?Elsevier Interactive Patient Education ? 2017 Elsevier Inc. ? ? ? ?Fall Prevention in the Home ?Falls can cause injuries. They can happen to people of all ages. There are many things you can do to make your home safe and to help prevent falls. ?What can I do on the outside of my home? ?Regularly fix the edges of walkways and driveways and fix any cracks. ?Remove anything that might make you trip as you walk through a door, such as a raised step or threshold. ?Trim any bushes or trees on the path to your home. ?Use bright outdoor lighting. ?Clear any walking paths of anything that might make someone trip, such as rocks or tools. ?Regularly check to see if handrails are loose or broken. Make sure that both sides of any steps have  handrails. ?Any raised decks and porches should have guardrails on the edges. ?Have any leaves, snow, or ice cleared regularly. ?Use sand or salt on walking paths during winter. ?Clean up any spills in your garage right away. This includes oil or grease spills. ?What can I do in the bathroom? ?Use night lights. ?Install grab bars by the toilet and in the tub and shower. Do not use towel bars as grab bars. ?Use non-skid mats or decals in the tub or shower. ?If you need to sit down in the shower, use a plastic, non-slip stool. ?Keep the floor dry. Clean up any water that spills on the floor as soon as it happens. ?Remove soap buildup in the tub or shower regularly. ?Attach bath mats securely with double-sided non-slip rug tape. ?Do not have throw rugs and other things on the floor that can make you trip. ?What can I do in the bedroom? ?Use night lights. ?Make sure that you have a light by your bed that is easy to reach. ?Do not use any sheets or blankets that are too big for your bed. They should not hang down onto the floor. ?Have a firm chair that has side arms. You can use this for support while you get dressed. ?Do not have throw rugs and other things on the floor that can make you trip. ?What can I do in  the kitchen? ?Clean up any spills right away. ?Avoid walking on wet floors. ?Keep items that you use a lot in easy-to-reach places. ?If you need to reach something above you, use a strong step stool that has a grab bar. ?Keep electrical cords out of the way. ?Do not use floor polish or wax that makes floors slippery. If you must use wax, use non-skid floor wax. ?Do not have throw rugs and other things on the floor that can make you trip. ?What can I do with my stairs? ?Do not leave any items on the stairs. ?Make sure that there are handrails on both sides of the stairs and use them. Fix handrails that are broken or loose. Make sure that handrails are as long as the stairways. ?Check any carpeting to make sure  that it is firmly attached to the stairs. Fix any carpet that is loose or worn. ?Avoid having throw rugs at the top or bottom of the stairs. If you do have throw rugs, attach them to the floor with carpet tape.

## 2022-07-06 ENCOUNTER — Ambulatory Visit (INDEPENDENT_AMBULATORY_CARE_PROVIDER_SITE_OTHER): Payer: Medicare Other | Admitting: Family Medicine

## 2022-07-06 VITALS — BP 134/82 | HR 82 | Temp 98.0°F | Ht 61.0 in | Wt 208.0 lb

## 2022-07-06 DIAGNOSIS — J029 Acute pharyngitis, unspecified: Secondary | ICD-10-CM | POA: Diagnosis not present

## 2022-07-06 DIAGNOSIS — M722 Plantar fascial fibromatosis: Secondary | ICD-10-CM | POA: Diagnosis not present

## 2022-07-06 MED ORDER — DICLOFENAC SODIUM 75 MG PO TBEC: 75.0000 mg | DELAYED_RELEASE_TABLET | Freq: Two times a day (BID) | ORAL | 0 refills | Status: DC

## 2022-07-06 NOTE — Progress Notes (Signed)
Subjective:    Patient ID: Amy Aguilar, female    DOB: Jan 25, 1976, 46 y.o.   MRN: [id number]  Patient reports 2 concerns.  First, she had a sore throat 3 weeks ago that went away after about 4 days.  2 days ago the sore throat came back.  She denies any fevers or chills.  She denies any cough.  She does have some postnasal drip.  She denies any nausea or vomiting or diarrhea.  Strep screen in the office is negative.  On examination there is no lymphadenopathy in the neck.  There is no erythema or exudate in the posterior oropharynx.  Second issue is pain in her heel left greater than right.  She reports sharp pain at the insertion of the plantar fascia on the plantar surface of the calcaneus.  It hurts like someone stabbing her in the heel first thing in the morning.  It hurts to walk. Past Medical History:  Diagnosis Date   Abnormal uterine bleeding (AUB)    Anemia    received blood transfusion 2 wks ago   Blood transfusion without reported diagnosis    Headache    migraines   Leiomyoma of uterus    Uterine fibroid    Past Surgical History:  Procedure Laterality Date   ABDOMINAL HYSTERECTOMY     BILATERAL SALPINGECTOMY Bilateral 12/05/2014   Procedure: BILATERAL SALPINGECTOMY;  Surgeon: Margarette Asal, MD;  Location: [facility];  Service: Gynecology;  Laterality: Bilateral;   CARPAL TUNNEL RELEASE Right 06/28/2018   Procedure: RIGHT CARPAL TUNNEL RELEASE;  Surgeon: Leandrew Koyanagi, MD;  Location: [place];  Service: Orthopedics;  Laterality: Right;   LAPAROSCOPIC ASSISTED VAGINAL HYSTERECTOMY N/A 12/05/2014   Procedure: LAPAROSCOPY, ATTEMPTED VAGINAL HYSTERECTOMY, TOTAL ABDOMINAL HYSTERECTOMY;  Surgeon: Margarette Asal, MD;  Location: [place];  Service: Gynecology;  Laterality: N/A;   NERVE REPAIR Right 03/08/2019   Procedure: EXPLORATION MEDIAN NERVE,  NERVE REPAIR WITH WRAP;  Surgeon: Leanora Cover, MD;  Location: [place];  Service: Orthopedics;  Laterality: Right;   Current Outpatient Medications on File Prior to Visit  Medication Sig Dispense Refill   Ascorbic Acid (VITAMIN C PO) Take by mouth. (Patient not taking: Reported on 01/14/2022)     tamsulosin (FLOMAX) 0.4 MG CAPS capsule TAKE 1 CAPSULE BY MOUTH EVERYDAY AT BEDTIME (Patient not taking: Reported on 01/14/2022) 90 capsule 0   No current facility-administered medications on file prior to visit.   No Known Allergies Social History   Socioeconomic History   Marital status: Divorced    Spouse name: Not on file   Number of children: 5   Years of education: Not on file   Highest education level: Not on file  Occupational History   Not on file  Tobacco Use   Smoking status: Never   Smokeless tobacco: Never  Vaping Use   Vaping Use: Never used  Substance and Sexual Activity   Alcohol use: No   Drug use: No   Sexual activity: Yes    Birth control/protection: Surgical    Comment: hysterectomy  Other Topics Concern   Not on file  Social History Narrative   ** Merged History Encounter ** Marital status: married x 17 years; currently separated as of 12/2021.  From [place]; moved to [place] 2000.   Children: 5 children; no grandchildren.    Living: husband, 5 children.   Employment: homemaker.   Social Determinants of Radio broadcast assistant  Strain: Low Risk  (01/14/2022)   Overall Financial Resource Strain (CARDIA)    Difficulty of Paying Living Expenses: Not very hard  Food Insecurity: No Food Insecurity (01/14/2022)   Hunger Vital Sign    Worried About Running Out of Food in the Last Year: Never true    Ran Out of Food in the Last Year: Never true  Transportation Needs: No Transportation Needs (01/14/2022)   PRAPARE - Hydrologist (Medical): No    Lack of Transportation (Non-Medical): No  Physical Activity: Sufficiently Active (01/14/2022)   Exercise Vital Sign    Days of Exercise per Week: 5  days    Minutes of Exercise per Session: 30 min  Stress: No Stress Concern Present (01/14/2022)   [place]    Feeling of Stress : Not at all  Social Connections: Moderately Integrated (01/14/2022)   Social Connection and Isolation Panel [NHANES]    Frequency of Communication with Friends and Family: More than three times a week    Frequency of Social Gatherings with Friends and Family: More than three times a week    Attends Religious Services: 1 to 4 times per year    Active Member of [employer] or Organizations: Yes    Attends Archivist Meetings: 1 to 4 times per year    Marital Status: Separated  Intimate Partner Violence: Not At Risk (01/14/2022)   Humiliation, Afraid, Rape, and Kick questionnaire    Fear of Current or Ex-Partner: No    Emotionally Abused: No    Physically Abused: No    Sexually Abused: No      Review of Systems  Neurological:  Positive for headaches.  All other systems reviewed and are negative.      Objective:   Physical Exam Constitutional:      Appearance: Normal appearance. She is normal weight.  HENT:     Right Ear: Tympanic membrane and ear canal normal.     Left Ear: Tympanic membrane and ear canal normal.     Mouth/Throat:     Pharynx: No oropharyngeal exudate or posterior oropharyngeal erythema.     Tonsils: No tonsillar exudate.  Cardiovascular:     Rate and Rhythm: Normal rate and regular rhythm.     Pulses:          Dorsalis pedis pulses are 2+ on the right side and 2+ on the left side.     Heart sounds: No murmur heard.    No friction rub. No gallop.  Pulmonary:     Effort: Pulmonary effort is normal. No respiratory distress.     Breath sounds: Normal breath sounds. No stridor. No wheezing or rhonchi.  Abdominal:     General: Abdomen is flat. Bowel sounds are normal.     Palpations: Abdomen is soft.  Musculoskeletal:     Right hand: Tenderness present.  Decreased range of motion. Decreased strength. Decreased sensation of the median distribution. There is disruption of two-point discrimination.     Right lower leg: Edema present.     Left lower leg: Edema present.     Left foot: Normal range of motion. No deformity.       Feet:  Neurological:     General: No focal deficit present.     Mental Status: She is alert and oriented to person, place, and time. Mental status is at baseline.     Cranial Nerves: No cranial nerve deficit.  Motor: No weakness.     Coordination: Coordination normal.     Gait: Gait normal.     Deep Tendon Reflexes: Reflexes normal.           Assessment & Plan:  Sore throat - Plan: CANCELED: Rapid strep screen (not at [facility])  Plantar fasciitis  Strep screen is negative.  I believe the patient likely has viral pharyngitis.  Recommended NSAIDs for pain and discomfort until spontaneous resolution occurs.  Recheck if no better in 2 to 3 days or immediately if worsening.  I believe that she also has plantar fasciitis.  Give the patient diclofenac 75 mg twice daily.  We also discussed stretches to perform at home and also recommended a night splint.  If the pain is not improving after 3 to 4 weeks, we could perform a cortisone injection in the foot.

## 2022-07-08 LAB — CULTURE, GROUP A STREP
MICRO NUMBER:: 13905289
SPECIMEN QUALITY:: ADEQUATE

## 2022-07-08 LAB — STREP GROUP A AG, W/REFLEX TO CULT: Streptococcus Group A AG: NOT DETECTED

## 2022-12-17 ENCOUNTER — Other Ambulatory Visit: Payer: Medicare Other

## 2022-12-17 DIAGNOSIS — R7309 Other abnormal glucose: Secondary | ICD-10-CM

## 2022-12-17 DIAGNOSIS — E669 Obesity, unspecified: Secondary | ICD-10-CM

## 2022-12-17 DIAGNOSIS — Z13220 Encounter for screening for lipoid disorders: Secondary | ICD-10-CM

## 2022-12-17 DIAGNOSIS — R5382 Chronic fatigue, unspecified: Secondary | ICD-10-CM

## 2022-12-17 DIAGNOSIS — D649 Anemia, unspecified: Secondary | ICD-10-CM

## 2022-12-17 DIAGNOSIS — R7989 Other specified abnormal findings of blood chemistry: Secondary | ICD-10-CM

## 2022-12-18 LAB — CBC WITH DIFFERENTIAL/PLATELET
Absolute Monocytes: 248 cells/uL (ref 200–950)
Basophils Absolute: 40 cells/uL (ref 0–200)
Basophils Relative: 0.6 %
Eosinophils Absolute: 147 cells/uL (ref 15–500)
Eosinophils Relative: 2.2 %
HCT: 40.3 % (ref 35.0–45.0)
Hemoglobin: 13.6 g/dL (ref 11.7–15.5)
Lymphs Abs: 2781 cells/uL (ref 850–3900)
MCH: 29.1 pg (ref 27.0–33.0)
MCHC: 33.7 g/dL (ref 32.0–36.0)
MCV: 86.3 fL (ref 80.0–100.0)
MPV: 11.2 fL (ref 7.5–12.5)
Monocytes Relative: 3.7 %
Neutro Abs: 3484 cells/uL (ref 1500–7800)
Neutrophils Relative %: 52 %
Platelets: 207 10*3/uL (ref 140–400)
RBC: 4.67 10*6/uL (ref 3.80–5.10)
RDW: 13.4 % (ref 11.0–15.0)
Total Lymphocyte: 41.5 %
WBC: 6.7 10*3/uL (ref 3.8–10.8)

## 2022-12-18 LAB — COMPLETE METABOLIC PANEL WITH GFR
AG Ratio: 1.6 (calc) (ref 1.0–2.5)
ALT: 60 U/L — ABNORMAL HIGH (ref 6–29)
AST: 29 U/L (ref 10–35)
Albumin: 4.4 g/dL (ref 3.6–5.1)
Alkaline phosphatase (APISO): 112 U/L (ref 31–125)
BUN: 13 mg/dL (ref 7–25)
CO2: 24 mmol/L (ref 20–32)
Calcium: 9.1 mg/dL (ref 8.6–10.2)
Chloride: 106 mmol/L (ref 98–110)
Creat: 0.69 mg/dL (ref 0.50–0.99)
Globulin: 2.8 g/dL (calc) (ref 1.9–3.7)
Glucose, Bld: 117 mg/dL — ABNORMAL HIGH (ref 65–99)
Potassium: 4.1 mmol/L (ref 3.5–5.3)
Sodium: 139 mmol/L (ref 135–146)
Total Bilirubin: 0.6 mg/dL (ref 0.2–1.2)
Total Protein: 7.2 g/dL (ref 6.1–8.1)
eGFR: 109 mL/min/{1.73_m2} (ref >=60)

## 2022-12-18 LAB — LIPID PANEL
Cholesterol: 207 mg/dL — ABNORMAL HIGH (ref <200)
HDL: 45 mg/dL — ABNORMAL LOW (ref >=50)
LDL Cholesterol (Calc): 105 mg/dL (calc) — ABNORMAL HIGH
Non-HDL Cholesterol (Calc): 162 mg/dL (calc) — ABNORMAL HIGH (ref <130)
Total CHOL/HDL Ratio: 4.6 (calc) (ref <5.0)
Triglycerides: 398 mg/dL — ABNORMAL HIGH (ref <150)

## 2022-12-18 LAB — HEMOGLOBIN A1C
Hgb A1c MFr Bld: 6.1 % of total Hgb — ABNORMAL HIGH (ref <5.7)
Mean Plasma Glucose: 128 mg/dL
eAG (mmol/L): 7.1 mmol/L

## 2022-12-23 ENCOUNTER — Ambulatory Visit (INDEPENDENT_AMBULATORY_CARE_PROVIDER_SITE_OTHER): Payer: Medicare Other | Admitting: Family Medicine

## 2022-12-23 ENCOUNTER — Encounter: Payer: Self-pay | Admitting: Family Medicine

## 2022-12-23 VITALS — BP 124/82 | HR 83 | Temp 98.3°F | Ht 61.0 in | Wt 211.8 lb

## 2022-12-23 DIAGNOSIS — Z1231 Encounter for screening mammogram for malignant neoplasm of breast: Secondary | ICD-10-CM

## 2022-12-23 DIAGNOSIS — M542 Cervicalgia: Secondary | ICD-10-CM

## 2022-12-23 DIAGNOSIS — Z1211 Encounter for screening for malignant neoplasm of colon: Secondary | ICD-10-CM

## 2022-12-23 DIAGNOSIS — R7309 Other abnormal glucose: Secondary | ICD-10-CM

## 2022-12-23 DIAGNOSIS — Z0000 Encounter for general adult medical examination without abnormal findings: Secondary | ICD-10-CM

## 2022-12-23 NOTE — Progress Notes (Signed)
Subjective:    Patient ID: Amy Aguilar, female    DOB: 03-Jun-1976, 47 y.o.   MRN: [id number]  Here for complete physical exam.  Has had a hysterectomy so she does not require Pap smear.  Is due for mammogram as well as colon cancer screening.  She prefers Cologuard.  She also reports right-sided neck pain that radiates into the right side of her face.  It causes frequent headaches.  It also radiates down her right shoulder and into her right arm and right forearm.  It is a burning shooting pain.  Previous doctors have told her it was a pinched nerve in her neck.  Her most recent lab work is listed below Lab on 12/17/2022  Component Date Value Ref Range Status   WBC 12/17/2022 6.7  3.8 - 10.8 Thousand/uL Final   RBC 12/17/2022 4.67  3.80 - 5.10 Million/uL Final   Hemoglobin 12/17/2022 13.6  11.7 - 15.5 g/dL Final   HCT 12/17/2022 40.3  35.0 - 45.0 % Final   MCV 12/17/2022 86.3  80.0 - 100.0 fL Final   MCH 12/17/2022 29.1  27.0 - 33.0 pg Final   MCHC 12/17/2022 33.7  32.0 - 36.0 g/dL Final   RDW 12/17/2022 13.4  11.0 - 15.0 % Final   Platelets 12/17/2022 207  140 - 400 Thousand/uL Final   MPV 12/17/2022 11.2  7.5 - 12.5 fL Final   Neutro Abs 12/17/2022 3,484  1,500 - 7,800 cells/uL Final   Lymphs Abs 12/17/2022 2,781  850 - 3,900 cells/uL Final   Absolute Monocytes 12/17/2022 248  200 - 950 cells/uL Final   Eosinophils Absolute 12/17/2022 147  15 - 500 cells/uL Final   Basophils Absolute 12/17/2022 40  0 - 200 cells/uL Final   Neutrophils Relative % 12/17/2022 52  % Final   Total Lymphocyte 12/17/2022 41.5  % Final   Monocytes Relative 12/17/2022 3.7  % Final   Eosinophils Relative 12/17/2022 2.2  % Final   Basophils Relative 12/17/2022 0.6  % Final   Glucose, Bld 12/17/2022 117 (H)  65 - 99 mg/dL Final   Comment: .            Fasting reference interval . For someone without known diabetes, a glucose value between 100 and 125 mg/dL is consistent with prediabetes and  should be confirmed with a follow-up test. .    BUN 12/17/2022 13  7 - 25 mg/dL Final   Creat 12/17/2022 0.69  0.50 - 0.99 mg/dL Final   eGFR 12/17/2022 109  > OR = 60 mL/min/1.41m Final   BUN/Creatinine Ratio 12/17/2022 SEE NOTE:  6 - 22 (calc) Final   Comment:    Not Reported: BUN and Creatinine are within    reference range. .    Sodium 12/17/2022 139  135 - 146 mmol/L Final   Potassium 12/17/2022 4.1  3.5 - 5.3 mmol/L Final   Chloride 12/17/2022 106  98 - 110 mmol/L Final   CO2 12/17/2022 24  20 - 32 mmol/L Final   Calcium 12/17/2022 9.1  8.6 - 10.2 mg/dL Final   Total Protein 12/17/2022 7.2  6.1 - 8.1 g/dL Final   Albumin 12/17/2022 4.4  3.6 - 5.1 g/dL Final   Globulin 12/17/2022 2.8  1.9 - 3.7 g/dL (calc) Final   AG Ratio 12/17/2022 1.6  1.0 - 2.5 (calc) Final   Total Bilirubin 12/17/2022 0.6  0.2 - 1.2 mg/dL Final   Alkaline phosphatase (APISO) 12/17/2022 112  31 - 125 U/L  Final   AST 12/17/2022 29  10 - 35 U/L Final   ALT 12/17/2022 60 (H)  6 - 29 U/L Final   Hgb A1c MFr Bld 12/17/2022 6.1 (H)  <5.7 % of total Hgb Final   Comment: For someone without known diabetes, a hemoglobin  A1c value between 5.7% and 6.4% is consistent with prediabetes and should be confirmed with a  follow-up test. . For someone with known diabetes, a value <7% indicates that their diabetes is well controlled. A1c targets should be individualized based on duration of diabetes, age, comorbid conditions, and other considerations. . This assay result is consistent with an increased risk of diabetes. . Currently, no consensus exists regarding use of hemoglobin A1c for diagnosis of diabetes for children. .    Mean Plasma Glucose 12/17/2022 128  mg/dL Final   eAG (mmol/L) 12/17/2022 7.1  mmol/L Final   Comment: . HbA1c performed on Roche platform. Effective 08/02/22 a change in test platforms may have  shifted HbA1c results compared to historical results.    Cholesterol 12/17/2022 207 (H)   <200 mg/dL Final   HDL 12/17/2022 45 (L)  > OR = 50 mg/dL Final   Triglycerides 12/17/2022 398 (H)  <150 mg/dL Final   Comment: . If a non-fasting specimen was collected, consider repeat triglyceride testing on a fasting specimen if clinically indicated.  Yates Decamp et al. J. of Clin. Lipidol. [id number]. Marland Kitchen    LDL Cholesterol (Calc) 12/17/2022 105 (H)  mg/dL (calc) Final   Comment: Reference range: <100 . Desirable range <100 mg/dL for primary prevention;   <70 mg/dL for patients with CHD or diabetic patients  with > or = 2 CHD risk factors. Marland Kitchen LDL-C is now calculated using the Martin-Hopkins  calculation, which is a validated novel method providing  better accuracy than the Friedewald equation in the  estimation of LDL-C.  Cresenciano Genre et al. Annamaria Helling. [id number]): 2061-2068  (http://education.[facility].com/faq/FAQ164)    Total CHOL/HDL Ratio 12/17/2022 4.6  <5.0 (calc) Final   Non-HDL Cholesterol (Calc) 12/17/2022 162 (H)  <130 mg/dL (calc) Final   Comment: For patients with diabetes plus 1 major ASCVD risk  factor, treating to a non-HDL-C goal of <100 mg/dL  (LDL-C of <70 mg/dL) is considered a therapeutic  option.      Past Medical History:  Diagnosis Date   Abnormal uterine bleeding (AUB)    Anemia    received blood transfusion 2 wks ago   Blood transfusion without reported diagnosis    Headache    migraines   Leiomyoma of uterus    Uterine fibroid    Past Surgical History:  Procedure Laterality Date   ABDOMINAL HYSTERECTOMY     BILATERAL SALPINGECTOMY Bilateral 12/05/2014   Procedure: BILATERAL SALPINGECTOMY;  Surgeon: Margarette Asal, MD;  Location: [facility];  Service: Gynecology;  Laterality: Bilateral;   CARPAL TUNNEL RELEASE Right 06/28/2018   Procedure: RIGHT CARPAL TUNNEL RELEASE;  Surgeon: Leandrew Koyanagi, MD;  Location: [facility];  Service: Orthopedics;  Laterality: Right;   LAPAROSCOPIC ASSISTED VAGINAL HYSTERECTOMY  N/A 12/05/2014   Procedure: LAPAROSCOPY, ATTEMPTED VAGINAL HYSTERECTOMY, TOTAL ABDOMINAL HYSTERECTOMY;  Surgeon: Margarette Asal, MD;  Location: [facility];  Service: Gynecology;  Laterality: N/A;   NERVE REPAIR Right 03/08/2019   Procedure: EXPLORATION MEDIAN NERVE,  NERVE REPAIR WITH WRAP;  Surgeon: Leanora Cover, MD;  Location: [facility];  Service: Orthopedics;  Laterality: Right;   No current outpatient medications on file prior  to visit.   No current facility-administered medications on file prior to visit.   No Known Allergies Social History   Socioeconomic History   Marital status: Divorced    Spouse name: Not on file   Number of children: 5   Years of education: Not on file   Highest education level: Not on file  Occupational History   Not on file  Tobacco Use   Smoking status: Never   Smokeless tobacco: Never  Vaping Use   Vaping Use: Never used  Substance and Sexual Activity   Alcohol use: No   Drug use: No   Sexual activity: Yes    Birth control/protection: Surgical    Comment: hysterectomy  Other Topics Concern   Not on file  Social History Narrative   ** Merged History Encounter ** Marital status: married x 17 years; currently separated as of 12/2021.  From [place]; moved to [place] 2000.   Children: 5 children; no grandchildren.    Living: husband, 5 children.   Employment: homemaker.   Social Determinants of Health   Financial Resource Strain: Low Risk  (01/14/2022)   Overall Financial Resource Strain (CARDIA)    Difficulty of Paying Living Expenses: Not very hard  Food Insecurity: No Food Insecurity (01/14/2022)   Hunger Vital Sign    Worried About Running Out of Food in the Last Year: Never true    Ran Out of Food in the Last Year: Never true  Transportation Needs: No Transportation Needs (01/14/2022)   PRAPARE - Hydrologist (Medical): No    Lack of Transportation (Non-Medical): No  Physical  Activity: Sufficiently Active (01/14/2022)   Exercise Vital Sign    Days of Exercise per Week: 5 days    Minutes of Exercise per Session: 30 min  Stress: No Stress Concern Present (01/14/2022)   [place]    Feeling of Stress : Not at all  Social Connections: Moderately Integrated (01/14/2022)   Social Connection and Isolation Panel [NHANES]    Frequency of Communication with Friends and Family: More than three times a week    Frequency of Social Gatherings with Friends and Family: More than three times a week    Attends Religious Services: 1 to 4 times per year    Active Member of [employer] or Organizations: Yes    Attends Archivist Meetings: 1 to 4 times per year    Marital Status: Separated  Intimate Partner Violence: Not At Risk (01/14/2022)   Humiliation, Afraid, Rape, and Kick questionnaire    Fear of Current or Ex-Partner: No    Emotionally Abused: No    Physically Abused: No    Sexually Abused: No      Review of Systems  All other systems reviewed and are negative.      Objective:   Physical Exam Constitutional:      Appearance: Normal appearance. She is normal weight.  Cardiovascular:     Rate and Rhythm: Normal rate and regular rhythm.     Heart sounds: No murmur heard.    No friction rub. No gallop.  Pulmonary:     Effort: Pulmonary effort is normal. No respiratory distress.     Breath sounds: Normal breath sounds. No stridor. No wheezing or rhonchi.  Abdominal:     General: Abdomen is flat. Bowel sounds are normal.     Palpations: Abdomen is soft.  Musculoskeletal:     Right lower leg: Edema  present.     Left lower leg: Edema present.  Neurological:     General: No focal deficit present.     Mental Status: She is alert and oriented to person, place, and time. Mental status is at baseline.     Cranial Nerves: No cranial nerve deficit.     Motor: No weakness.     Coordination:  Coordination normal.     Gait: Gait normal.     Deep Tendon Reflexes: Reflexes normal.           Assessment & Plan:  Encounter for screening mammogram for malignant neoplasm of breast - Plan: MM 3D SCREEN BREAST BILATERAL  Colon cancer screening - Plan: Cologuard  Neck pain - Plan: DG Cervical Spine Complete  General medical exam  Elevated glucose Schedule the patient for mammogram.  Schedule her for colon cancer screening with a Cologuard.  Pap smear is not necessary.  Reviewed her lab work in detail.  Recommended a low carbohydrate diet and 15 to 30 minutes of aerobic exercise every day.  Would like to see her lose 10 pounds over the next 3 to 6 months and then recheck fasting lab work.  I believe elevated liver function test is likely due to fatty liver disease and her dyslipidemia.  Recommended fish oil 2000 mg daily.  Obtain x-ray to evaluate for the cause of her cervical radiculopathy

## 2023-01-03 ENCOUNTER — Ambulatory Visit
Admission: RE | Admit: 2023-01-03 | Discharge: 2023-01-03 | Disposition: A | Discharge status: Home / Self Care | Payer: Medicare Other | Source: Ambulatory Visit | Attending: Family Medicine | Admitting: Family Medicine

## 2023-01-03 DIAGNOSIS — M542 Cervicalgia: Secondary | ICD-10-CM

## 2023-01-03 NOTE — Addendum Note (Signed)
Addended by: Jenna Luo T on: 01/03/2023 06:37 AM   Modules accepted: Level of Service

## 2023-01-18 LAB — COLOGUARD: COLOGUARD: NEGATIVE

## 2023-01-20 ENCOUNTER — Ambulatory Visit: Payer: Medicare Other

## 2023-01-20 VITALS — BP 122/80 | Ht 61.0 in | Wt 205.0 lb

## 2023-01-20 DIAGNOSIS — Z0000 Encounter for general adult medical examination without abnormal findings: Secondary | ICD-10-CM | POA: Diagnosis not present

## 2023-01-20 NOTE — Progress Notes (Signed)
Subjective:   Amy Aguilar is a 47 y.o. female who presents for Medicare Annual (Subsequent) preventive examination.  Review of Systems     Cardiac Risk Factors include: obesity (BMI >30kg/m2);sedentary lifestyle     Objective:    Today's Vitals   01/20/23 1448  Weight: 205 lb (93 kg)  Height: 5\' 1"  (1.549 m)   Body mass index is 38.73 kg/m.     01/20/2023    2:50 PM 01/14/2022    3:09 PM 03/08/2019    7:21 PM 03/08/2019    9:02 AM 03/01/2019    4:28 PM 12/27/2018   10:04 AM 06/28/2018    9:41 AM  Advanced Directives  Does Patient Have a Medical Advance Directive? No No No No No No No  Would patient like information on creating a medical advance directive? No - Patient declined No - Patient declined No - Patient declined No - Patient declined  Yes (MAU/Ambulatory/Procedural Areas - Information given) No - Patient declined    Current Medications (verified) No outpatient encounter medications on file as of 01/20/2023.   No facility-administered encounter medications on file as of 01/20/2023.    Allergies (verified) Patient has no known allergies.   History: Past Medical History:  Diagnosis Date   Abnormal uterine bleeding (AUB)    Anemia    received blood transfusion 2 wks ago   Blood transfusion without reported diagnosis    Headache    migraines   Leiomyoma of uterus    Uterine fibroid    Past Surgical History:  Procedure Laterality Date   ABDOMINAL HYSTERECTOMY     BILATERAL SALPINGECTOMY Bilateral 12/05/2014   Procedure: BILATERAL SALPINGECTOMY;  Surgeon: Margarette Asal, MD;  Location: [facility];  Service: Gynecology;  Laterality: Bilateral;   CARPAL TUNNEL RELEASE Right 06/28/2018   Procedure: RIGHT CARPAL TUNNEL RELEASE;  Surgeon: Leandrew Koyanagi, MD;  Location: [facility];  Service: Orthopedics;  Laterality: Right;   LAPAROSCOPIC ASSISTED VAGINAL HYSTERECTOMY N/A 12/05/2014   Procedure: LAPAROSCOPY, ATTEMPTED  VAGINAL HYSTERECTOMY, TOTAL ABDOMINAL HYSTERECTOMY;  Surgeon: Margarette Asal, MD;  Location: [facility];  Service: Gynecology;  Laterality: N/A;   NERVE REPAIR Right 03/08/2019   Procedure: EXPLORATION MEDIAN NERVE,  NERVE REPAIR WITH WRAP;  Surgeon: Leanora Cover, MD;  Location: [facility];  Service: Orthopedics;  Laterality: Right;   Family History  Problem Relation Age of Onset   Hypertension Father    Social History   Socioeconomic History   Marital status: Divorced    Spouse name: Not on file   Number of children: 5   Years of education: Not on file   Highest education level: Not on file  Occupational History   Not on file  Tobacco Use   Smoking status: Never   Smokeless tobacco: Never  Vaping Use   Vaping Use: Never used  Substance and Sexual Activity   Alcohol use: No   Drug use: No   Sexual activity: Yes    Birth control/protection: Surgical    Comment: hysterectomy  Other Topics Concern   Not on file  Social History Narrative   ** Merged History Encounter ** Marital status: married x 17 years; currently separated as of 12/2021.  From [place]; moved to [place] 2000.   Children: 5 children; no grandchildren.    Living: husband, 5 children.   Employment: homemaker.   Social Determinants of Health   Financial Resource Strain: Low Risk  (01/20/2023)   Overall  Financial Resource Strain (CARDIA)    Difficulty of Paying Living Expenses: Not hard at all  Food Insecurity: No Food Insecurity (01/20/2023)   Hunger Vital Sign    Worried About Running Out of Food in the Last Year: Never true    Ran Out of Food in the Last Year: Never true  Transportation Needs: No Transportation Needs (01/20/2023)   PRAPARE - Hydrologist (Medical): No    Lack of Transportation (Non-Medical): No  Physical Activity: Sufficiently Active (01/20/2023)   Exercise Vital Sign    Days of Exercise per Week: 5 days    Minutes of Exercise per  Session: 30 min  Stress: No Stress Concern Present (01/20/2023)   [place]    Feeling of Stress : Not at all  Social Connections: Moderately Integrated (01/20/2023)   Social Connection and Isolation Panel [NHANES]    Frequency of Communication with Friends and Family: More than three times a week    Frequency of Social Gatherings with Friends and Family: More than three times a week    Attends Religious Services: 1 to 4 times per year    Active Member of [employer] or Organizations: Yes    Attends Archivist Meetings: 1 to 4 times per year    Marital Status: Separated    Tobacco Counseling Counseling given: Not Answered   Clinical Intake:  Pre-visit preparation completed: Yes  Pain : No/denies pain     Diabetes: No  How often do you need to have someone help you when you read instructions, pamphlets, or other written materials from your doctor or pharmacy?: 1 - Never  Diabetic?No   Interpreter Needed?: No  Information entered by :: Denman George LPN   Activities of Daily Living    01/20/2023    2:50 PM  In your present state of health, do you have any difficulty performing the following activities:  Hearing? 0  Vision? 0  Difficulty concentrating or making decisions? 0  Walking or climbing stairs? 0  Dressing or bathing? 0  Doing errands, shopping? 0  Preparing Food and eating ? N  Using the Toilet? N  In the past six months, have you accidently leaked urine? N  Do you have problems with loss of bowel control? N  Managing your Medications? N  Managing your Finances? N  Housekeeping or managing your Housekeeping? N    Patient Care Team: Susy Frizzle, MD as PCP - General (Family Medicine)  Indicate any recent Medical Services you may have received from other than [facility] providers in the past year (date may be approximate).     Assessment:   This is a routine wellness examination for  [name].  Hearing/Vision screen Hearing Screening - Comments:: Denies hearing difficulties  Vision Screening - Comments:: No vision problems; will schedule routine eye exam soon    Dietary issues and exercise activities discussed: Current Exercise Habits: Home exercise routine, Type of exercise: walking, Time (Minutes): 30, Frequency (Times/Week): 5, Weekly Exercise (Minutes/Week): 150, Intensity: Mild   Goals Addressed   None   Depression Screen    01/20/2023    2:49 PM 12/23/2022    3:12 PM 07/06/2022    2:09 PM 01/14/2022    2:59 PM 05/14/2021    9:40 AM 03/25/2020    8:35 AM 09/25/2018    3:15 PM  PHQ 2/9 Scores  PHQ - 2 Score 0 0 0 0 0 0 0  PHQ- 9 Score      0 0    Fall Risk    01/20/2023    2:48 PM 12/23/2022    3:12 PM 01/14/2022    3:10 PM 05/14/2021    9:40 AM  Fall Risk   Falls in the past year? 0 0 0 0  Number falls in past yr: 0 0 0 0  Injury with Fall? 0 0 0 0  Risk for fall due to : No Fall Risks No Fall Risks Other (Comment) No Fall Risks  Follow up Falls prevention discussed;Education provided;Falls evaluation completed Falls prevention discussed Falls prevention discussed Falls evaluation completed    FALL RISK PREVENTION PERTAINING TO THE HOME:  Any stairs in or around the home? No  If so, are there any without handrails? No  Home free of loose throw rugs in walkways, pet beds, electrical cords, etc? Yes  Adequate lighting in your home to reduce risk of falls? Yes   ASSISTIVE DEVICES UTILIZED TO PREVENT FALLS:  Life alert? No  Use of a cane, walker or w/c? No  Grab bars in the bathroom? Yes  Shower chair or bench in shower? No  Elevated toilet seat or a handicapped toilet? No   TIMED UP AND GO:  Was the test performed? Yes .  Length of time to ambulate 10 feet: 5 sec.   Gait steady and fast without use of assistive device  Cognitive Function:        01/20/2023    2:50 PM 01/14/2022    3:13 PM  6CIT Screen  What Year? 0 points 0 points   What month? 0 points 0 points  What time? 0 points 0 points  Count back from 20 0 points 0 points  Months in reverse 0 points 0 points  Repeat phrase 0 points 0 points  Total Score 0 points 0 points    Immunizations Immunization History  Administered Date(s) Administered   PFIZER(Purple Top)SARS-COV-2 Vaccination 04/30/2020   Tdap 03/25/2020    TDAP status: Up to date  Flu Vaccine status: Declined, Education has been provided regarding the importance of this vaccine but patient still declined. Advised may receive this vaccine at local pharmacy or Health Dept. Aware to provide a copy of the vaccination record if obtained from local pharmacy or Health Dept. Verbalized acceptance and understanding.  Pneumococcal vaccine status: Up to date  Covid-19 vaccine status: Information provided on how to obtain vaccines.   Qualifies for Shingles Vaccine? No   Screening Tests Health Maintenance  Topic Date Due   INFLUENZA VACCINE  01/23/2023 (Originally 05/25/2022)   COVID-19 Vaccine (2 - 2023-24 season) 02/05/2023 (Originally 06/25/2022)   Medicare Annual Wellness (AWV)  01/20/2024   Fecal DNA (Cologuard)  01/11/2026   DTaP/Tdap/Td (2 - Td or Tdap) 03/25/2030   Hepatitis C Screening  Completed   HIV Screening  Completed   HPV VACCINES  Aged Out   PAP SMEAR-Modifier  Discontinued    Health Maintenance  There are no preventive care reminders to display for this patient.   Colorectal cancer screening: Type of screening: Cologuard. Completed 01/12/23. Repeat every 3 years  Mammogram status: Ordered and scheduled for 02/03/23. Pt provided with contact info and advised to call to schedule appt.   Lung Cancer Screening: (Low Dose CT Chest recommended if Age 60-80 years, 30 pack-year currently smoking OR have quit w/in 15years.) does not qualify.   Lung Cancer Screening Referral: n/a  Additional Screening:  Hepatitis C Screening: does qualify; Completed  01/26/21  Vision Screening:  Recommended annual ophthalmology exams for early detection of glaucoma and other disorders of the eye. Is the patient up to date with their annual eye exam?  No  Who is the provider or what is the name of the office in which the patient attends annual eye exams? none If pt is not established with a provider, would they like to be referred to a provider to establish care? No .   Dental Screening: Recommended annual dental exams for proper oral hygiene  Community Resource Referral / Chronic Care Management: CRR required this visit?  No   CCM required this visit?  No      Plan:     I have personally reviewed and noted the following in the patient's chart:   Medical and social history Use of alcohol, tobacco or illicit drugs  Current medications and supplements including opioid prescriptions. Patient is not currently taking opioid prescriptions. Functional ability and status Nutritional status Physical activity Advanced directives List of other physicians Hospitalizations, surgeries, and ER visits in previous 12 months Vitals Screenings to include cognitive, depression, and falls Referrals and appointments  In addition, I have reviewed and discussed with patient certain preventive protocols, quality metrics, and best practice recommendations. A written personalized care plan for preventive services as well as general preventive health recommendations were provided to patient.     Denman George [place], [place]   [id number]   Nurse Notes: No concerns

## 2023-01-20 NOTE — Patient Instructions (Addendum)
[name] , Thank you for taking time to come for your Medicare Wellness Visit. I appreciate your ongoing commitment to your health goals. Please review the following plan we discussed and let me know if I can assist you in the future.   These are the goals we discussed:  Goals      Exercise 3x per week (30 min per time)     Increase exercise and eat healthier.         This is a list of the screening recommended for you and due dates:  Health Maintenance  Topic Date Due   Flu Shot  01/23/2023*   COVID-19 Vaccine (2 - 2023-24 season) 02/05/2023*   Medicare Annual Wellness Visit  01/20/2024   Cologuard (Stool DNA test)  01/11/2026   DTaP/Tdap/Td vaccine (2 - Td or Tdap) 03/25/2030   Hepatitis C Screening: USPSTF Recommendation to screen - Ages 44-79 yo.  Completed   HIV Screening  Completed   HPV Vaccine  Aged Out   Pap Smear  Discontinued  *Topic was postponed. The date shown is not the original due date.    Advanced directives: Forms are available if you choose in the future to pursue completion.  This is recommended in order to make sure that your health wishes are honored in the event that you are unable to verbalize them to the provider.    Conditions/risks identified: Aim for 30 minutes of exercise or brisk walking, 6-8 glasses of water, and 5 servings of fruits and vegetables each day.  Next appointment: Follow up in one year for your annual wellness visit.   Preventive Care 40-64 Years, Female Preventive care refers to lifestyle choices and visits with your health care provider that can promote health and wellness. What does preventive care include? A yearly physical exam. This is also called an annual well check. Dental exams once or twice a year. Routine eye exams. Ask your health care provider how often you should have your eyes checked. Personal lifestyle choices, including: Daily care of your teeth and gums. Regular physical activity. Eating a healthy  diet. Avoiding tobacco and drug use. Limiting alcohol use. Practicing safe sex. Taking low-dose aspirin daily starting at age 37. Taking vitamin and mineral supplements as recommended by your health care provider. What happens during an annual well check? The services and screenings done by your health care provider during your annual well check will depend on your age, overall health, lifestyle risk factors, and family history of disease. Counseling  Your health care provider may ask you questions about your: Alcohol use. Tobacco use. Drug use. Emotional well-being. Home and relationship well-being. Sexual activity. Eating habits. Work and work Statistician. Method of birth control. Menstrual cycle. Pregnancy history. Screening  You may have the following tests or measurements: Height, weight, and BMI. Blood pressure. Lipid and cholesterol levels. These may be checked every 5 years, or more frequently if you are over 68 years old. Skin check. Lung cancer screening. You may have this screening every year starting at age 56 if you have a 30-pack-year history of smoking and currently smoke or have quit within the past 15 years. Fecal occult blood test (FOBT) of the stool. You may have this test every year starting at age 57. Flexible sigmoidoscopy or colonoscopy. You may have a sigmoidoscopy every 5 years or a colonoscopy every 10 years starting at age 71. Hepatitis C blood test. Hepatitis B blood test. Sexually transmitted disease (STD) testing. Diabetes screening. This is done  by checking your blood sugar (glucose) after you have not eaten for a while (fasting). You may have this done every 1-3 years. Mammogram. This may be done every 1-2 years. Talk to your health care provider about when you should start having regular mammograms. This may depend on whether you have a family history of breast cancer. BRCA-related cancer screening. This may be done if you have a family history of  breast, ovarian, tubal, or peritoneal cancers. Pelvic exam and Pap test. This may be done every 3 years starting at age 62. Starting at age 91, this may be done every 5 years if you have a Pap test in combination with an HPV test. Bone density scan. This is done to screen for osteoporosis. You may have this scan if you are at high risk for osteoporosis. Discuss your test results, treatment options, and if necessary, the need for more tests with your health care provider. Vaccines  Your health care provider may recommend certain vaccines, such as: Influenza vaccine. This is recommended every year. Tetanus, diphtheria, and acellular pertussis (Tdap, Td) vaccine. You may need a Td booster every 10 years. Zoster vaccine. You may need this after age 68. Pneumococcal 13-valent conjugate (PCV13) vaccine. You may need this if you have certain conditions and were not previously vaccinated. Pneumococcal polysaccharide (PPSV23) vaccine. You may need one or two doses if you smoke cigarettes or if you have certain conditions. Talk to your health care provider about which screenings and vaccines you need and how often you need them. This information is not intended to replace advice given to you by your health care provider. Make sure you discuss any questions you have with your health care provider. Document Released: 11/07/2015 Document Revised: 06/30/2016 Document Reviewed: 08/12/2015 Elsevier Interactive Patient Education  2017 [employer] Prevention in the Home Falls can cause injuries. They can happen to people of all ages. There are many things you can do to make your home safe and to help prevent falls. What can I do on the outside of my home? Regularly fix the edges of walkways and driveways and fix any cracks. Remove anything that might make you trip as you walk through a door, such as a raised step or threshold. Trim any bushes or trees on the path to your home. Use bright outdoor  lighting. Clear any walking paths of anything that might make someone trip, such as rocks or tools. Regularly check to see if handrails are loose or broken. Make sure that both sides of any steps have handrails. Any raised decks and porches should have guardrails on the edges. Have any leaves, snow, or ice cleared regularly. Use sand or salt on walking paths during winter. Clean up any spills in your garage right away. This includes oil or grease spills. What can I do in the bathroom? Use night lights. Install grab bars by the toilet and in the tub and shower. Do not use towel bars as grab bars. Use non-skid mats or decals in the tub or shower. If you need to sit down in the shower, use a plastic, non-slip stool. Keep the floor dry. Clean up any water that spills on the floor as soon as it happens. Remove soap buildup in the tub or shower regularly. Attach bath mats securely with double-sided non-slip rug tape. Do not have throw rugs and other things on the floor that can make you trip. What can I do in the bedroom? Use night lights.  Make sure that you have a light by your bed that is easy to reach. Do not use any sheets or blankets that are too big for your bed. They should not hang down onto the floor. Have a firm chair that has side arms. You can use this for support while you get dressed. Do not have throw rugs and other things on the floor that can make you trip. What can I do in the kitchen? Clean up any spills right away. Avoid walking on wet floors. Keep items that you use a lot in easy-to-reach places. If you need to reach something above you, use a strong step stool that has a grab bar. Keep electrical cords out of the way. Do not use floor polish or wax that makes floors slippery. If you must use wax, use non-skid floor wax. Do not have throw rugs and other things on the floor that can make you trip. What can I do with my stairs? Do not leave any items on the stairs. Make  sure that there are handrails on both sides of the stairs and use them. Fix handrails that are broken or loose. Make sure that handrails are as long as the stairways. Check any carpeting to make sure that it is firmly attached to the stairs. Fix any carpet that is loose or worn. Avoid having throw rugs at the top or bottom of the stairs. If you do have throw rugs, attach them to the floor with carpet tape. Make sure that you have a light switch at the top of the stairs and the bottom of the stairs. If you do not have them, ask someone to add them for you. What else can I do to help prevent falls? Wear shoes that: Do not have high heels. Have rubber bottoms. Are comfortable and fit you well. Are closed at the toe. Do not wear sandals. If you use a stepladder: Make sure that it is fully opened. Do not climb a closed stepladder. Make sure that both sides of the stepladder are locked into place. Ask someone to hold it for you, if possible. Clearly mark and make sure that you can see: Any grab bars or handrails. First and last steps. Where the edge of each step is. Use tools that help you move around (mobility aids) if they are needed. These include: Canes. Walkers. Scooters. Crutches. Turn on the lights when you go into a dark area. Replace any light bulbs as soon as they burn out. Set up your furniture so you have a clear path. Avoid moving your furniture around. If any of your floors are uneven, fix them. If there are any pets around you, be aware of where they are. Review your medicines with your doctor. Some medicines can make you feel dizzy. This can increase your chance of falling. Ask your doctor what other things that you can do to help prevent falls. This information is not intended to replace advice given to you by your health care provider. Make sure you discuss any questions you have with your health care provider. Document Released: 08/07/2009 Document Revised: 03/18/2016  Document Reviewed: 11/15/2014 Elsevier Interactive Patient Education  2017 [employer].

## 2023-02-03 ENCOUNTER — Ambulatory Visit
Admission: RE | Admit: 2023-02-03 | Discharge: 2023-02-03 | Disposition: A | Discharge status: Home / Self Care | Payer: Medicare Other | Source: Ambulatory Visit | Attending: Family Medicine | Admitting: Family Medicine

## 2023-02-03 DIAGNOSIS — Z1231 Encounter for screening mammogram for malignant neoplasm of breast: Secondary | ICD-10-CM

## 2023-05-05 ENCOUNTER — Ambulatory Visit (INDEPENDENT_AMBULATORY_CARE_PROVIDER_SITE_OTHER): Payer: Medicare Other | Admitting: Family Medicine

## 2023-05-05 ENCOUNTER — Encounter: Payer: Self-pay | Admitting: Family Medicine

## 2023-05-05 VITALS — BP 124/82 | HR 79 | Temp 98.1°F | Ht 61.0 in | Wt 215.0 lb

## 2023-05-05 DIAGNOSIS — J029 Acute pharyngitis, unspecified: Secondary | ICD-10-CM

## 2023-05-05 NOTE — Progress Notes (Signed)
Subjective:  HPI: Amy Aguilar is a 47 y.o. female presenting on 05/05/2023 for Follow-up (Sore throat/feels like can't breath at night and started 2 days ago. Also legs were swelling)   HPI Patient is in today for 2 days of sore throat that is worse at night and fatigue. Denies cough, congestion, fever, body aches, ear pain. She denies difficulty swallowing or breathing. Has tried nothing. No known sick exposures or home covid testing.  Review of Systems  All other systems reviewed and are negative.   Relevant past medical history reviewed and updated as indicated.   Past Medical History:  Diagnosis Date   Abnormal uterine bleeding (AUB)    Anemia    received blood transfusion 2 wks ago   Blood transfusion without reported diagnosis    Headache    migraines   Leiomyoma of uterus    Uterine fibroid      Past Surgical History:  Procedure Laterality Date   ABDOMINAL HYSTERECTOMY     BILATERAL SALPINGECTOMY Bilateral 12/05/2014   Procedure: BILATERAL SALPINGECTOMY;  Surgeon: Meriel Pica, MD;  Location: [facility];  Service: Gynecology;  Laterality: Bilateral;   CARPAL TUNNEL RELEASE Right 06/28/2018   Procedure: RIGHT CARPAL TUNNEL RELEASE;  Surgeon: Tarry Kos, MD;  Location: [facility];  Service: Orthopedics;  Laterality: Right;   LAPAROSCOPIC ASSISTED VAGINAL HYSTERECTOMY N/A 12/05/2014   Procedure: LAPAROSCOPY, ATTEMPTED VAGINAL HYSTERECTOMY, TOTAL ABDOMINAL HYSTERECTOMY;  Surgeon: Meriel Pica, MD;  Location: [facility];  Service: Gynecology;  Laterality: N/A;   NERVE REPAIR Right 03/08/2019   Procedure: EXPLORATION MEDIAN NERVE,  NERVE REPAIR WITH WRAP;  Surgeon: Betha Loa, MD;  Location:  S[facility];  Service: Orthopedics;  Laterality: Right;    Allergies and medications reviewed and updated.  No current outpatient medications on file.  No Known Allergies  Objective:   BP  124/82   Pulse 79   Temp 98.1 F (36.7 C) (Oral)   Ht 5\' 1"  (1.549 m)   Wt 215 lb (97.5 kg)   LMP 11/21/2014   SpO2 97%   BMI 40.62 kg/m      05/05/2023   10:08 AM 01/20/2023    2:48 PM 12/23/2022    3:08 PM  Vitals with BMI  Height 5\' 1"  5\' 1"  5\' 1"   Weight 215 lbs 205 lbs 211 lbs 13 oz  BMI 40.64 38.75 40.04  Systolic 124 122 161  Diastolic 82 80 82  Pulse 79  83     Physical Exam Vitals and nursing note reviewed.  Constitutional:      Appearance: Normal appearance. She is normal weight.  HENT:     Head: Normocephalic and atraumatic.     Nose: Nose normal.     Mouth/Throat:     Pharynx: Pharyngeal swelling present.  Cardiovascular:     Rate and Rhythm: Normal rate and regular rhythm.     Pulses: Normal pulses.     Heart sounds: Normal heart sounds.  Pulmonary:     Effort: Pulmonary effort is normal.     Breath sounds: Normal breath sounds.  Skin:    General: Skin is warm and dry.  Neurological:     General: No focal deficit present.     Mental Status: She is alert and oriented to person, place, and time. Mental status is at baseline.  Psychiatric:        Mood and Affect: Mood normal.  Behavior: Behavior normal.        Thought Content: Thought content normal.        Judgment: Judgment normal.     Assessment & Plan:  Acute pharyngitis, unspecified etiology Assessment & Plan: Patient here today with acute pharyngitis and fatigue. In house strep was negative, will obtain mono screen and urged to take covid test and report results to office if positive. May use NSAIDs or tylenol and chloraseptic spray for symptomatic management. Seek immediate medical care for difficulty swallowing, difficulty breathing, or acute worsening of symptoms. Return to office if symptoms persist or worsen.  Orders: -     Mononucleosis screen -     STREP GROUP A AG, W/REFLEX TO CULT     Follow up plan: Return if symptoms worsen or fail to improve.  Park Meo,  FNP

## 2023-05-05 NOTE — Assessment & Plan Note (Signed)
Patient here today with acute pharyngitis and fatigue. In house strep was negative, will obtain mono screen and urged to take covid test and report results to office if positive. May use NSAIDs or tylenol and chloraseptic spray for symptomatic management. Seek immediate medical care for difficulty swallowing, difficulty breathing, or acute worsening of symptoms. Return to office if symptoms persist or worsen.

## 2023-05-06 LAB — MONONUCLEOSIS SCREEN: Heterophile, Mono Screen: NEGATIVE

## 2023-05-07 LAB — CULTURE, GROUP A STREP
MICRO NUMBER:: 15187755
SPECIMEN QUALITY:: ADEQUATE

## 2023-05-07 LAB — STREP GROUP A AG, W/REFLEX TO CULT: Streptococcus Group A AG: NOT DETECTED

## 2023-05-13 ENCOUNTER — Other Ambulatory Visit: Payer: Medicare Other

## 2023-05-13 ENCOUNTER — Ambulatory Visit (INDEPENDENT_AMBULATORY_CARE_PROVIDER_SITE_OTHER): Payer: Medicare Other | Admitting: Family Medicine

## 2023-05-13 VITALS — BP 132/76 | HR 77 | Temp 98.0°F | Ht 61.0 in | Wt 211.4 lb

## 2023-05-13 DIAGNOSIS — M542 Cervicalgia: Secondary | ICD-10-CM | POA: Diagnosis not present

## 2023-05-13 DIAGNOSIS — I872 Venous insufficiency (chronic) (peripheral): Secondary | ICD-10-CM

## 2023-05-13 DIAGNOSIS — E7800 Pure hypercholesterolemia, unspecified: Secondary | ICD-10-CM

## 2023-05-13 DIAGNOSIS — M5412 Radiculopathy, cervical region: Secondary | ICD-10-CM | POA: Diagnosis not present

## 2023-05-13 MED ORDER — FUROSEMIDE 40 MG PO TABS: 40.0000 mg | ORAL_TABLET | Freq: Every day | ORAL | 3 refills | Status: DC | PRN

## 2023-05-13 NOTE — Progress Notes (Signed)
Subjective:    Patient ID: Amy Aguilar, female    DOB: 13-May-1976, 47 y.o.   MRN: [id number]  Headache     07/11/20 Patient is a very pleasant 47 year old Hispanic female who presents today complaining of pain in her right arm.  She states that the problems began in 2017.  She started a job and developed symptoms of carpal tunnel syndrome.  She originally had surgery on her right wrist for the carpal tunnel syndrome however after the surgery she lost all the feeling in the distribution of the median nerve specifically in her thumb and index finger.  She ultimately saw Dr. Betha Loa who performed a surgical revision.  His operative note reports repairing 3 fascicles of the median nerve.  However after the second surgery she continued to have symptoms of nerve palsy in her hand including numbness and tingling and burning pain in the distribution of the median nerve.  She was referred to a specialist at [facility]  They are planning a 3rd nerve conduction study next week.  She continues to have numbness and tingling in her right hand distal to the wrist in the distribution of the median nerve however she is now also having paresthesias and burning pain radiating up her right arm into her right shoulder and up the right side of her neck.  She reports stinging pain shooting up and down her right arm.  The pain is severe.  She is tearful when she describes it.  She states that she is depressed because her situation is not getting better and now she is having trouble sleeping due to the pain and she is unable to work.  At that time, my plan was: Patient has chronic right median neuropathy.  I am not sure if there was nerve injury after her first surgery or the exact cause however this has been persistent despite having two surgeries.  She has been referred to a third specialist and has a nerve conduction study pending.  However she is now having neuropathic pain radiating up and down her right  arm and into her right neck.  While this can still potentially be due to neurolysis of the right median nerve, it is possible she may also have an element of cervical radiculopathy.  She would like to try a prednisone taper pack in case she does have cervical radiculopathy.  She is tried and failed gabapentin in the past.  Recheck in 1 week after her nerve conduction studies to discuss further.  If nerve conduction studies confirm chronic right median neuropathy, we could try Lyrica versus nortriptyline for neuropathic pain in the right extremity.  07/18/20  Patient states that she had a nerve conduction test performed at her orthopedist office at [facility].  She was told that she has a neuroma causing pain radiating along the median nerve.  She was told that there was no evidence of cervical radiculopathy.  She has an appointment to see the hand specialist to discuss treatment options in October.  She is here today to discuss medication options to help manage neuropathic pain in the median nerve.  At that time, my plan was: Patient has tried and failed gabapentin.  Therefore we will try Lyrica 50 mg p.o. 3 times daily.  We will increase the dose further if the patient sees benefit and requires a higher dose.  Reassess via telephone in 1 week.  Await surgical consultation at [facility].  09/11/20 Patient states that she saw benefit from the Lyrica 50  mg 3 times a day.  It helped dull the pain in her right arm.  However when the medication ran out, she did not call back for refill because she did not realize that she could.  She has since seen the doctor at [facility].  Per the patient's report, she had an MRI that showed a slipped disc in her neck and they tried an epidural steroid injection in her neck to help with the pain radiating down her arm.  She states that the shot did not help.  She now reports severe right-sided headache.  She states the pain begins in the right side of her neck and then radiates up her  right occiput into her right temple and above her right eye.  She describes it as a pulsing sharp severe pain.  She states there is photophobia and nausea associated with it.  The pain comes and goes will.  Nothing seems to trigger the pain.  The only thing that she has found that makes the pain better is Excedrin Migraine.  However she denies any phonophobia.  She denies any aura or vision changes.  She denies any blurry vision.  She denies any dizziness.  Differential diagnosis includes cervicogenic headache due to cervical radiculopathy, chronic tension headache, or migraines unrelated to the nerve pain on the right side of her body.  However the patient states that the pain seems to radiate up and down her right arm into her right neck and up into her right temple.  It seems to be all tied together and is nervelike in nature.  At that time my plan was: At this point I am not certain of what is going on but the patient certainly seems to have neuropathic pain radiating down her right arm into her right hand.  Throughout her entire encounter today she is shaking her right hand like she is trying to wake it up.  She also reports nervelike pain radiating up her right neck into her right occiput and over her right temple.  It seems to be all tied together.  She has had MRIs of her neck and her head at [facility] per her report.  Therefore I have asked her to sign a release of information form so that I can get all the studies done at [facility] to see what nerve conduction studies and what MRIs have been performed.  Meanwhile I will put her back on Lyrica but at a higher dose to treat neuropathic pain 75 mg p.o. 3 times daily.  And then reassess in 1 month.  Patient did have a normal MRI of the brain in May 2019.  05/13/23 Patient continues to deal with chronic nerve pain in her right arm.  She states that she stopped physical therapy and they saw no benefit with the pain, strength, and weakness in her right arm.  She  continues to endorse pain radiating from her right arm into her right shoulder and down her right arm into her right hand.  This has been going on now for several years and has failed numerous attempts at conservative therapy as indicated above.  She is requesting an MRI of her neck.  She also reports hot flashes.  She has a history of a hysterectomy and therefore is not having monthly menstrual cycles.  However she reports hot flashes that occur at random and lasts for several minutes and then spontaneously subside.  She is interested today treatment options.  He also points to swelling in bilateral  hip.  She has trace to +1 pitting edema in both legs to the knee.  She also has a varicose vein which is quite large in her right calf that is been there for several years.  She has several smaller spider veins in both legs.  I believe the swelling is coming from chronic venous insufficiency  Past Medical History:  Diagnosis Date   Abnormal uterine bleeding (AUB)    Anemia    received blood transfusion 2 wks ago   Blood transfusion without reported diagnosis    Headache    migraines   Leiomyoma of uterus    Uterine fibroid    Past Surgical History:  Procedure Laterality Date   ABDOMINAL HYSTERECTOMY     BILATERAL SALPINGECTOMY Bilateral 12/05/2014   Procedure: BILATERAL SALPINGECTOMY;  Surgeon: Meriel Pica, MD;  [facility];  Service: Gynecology;  Laterality: Bilateral;   CARPAL TUNNEL RELEASE Right 06/28/2018   Procedure: RIGHT CARPAL TUNNEL RELEASE;  Surgeon: Tarry Kos, MD;  Location: [facility];  Service: Orthopedics;  Laterality: Right;   LAPAROSCOPIC ASSISTED VAGINAL HYSTERECTOMY N/A 12/05/2014   Procedure: LAPAROSCOPY, ATTEMPTED VAGINAL HYSTERECTOMY, TOTAL ABDOMINAL HYSTERECTOMY;  Surgeon: Meriel Pica, MD;  Location: [facility];  Service: Gynecology;  Laterality: N/A;   NERVE REPAIR Right 03/08/2019   Procedure: EXPLORATION  MEDIAN NERVE,  NERVE REPAIR WITH WRAP;  Surgeon: Betha Loa, MD;  Location:  SURGERY CENTER;  Service: Orthopedics;  Laterality: Right;   No current outpatient medications on file prior to visit.   No current facility-administered medications on file prior to visit.   No Known Allergies Social History   Socioeconomic History   Marital status: Divorced    Spouse name: Not on file   Number of children: 5   Years of education: Not on file   Highest education level: Not on file  Occupational History   Not on file  Tobacco Use   Smoking status: Never   Smokeless tobacco: Never  Vaping Use   Vaping status: Never Used  Substance and Sexual Activity   Alcohol use: No   Drug use: No   Sexual activity: Yes    Birth control/protection: Surgical    Comment: hysterectomy  Other Topics Concern   Not on file  Social History Narrative   ** Merged History Encounter ** Marital status: married x 17 years; currently separated as of 12/2021.  From [place]; moved to [place] 2000.   Children: 5 children; no grandchildren.    Living: husband, 5 children.   Employment: homemaker.   Social Determinants of Health   Financial Resource Strain: Low Risk  (01/20/2023)   Overall Financial Resource Strain (CARDIA)    Difficulty of Paying Living Expenses: Not hard at all  Food Insecurity: No Food Insecurity (01/20/2023)   Hunger Vital Sign    Worried About Running Out of Food in the Last Year: Never true    Ran Out of Food in the Last Year: Never true  Transportation Needs: No Transportation Needs (01/20/2023)   PRAPARE - Administrator, Civil Service (Medical): No    Lack of Transportation (Non-Medical): No  Physical Activity: Sufficiently Active (01/20/2023)   Exercise Vital Sign    Days of Exercise per Week: 5 days    Minutes of Exercise per Session: 30 min  Stress: No Stress Concern Present (01/20/2023)   [employer] of Occupational Health - Occupational Stress Questionnaire     Feeling of Stress :  Not at all  Social Connections: Moderately Integrated (01/20/2023)   Social Connection and Isolation Panel [NHANES]    Frequency of Communication with Friends and Family: More than three times a week    Frequency of Social Gatherings with Friends and Family: More than three times a week    Attends Religious Services: 1 to 4 times per year    Active Member of [employer] or Organizations: Yes    Attends Banker Meetings: 1 to 4 times per year    Marital Status: Separated  Intimate Partner Violence: Not At Risk (01/20/2023)   Humiliation, Afraid, Rape, and Kick questionnaire    Fear of Current or Ex-Partner: No    Emotionally Abused: No    Physically Abused: No    Sexually Abused: No      Review of Systems  Neurological:  Positive for headaches.  All other systems reviewed and are negative.      Objective:   Physical Exam Constitutional:      Appearance: Normal appearance. She is normal weight.  HENT:     Head:   Cardiovascular:     Rate and Rhythm: Normal rate and regular rhythm.     Heart sounds: Normal heart sounds.  Pulmonary:     Effort: Pulmonary effort is normal.     Breath sounds: Normal breath sounds.  Musculoskeletal:     Right wrist: Swelling and tenderness present. Decreased range of motion.     Right hand: Tenderness present. Decreased range of motion. Decreased strength. Decreased sensation of the median distribution. There is disruption of two-point discrimination.       Arms:       Hands:  Neurological:     Mental Status: She is alert.           Assessment & Plan:  Pure hypercholesterolemia - Plan: Lipid panel, COMPLETE METABOLIC PANEL WITH GFR  Neck pain  Cervical radiculopathy  Venous insufficiency X-rays of her neck in March were completely normal.  However the patient has nervelike pain radiating from the right side of her neck all the way into her right hand she has tried and failed several medications,  physical therapy.  At this point I recommended an MRI of the neck to determine if she has nerve impingement.  I will schedule this.  I believe the swelling in her legs is due to venous insufficiency.  I recommended 15 to 20 mm compression hose that are knee-high to be worn on a daily basis and to use Lasix 40 mg p.o. daily as needed leg swelling.  We discussed treatment options for vasomotor symptoms of perimenopause.  We discussed the increased risk of breast cancer and strokes with estrogen.  We also discussed [place].  At the present time the patient declines any treatment.  Return fasting to repeat fasting lipid panel given her hyperlipidemia seen in March.  She been trying to walk a mile every day.  She has been doing this now for 5 months.

## 2023-05-16 ENCOUNTER — Other Ambulatory Visit: Payer: Medicare Other

## 2023-05-17 ENCOUNTER — Other Ambulatory Visit: Payer: Self-pay

## 2023-05-17 LAB — LIPID PANEL
Cholesterol: 210 mg/dL — ABNORMAL HIGH (ref <200)
HDL: 41 mg/dL — ABNORMAL LOW (ref >=50)
Non-HDL Cholesterol (Calc): 169 mg/dL (calc) — ABNORMAL HIGH (ref <130)
Total CHOL/HDL Ratio: 5.1 (calc) — ABNORMAL HIGH (ref <5.0)
Triglycerides: 491 mg/dL — ABNORMAL HIGH (ref <150)

## 2023-05-17 LAB — COMPLETE METABOLIC PANEL WITH GFR
AG Ratio: 1.5 (calc) (ref 1.0–2.5)
ALT: 22 U/L (ref 6–29)
AST: 16 U/L (ref 10–35)
Albumin: 4.3 g/dL (ref 3.6–5.1)
Alkaline phosphatase (APISO): 121 U/L (ref 31–125)
BUN: 15 mg/dL (ref 7–25)
CO2: 25 mmol/L (ref 20–32)
Calcium: 9 mg/dL (ref 8.6–10.2)
Chloride: 104 mmol/L (ref 98–110)
Creat: 0.63 mg/dL (ref 0.50–0.99)
Globulin: 2.8 g/dL (calc) (ref 1.9–3.7)
Glucose, Bld: 103 mg/dL — ABNORMAL HIGH (ref 65–99)
Potassium: 4.2 mmol/L (ref 3.5–5.3)
Sodium: 137 mmol/L (ref 135–146)
Total Bilirubin: 0.7 mg/dL (ref 0.2–1.2)
Total Protein: 7.1 g/dL (ref 6.1–8.1)
eGFR: 111 mL/min/{1.73_m2} (ref >=60)

## 2023-05-17 MED ORDER — OMEGA-3-ACID ETHYL ESTERS 1 G PO CAPS: 2.0000 g | ORAL_CAPSULE | Freq: Every day | ORAL | 3 refills | Status: AC

## 2023-05-22 ENCOUNTER — Ambulatory Visit
Admission: RE | Admit: 2023-05-22 | Discharge: 2023-05-22 | Disposition: A | Discharge status: Home / Self Care | Payer: Medicare Other | Source: Ambulatory Visit | Attending: Family Medicine | Admitting: Family Medicine

## 2023-05-22 DIAGNOSIS — M542 Cervicalgia: Secondary | ICD-10-CM

## 2023-05-30 ENCOUNTER — Encounter: Payer: Self-pay | Admitting: Neurology

## 2023-05-30 ENCOUNTER — Other Ambulatory Visit: Payer: Self-pay

## 2023-05-30 DIAGNOSIS — M5412 Radiculopathy, cervical region: Secondary | ICD-10-CM

## 2023-05-30 DIAGNOSIS — M542 Cervicalgia: Secondary | ICD-10-CM

## 2023-06-01 ENCOUNTER — Ambulatory Visit (INDEPENDENT_AMBULATORY_CARE_PROVIDER_SITE_OTHER): Payer: Medicare Other | Admitting: Neurology

## 2023-06-01 ENCOUNTER — Encounter: Payer: Self-pay | Admitting: Neurology

## 2023-06-01 VITALS — BP 138/85 | HR 77 | Ht 62.0 in | Wt 210.2 lb

## 2023-06-01 DIAGNOSIS — G43709 Chronic migraine without aura, not intractable, without status migrainosus: Secondary | ICD-10-CM

## 2023-06-01 DIAGNOSIS — M542 Cervicalgia: Secondary | ICD-10-CM

## 2023-06-01 MED ORDER — CYCLOBENZAPRINE HCL 5 MG PO TABS: 5.0000 mg | ORAL_TABLET | Freq: Every evening | ORAL | 1 refills | Status: DC | PRN

## 2023-06-01 MED ORDER — TOPIRAMATE 25 MG PO TABS: 25.0000 mg | ORAL_TABLET | Freq: Every day | ORAL | 3 refills | Status: DC

## 2023-06-01 NOTE — Progress Notes (Unsigned)
[facility] Clinic Note - Initial Visit   Date: 06/01/2023   Amy Aguilar MRN: [id number] DOB: 1976/05/27   Dear Dr Tanya Nones:  Thank you for your kind referral of Amy Aguilar for consultation of headache. Although her history is well known to you, please allow [place] to reiterate it for the purpose of our medical record. The patient was accompanied to the clinic by daughter and granddaughter who also provides collateral information.     Amy Aguilar is a 47 y.o. 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   IMPRESSION: Chronic migraine headaches  - Start topiramate 25mg  daily.  Titrate as needed  - Recommend eye exam Cervicalgia - Start flexeril 5mg  at bedtime Chiari 1 malformation, mild.  I do not think she is symptomatic from this. Empty sella, likely incidental.  There is no papilledema on exam.  Headache characteristic is not typical for pseudotumor cerebri   Return to clinic in 2 months  ------------------------------------------------------------- History of present illness: She complains of headache involving the base of the neck, which started to radiate down her right arm.   She has a constant pressure.  Headaches occur 2-3 times per week, which lasts 2-days.  She has tried excedrin which helps her pain but gives her GI upset.  No benefit with tylenol.  She has tried physical therapy twice which has not helped.  She had MRI cervical spine which noted Chiari 1 malformation and empty sella, no evidence of stenosis or anything to explain her arm pain.   She reports having history of 4 surgeries on her right wrist due to carpal tunnel syndrome and complicated related to that.  She is followed at [facility] for her right hand.    Out-side paper records, electronic medical record, and images have been reviewed where available and summarized  as:  MRI cervical spine wo contrast 05/29/2023: 1. 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 2. Empty sella. While this finding is often incidental in nature and of no clinical significance, this can also be seen in the setting of idiopathic intracranial hypertension. 3. Otherwise unremarkable and normal MRI of the cervical spine. No significant disc pathology, stenosis, or evidence for neural impingement.  Lab Results  Component Value Date   HGBA1C 6.1 (H) 12/17/2022   Lab Results  Component Value Date   VITAMINB12 427 01/22/2021   Lab Results  Component Value Date   TSH 1.60 01/22/2021   No results found for: "ESRSEDRATE", "POCTSEDRATE"  Past Medical History:  Diagnosis Date   Abnormal uterine bleeding (AUB)    Anemia    received blood transfusion 2 wks ago   Blood transfusion without reported diagnosis    Headache    migraines   Leiomyoma of uterus    Uterine fibroid     Past Surgical History:  Procedure Laterality Date   ABDOMINAL HYSTERECTOMY     BILATERAL SALPINGECTOMY Bilateral 12/05/2014   Procedure: BILATERAL SALPINGECTOMY;  Surgeon: Meriel Pica, MD;  Location[facility];  Service: Gynecology;  Laterality: Bilateral;   CARPAL TUNNEL RELEASE Right 06/28/2018   Procedure: RIGHT CARPAL TUNNEL RELEASE;  Surgeon: Tarry Kos, MD;  Location: [facility];  Service: Orthopedics;  Laterality: Right;   LAPAROSCOPIC ASSISTED VAGINAL HYSTERECTOMY N/A 12/05/2014   Procedure: LAPAROSCOPY, ATTEMPTED VAGINAL HYSTERECTOMY, TOTAL ABDOMINAL HYSTERECTOMY;  Surgeon: Meriel Pica, MD;  Location: [facility]  ;  Service: Gynecology;  Laterality: N/A;   NERVE REPAIR Right 03/08/2019   Procedure: EXPLORATION MEDIAN NERVE,  NERVE REPAIR WITH WRAP;  Surgeon: Betha Loa, MD;  Location: [facility];  Service: Orthopedics;  Laterality: Right;     Medications:   Outpatient Encounter Medications as of 06/01/2023  Medication Sig   cyclobenzaprine (FLEXERIL) 5 MG tablet Take 1 tablet (5 mg total) by mouth at bedtime as needed for muscle spasms (neck pain).   furosemide (LASIX) 40 MG tablet Take 1 tablet (40 mg total) by mouth daily as needed for fluid.   omega-3 acid ethyl esters (LOVAZA) 1 g capsule Take 2 capsules (2 g total) by mouth daily.   topiramate (TOPAMAX) 25 MG tablet Take 1 tablet (25 mg total) by mouth daily.   No facility-administered encounter medications on file as of 06/01/2023.    Allergies: No Known Allergies  Family History: Family History  Problem Relation Age of Onset   Hypertension Father     Social History: Social History   Tobacco Use   Smoking status: Never   Smokeless tobacco: Never  Vaping Use   Vaping status: Never Used  Substance Use Topics   Alcohol use: No   Drug use: No   Social History   Social History Narrative   ** Merged History Encounter ** Marital status: married x 17 years; currently separated as of 12/2021.  From [place]; moved to [place] 2000.   Children: 5 children; no grandchildren.    Living: husband, 5 children.   Employment: homemaker.   Right handed    Vital Signs:  BP 138/85   Pulse 77   Ht 5\' 2"  (1.575 m)   Wt 210 lb 3.2 oz (95.3 kg)   LMP 11/21/2014   SpO2 96%   BMI 38.45 kg/m    Neurological Exam: MENTAL STATUS including orientation to time, place, person, recent and remote memory, attention span and concentration, language, and fund of knowledge is normal.  Speech is not dysarthric.  CRANIAL NERVES: II:  No visual field defects.   Normal fundoscopic exam bilaterally. III-IV-VI: Pupils equal round and reactive to light.  Normal conjugate, extra-ocular eye movements in all directions of gaze.  No nystagmus.  No ptosis.   V:  Normal facial sensation.    VII:  Normal facial symmetry and movements.   VIII:  Normal hearing and vestibular function.   IX-X:  Normal palatal  movement.   XI:  Normal shoulder shrug and head rotation.   XII:  Normal tongue strength and range of motion, no deviation or fasciculation.  MOTOR:  No atrophy, fasciculations or abnormal movements.  No pronator drift.   Upper Extremity:  Right  Left  Deltoid  5-/5   5/5   Biceps  5-/5   5/5   Triceps  5-/5   5/5   Wrist extensors  5-/5   5/5   Wrist flexors  5-/5   5/5   Finger extensors  5-/5   5/5   Finger flexors  5-/5   5/5   Dorsal interossei  5-/5   5/5   Abductor pollicis  5-/5   5/5   Tone (Ashworth scale)  0  0   Lower Extremity:  Right  Left  Hip flexors  5/5   5/5   Knee flexors  5/5   5/5   Knee extensors  5/5   5/5   Dorsiflexors  5/5   5/5   Plantarflexors  5/5  5/5   Toe extensors  5/5   5/5   Toe flexors  5/5   5/5   Tone (Ashworth scale)  0  0   MSRs:                                           Right        Left brachioradialis 2+  2+  biceps 2+  2+  triceps 2+  2+  patellar 2+  2+  ankle jerk 2+  2+  Hoffman no  no  plantar response down  down   SENSORY:  Normal and symmetric perception of light touch, pinprick, vibration, and temperature.    COORDINATION/GAIT: Normal finger-to- nose-finger.  Intact rapid alternating movements bilaterally.  Able to rise from a chair without using arms.  Gait narrow based and stable.   Thank you for allowing me to participate in patient's care.  If I can answer any additional questions, I would be pleased to do so.    Sincerely,    Yenifer Saccente K. Allena Katz, DO

## 2023-06-01 NOTE — Patient Instructions (Addendum)
Start topiramate 25mg  daily to prevent migraines  For neck pain, start flexeril 5mg  at bedtime  Recommend that you get your eyes checked  I will see you back in 2 months

## 2023-06-28 ENCOUNTER — Ambulatory Visit: Payer: Medicare Other | Admitting: Neurology

## 2023-08-01 ENCOUNTER — Ambulatory Visit: Payer: Medicare Other | Admitting: Neurology

## 2023-08-06 ENCOUNTER — Other Ambulatory Visit: Payer: Self-pay | Admitting: Family Medicine

## 2023-08-08 NOTE — Telephone Encounter (Signed)
Requested medications are due for refill today.  yes  Requested medications are on the active medications list.  yes  Last refill. 05/13/2023 #30 3 rf  Future visit scheduled.   no  Notes to clinic.  Missing Mg lab.    Requested Prescriptions  Pending Prescriptions Disp Refills   furosemide (LASIX) 40 MG tablet [Pharmacy Med Name: FUROSEMIDE 40 MG TABLET] 90 tablet 1    Sig: Take 1 tablet (40 mg total) by mouth daily as needed for fluid.     Cardiovascular:  Diuretics - Loop Failed - 08/06/2023  8:33 AM      Failed - Mg Level in normal range and within 180 days    No results found for: "MG"       Failed - Valid encounter within last 6 months    Recent Outpatient Visits           1 year ago Bruising   [facility] Pickard, Priscille Heidelberg, MD   1 year ago Left sided abdominal pain   [facility] Valentino Nose, NP   1 year ago Viral upper respiratory tract infection   [facility] Valentino Nose, NP   2 years ago Elevated LFTs   [facility] Donita Brooks, MD   2 years ago Chronic fatigue   [facility] Pickard, Priscille Heidelberg, MD       Future Appointments             In 3 months Pickard, Priscille Heidelberg, MD [facility]            Passed - K in normal range and within 180 days    Potassium  Date Value Ref Range Status  05/16/2023 4.2 3.5 - 5.3 mmol/L Final         Passed - Ca in normal range and within 180 days    Calcium  Date Value Ref Range Status  05/16/2023 9.0 8.6 - 10.2 mg/dL Final   Calcium, Ion  Date Value Ref Range Status  02/21/2018 1.05 (L) 1.15 - 1.40 mmol/L Final         Passed - Na in normal range and within 180 days    Sodium  Date Value Ref Range Status  05/16/2023 137 135 - 146 mmol/L Final         Passed - Cr in normal range and within 180 days    Creat  Date Value Ref Range Status  05/16/2023 0.63 0.50 - 0.99 mg/dL Final          Passed - Cl in normal range and within 180 days    Chloride  Date Value Ref Range Status  05/16/2023 104 98 - 110 mmol/L Final         Passed - Last BP in normal range    BP Readings from Last 1 Encounters:  06/01/23 138/85

## 2023-08-12 ENCOUNTER — Encounter: Payer: Self-pay | Admitting: Neurology

## 2023-08-12 ENCOUNTER — Ambulatory Visit (INDEPENDENT_AMBULATORY_CARE_PROVIDER_SITE_OTHER): Payer: Medicare Other | Admitting: Neurology

## 2023-08-12 VITALS — BP 125/86 | HR 74 | Ht 63.0 in | Wt 213.0 lb

## 2023-08-12 DIAGNOSIS — M542 Cervicalgia: Secondary | ICD-10-CM

## 2023-08-12 DIAGNOSIS — G43709 Chronic migraine without aura, not intractable, without status migrainosus: Secondary | ICD-10-CM

## 2023-08-12 MED ORDER — TOPIRAMATE 50 MG PO TABS: 50.0000 mg | ORAL_TABLET | Freq: Every day | ORAL | 5 refills | Status: DC

## 2023-08-12 NOTE — Progress Notes (Signed)
Follow-up Visit   Date: 08/12/2023    Amy Aguilar MRN: [id number] DOB: 02-25-1976    Amy Aguilar is a 47 y.o. 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  The patient was accompanied to the clinic by self.  IMPRESSION/PLAN: Chronic migraine headaches - Increase topiramate 50mg  daily Cervicalgia.  No evidence of nerve impingement on MRI cervical spine  - D/C flexeril as she did not tolerate it  - No benefit with PT Chiari 1 malformation, mild.  Asymptomatic. 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 She is scheduled to have eye exam next month  Return to clinic in 4 months  --------------------------------------------- History of present illness: She complains of headache involving the base of the neck, which started to radiate down her right arm.   She has a constant pressure.  Headaches occur 2-3 times per week, which lasts 2-days.  She has tried excedrin which helps her pain but gives her GI upset.  No benefit with tylenol.  She has tried physical therapy twice which has not helped.  She had MRI cervical spine which noted Chiari 1 malformation and empty sella, no evidence of stenosis or anything to explain her arm pain.   She reports having history of 4 surgeries on her right wrist due to carpal tunnel syndrome and complicated related to that.  She is followed at [facility] for her right hand.    UPDATE 08/12/2023:  She was started on topiramate 25mg  at her last visit, but she has not noticed any benefit.  She continues to get headaches 2-3 times per week and lasts 1-2 days.    Medications:  Current Outpatient Medications on File Prior to Visit  Medication Sig Dispense Refill   furosemide (LASIX) 40 MG tablet Take 1 tablet (40 mg total) by mouth daily as needed for fluid. 30 tablet 3   omega-3 acid ethyl esters (LOVAZA) 1 g capsule Take 2 capsules (2 g total) by  mouth daily. 180 capsule 3   No current facility-administered medications on file prior to visit.    Allergies: No Known Allergies  Vital Signs:  BP 125/86   Pulse 74   Ht 5\' 3"  (1.6 m)   Wt 213 lb (96.6 kg)   LMP 11/21/2014   SpO2 98%   BMI 37.73 kg/m     Neurological Exam: MENTAL STATUS including orientation to time, place, person, recent and remote memory, attention span and concentration, language, and fund of knowledge is normal.  Speech is not dysarthric.  CRANIAL NERVES:    Pupils equal round and reactive to light.  Normal conjugate, extra-ocular eye movements in all directions of gaze.  No ptosis.  Face is symmetric.   MOTOR:  Motor strength is 5/5 in all extremities, including right arm.  No atrophy, fasciculations or abnormal movements.  No pronator drift.  Tone is normal.    MSRs:  Reflexes are 2+/4 throughout.  SENSORY:  Intact to vibration throughout.  COORDINATION/GAIT:  Normal finger-to- nose-finger.  Intact rapid alternating movements bilaterally.  Gait narrow based and stable.   Data: MRI cervical spine wo contrast 05/29/2023: 1. 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 2. Empty sella. While this finding is often incidental in nature and of no clinical significance, this can also be seen in the setting of idiopathic intracranial hypertension. 3. Otherwise unremarkable and normal MRI of the cervical spine. No  significant disc pathology, stenosis, or evidence for neural impingement.   MRI brain wo contrast 02/22/2018:  Normal brain.   Thank you for allowing me to participate in patient's care.  If I can answer any additional questions, I would be pleased to do so.    Sincerely,    Arnitra Sokoloski K. Allena Katz, DO

## 2023-08-12 NOTE — Patient Instructions (Signed)
Increase topiramate to 50mg  daily

## 2023-11-17 ENCOUNTER — Ambulatory Visit (INDEPENDENT_AMBULATORY_CARE_PROVIDER_SITE_OTHER): Payer: Medicare Other | Admitting: Family Medicine

## 2023-11-17 ENCOUNTER — Encounter: Payer: Self-pay | Admitting: Family Medicine

## 2023-11-17 VITALS — BP 128/86 | HR 75 | Temp 97.9°F | Ht 63.0 in | Wt 217.8 lb

## 2023-11-17 DIAGNOSIS — E785 Hyperlipidemia, unspecified: Secondary | ICD-10-CM

## 2023-11-17 DIAGNOSIS — H538 Other visual disturbances: Secondary | ICD-10-CM

## 2023-11-17 DIAGNOSIS — G935 Compression of brain: Secondary | ICD-10-CM

## 2023-11-17 NOTE — Progress Notes (Signed)
Subjective:    Patient ID: Amy Aguilar, female    DOB: 07-14-1976, 48 y.o.   MRN: [id number] Patient is a very sweet 48 year old Hispanic female who presents today continuing to deal with headaches.  They are primarily located over her lower occiput particularly on the right side.  She also has nervelike pain radiating down her right arm.  This has been a chronic problem for her.  MRI obtained over the summer did show a Chiari malformation.  She has seen neurology and they started her on Topamax.  She is up to 50 mg a day.  This seems to be helping some but she continues to have daily headaches.  His headaches are also associated with blurry vision.  Neurology has recommended an ophthalmology consult which I will be happy to arrange.  The patient also has dyslipidemia.  She is due to recheck her cholesterol today.   Past Medical History:  Diagnosis Date   Abnormal uterine bleeding (AUB)    Anemia    received blood transfusion 2 wks ago   Blood transfusion without reported diagnosis    Headache    migraines   Leiomyoma of uterus    Uterine fibroid    Past Surgical History:  Procedure Laterality Date   ABDOMINAL HYSTERECTOMY     BILATERAL SALPINGECTOMY Bilateral 12/05/2014   Procedure: BILATERAL SALPINGECTOMY;  Surgeon: Meriel Pica, MD;  Location: [facility];  Service: Gynecology;  Laterality: Bilateral;   CARPAL TUNNEL RELEASE Right 06/28/2018   Procedure: RIGHT CARPAL TUNNEL RELEASE;  Surgeon: Tarry Kos, MD;  Location: [facility];  Service: Orthopedics;  Laterality: Right;   LAPAROSCOPIC ASSISTED VAGINAL HYSTERECTOMY N/A 12/05/2014   Procedure: LAPAROSCOPY, ATTEMPTED VAGINAL HYSTERECTOMY, TOTAL ABDOMINAL HYSTERECTOMY;  Surgeon: Meriel Pica, MD;  Location: [facility];  Service: Gynecology;  Laterality: N/A;   NERVE REPAIR Right 03/08/2019   Procedure: EXPLORATION MEDIAN NERVE,  NERVE REPAIR WITH WRAP;  Surgeon:  Betha Loa, MD;  Location: [facility];  Service: Orthopedics;  Laterality: Right;   Current Outpatient Medications on File Prior to Visit  Medication Sig Dispense Refill   furosemide (LASIX) 40 MG tablet Take 1 tablet (40 mg total) by mouth daily as needed for fluid. 30 tablet 3   omega-3 acid ethyl esters (LOVAZA) 1 g capsule Take 2 capsules (2 g total) by mouth daily. 180 capsule 3   topiramate (TOPAMAX) 50 MG tablet Take 1 tablet (50 mg total) by mouth daily. 30 tablet 5   No current facility-administered medications on file prior to visit.   No Known Allergies Social History   Socioeconomic History   Marital status: Divorced    Spouse name: Not on file   Number of children: 5   Years of education: Not on file   Highest education level: Not on file  Occupational History   Not on file  Tobacco Use   Smoking status: Never   Smokeless tobacco: Never  Vaping Use   Vaping status: Never Used  Substance and Sexual Activity   Alcohol use: No   Drug use: No   Sexual activity: Yes    Birth control/protection: Surgical    Comment: hysterectomy  Other Topics Concern   Not on file  Social History Narrative   ** Merged History Encounter ** Marital status: married x 17 years; currently separated as of 12/2021.  From [place]; moved to [place] 2000.   Children: 5 children; no grandchildren.    Living:  husband, 5 children.   Employment: homemaker.   Right handed   Social Drivers of Health   Financial Resource Strain: Low Risk  (01/20/2023)   Overall Financial Resource Strain (CARDIA)    Difficulty of Paying Living Expenses: Not hard at all  Food Insecurity: No Food Insecurity (01/20/2023)   Hunger Vital Sign    Worried About Running Out of Food in the Last Year: Never true    Ran Out of Food in the Last Year: Never true  Transportation Needs: No Transportation Needs (01/20/2023)   PRAPARE - Administrator, Civil Service (Medical): No    Lack of Transportation  (Non-Medical): No  Physical Activity: Sufficiently Active (01/20/2023)   Exercise Vital Sign    Days of Exercise per Week: 5 days    Minutes of Exercise per Session: 30 min  Stress: No Stress Concern Present (01/20/2023)   [employer] of Occupational Health - Occupational Stress Questionnaire    Feeling of Stress : Not at all  Social Connections: Moderately Integrated (01/20/2023)   Social Connection and Isolation Panel [NHANES]    Frequency of Communication with Friends and Family: More than three times a week    Frequency of Social Gatherings with Friends and Family: More than three times a week    Attends Religious Services: 1 to 4 times per year    Active Member of [employer] or Organizations: Yes    Attends Banker Meetings: 1 to 4 times per year    Marital Status: Separated  Intimate Partner Violence: Not At Risk (01/20/2023)   Humiliation, Afraid, Rape, and Kick questionnaire    Fear of Current or Ex-Partner: No    Emotionally Abused: No    Physically Abused: No    Sexually Abused: No      Review of Systems  All other systems reviewed and are negative.      Objective:   Physical Exam Constitutional:      Appearance: Normal appearance. She is normal weight.  Cardiovascular:     Rate and Rhythm: Normal rate and regular rhythm.     Heart sounds: Normal heart sounds.  Pulmonary:     Effort: Pulmonary effort is normal.     Breath sounds: Normal breath sounds.  Neurological:     Mental Status: She is alert.           Assessment & Plan:  Blurry vision - Plan: Ambulatory referral to Ophthalmology  Dyslipidemia - Plan: COMPLETE METABOLIC PANEL WITH GFR, Lipid panel  Chiari I malformation (HCC) I believe her headaches and her arm pain are made worse by her Chiari malformation.  We discussed seeing a neurosurgeon to discuss other options for treatment.  At the present time however, she would like to increase Topamax to 50 mg twice daily to see if this  will help.  While the patient is here today, I will arrange an ophthalmology consultation but I believe the blurry vision is most likely a migraine-like phenomenon triggered by the headaches.  Meanwhile check CMP and lipid panel.

## 2023-11-18 ENCOUNTER — Other Ambulatory Visit: Payer: Self-pay

## 2023-11-18 DIAGNOSIS — E7800 Pure hypercholesterolemia, unspecified: Secondary | ICD-10-CM

## 2023-11-18 LAB — COMPLETE METABOLIC PANEL WITH GFR
AG Ratio: 1.6 (calc) (ref 1.0–2.5)
ALT: 27 U/L (ref 6–29)
AST: 20 U/L (ref 10–35)
Albumin: 4.4 g/dL (ref 3.6–5.1)
Alkaline phosphatase (APISO): 126 U/L — ABNORMAL HIGH (ref 31–125)
BUN: 14 mg/dL (ref 7–25)
CO2: 23 mmol/L (ref 20–32)
Calcium: 8.9 mg/dL (ref 8.6–10.2)
Chloride: 106 mmol/L (ref 98–110)
Creat: 0.58 mg/dL (ref 0.50–0.99)
Globulin: 2.8 g/dL (ref 1.9–3.7)
Glucose, Bld: 104 mg/dL — ABNORMAL HIGH (ref 65–99)
Potassium: 4.1 mmol/L (ref 3.5–5.3)
Sodium: 139 mmol/L (ref 135–146)
Total Bilirubin: 0.7 mg/dL (ref 0.2–1.2)
Total Protein: 7.2 g/dL (ref 6.1–8.1)
eGFR: 113 mL/min/{1.73_m2} (ref >=60)

## 2023-11-18 LAB — LIPID PANEL
Cholesterol: 200 mg/dL — ABNORMAL HIGH (ref <200)
HDL: 48 mg/dL — ABNORMAL LOW (ref >=50)
LDL Cholesterol (Calc): 102 mg/dL — ABNORMAL HIGH
Non-HDL Cholesterol (Calc): 152 mg/dL — ABNORMAL HIGH (ref <130)
Total CHOL/HDL Ratio: 4.2 (calc) (ref <5.0)
Triglycerides: 374 mg/dL — ABNORMAL HIGH (ref <150)

## 2023-11-18 MED ORDER — FENOFIBRATE 145 MG PO TABS: 145.0000 mg | ORAL_TABLET | Freq: Every day | ORAL | 1 refills | Status: DC

## 2023-11-22 ENCOUNTER — Other Ambulatory Visit: Payer: Self-pay | Admitting: Neurology

## 2023-12-14 ENCOUNTER — Other Ambulatory Visit: Payer: Self-pay | Admitting: Family Medicine

## 2023-12-14 DIAGNOSIS — E7800 Pure hypercholesterolemia, unspecified: Secondary | ICD-10-CM

## 2023-12-15 NOTE — Telephone Encounter (Signed)
 Requested medication (s) are due for refill today: no  Requested medication (s) are on the active medication list: yes  Last refill:  11/18/23 #30 1 RF  Future visit scheduled: no  Notes to clinic:  pt was given a short supply of med. Was not sure if can refill/abnormal labs   Requested Prescriptions  Pending Prescriptions Disp Refills   fenofibrate (TRICOR) 145 MG tablet [Pharmacy Med Name: FENOFIBRATE 145 MG TABLET] 90 tablet 1    Sig: TAKE 1 TABLET BY MOUTH EVERY DAY     Cardiovascular:  Antilipid - Fibric Acid Derivatives Failed - 12/15/2023 11:03 AM      Failed - HGB in normal range and within 360 days    Hemoglobin  Date Value Ref Range Status  12/17/2022 13.6 11.7 - 15.5 g/dL Final         Failed - HCT in normal range and within 360 days    HCT  Date Value Ref Range Status  12/17/2022 40.3 35.0 - 45.0 % Final         Failed - PLT in normal range and within 360 days    Platelets  Date Value Ref Range Status  12/17/2022 207 140 - 400 Thousand/uL Final   Platelet Count, POC  Date Value Ref Range Status  08/08/2012 253 142 - 424 K/uL Final         Failed - WBC in normal range and within 360 days    WBC  Date Value Ref Range Status  12/17/2022 6.7 3.8 - 10.8 Thousand/uL Final         Failed - Valid encounter within last 12 months    Recent Outpatient Visits           1 year ago Bruising   [facility] Donita Brooks, MD   2 years ago Left sided abdominal pain   [facility] Valentino Nose, NP   2 years ago Viral upper respiratory tract infection   [facility] Valentino Nose, NP   2 years ago Elevated LFTs   [facility] Tanya Nones, Priscille Heidelberg, MD   2 years ago Chronic fatigue   [facility] Donita Brooks, MD              Failed - Lipid Panel in normal range within the last 12 months    Cholesterol  Date Value Ref Range Status  11/17/2023 200 (H) <200 mg/dL  Final   LDL Cholesterol (Calc)  Date Value Ref Range Status  11/17/2023 102 (H) mg/dL (calc) Final    Comment:    Reference range: <100 . Desirable range <100 mg/dL for primary prevention;   <70 mg/dL for patients with CHD or diabetic patients  with > or = 2 CHD risk factors. Marland Kitchen LDL-C is now calculated using the Martin-Hopkins  calculation, which is a validated novel method providing  better accuracy than the Friedewald equation in the  estimation of LDL-C.  Horald Pollen et al. Lenox Ahr. 1478;295(62): 2061-2068  (http://education.[facility].com/faq/FAQ164)    HDL  Date Value Ref Range Status  11/17/2023 48 (L) > OR = 50 mg/dL Final   Triglycerides  Date Value Ref Range Status  11/17/2023 374 (H) <150 mg/dL Final    Comment:    . If a non-fasting specimen was collected, consider repeat triglyceride testing on a fasting specimen if clinically indicated.  Perry Mount et al. J. of Clin. Lipidol. 2015;9:129-169. Marland Kitchen  Passed - ALT in normal range and within 360 days    ALT  Date Value Ref Range Status  11/17/2023 27 6 - 29 U/L Final         Passed - AST in normal range and within 360 days    AST  Date Value Ref Range Status  11/17/2023 20 10 - 35 U/L Final         Passed - Cr in normal range and within 360 days    Creat  Date Value Ref Range Status  11/17/2023 0.58 0.50 - 0.99 mg/dL Final         Passed - eGFR is 30 or above and within 360 days    GFR, Est African American  Date Value Ref Range Status  01/22/2021 136 > OR = 60 mL/min/1.22m2 Final   GFR, Est Non African American  Date Value Ref Range Status  01/22/2021 117 > OR = 60 mL/min/1.36m2 Final   eGFR  Date Value Ref Range Status  11/17/2023 113 > OR = 60 mL/min/1.92m2 Final

## 2024-01-09 ENCOUNTER — Encounter: Payer: Self-pay | Admitting: Neurology

## 2024-01-09 ENCOUNTER — Ambulatory Visit (INDEPENDENT_AMBULATORY_CARE_PROVIDER_SITE_OTHER): Payer: Medicare Other | Admitting: Neurology

## 2024-01-09 VITALS — BP 122/83 | HR 63 | Ht 63.0 in | Wt 213.0 lb

## 2024-01-09 DIAGNOSIS — M542 Cervicalgia: Secondary | ICD-10-CM

## 2024-01-09 DIAGNOSIS — G43709 Chronic migraine without aura, not intractable, without status migrainosus: Secondary | ICD-10-CM | POA: Diagnosis not present

## 2024-01-09 MED ORDER — TIZANIDINE HCL 2 MG PO TABS: 2.0000 mg | ORAL_TABLET | Freq: Every evening | ORAL | 3 refills | Status: AC | PRN

## 2024-01-09 MED ORDER — TOPIRAMATE 50 MG PO TABS: 50.0000 mg | ORAL_TABLET | Freq: Every day | ORAL | 1 refills | Status: DC

## 2024-01-09 NOTE — Progress Notes (Signed)
 Follow-up Visit   Date: 01/09/2024    [name] MRN: [id number] DOB: 02-22-1976    Amy Aguilar is a 48 y.o. 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  The patient was accompanied to the clinic by self.  IMPRESSION/PLAN: Chronic migraine headaches, improved on topiramate 50mg  daily, which will be continued.   Left sided cervicalgia.  No evidence of nerve impingement on MRI cervical spine  - Previously tried:  flexeril (intolerant), no benefit with PT  - Start tizanidine 2mg  at bedtime as needed  Chiari 1 malformation, mild.  Asymptomatic.  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 She is scheduled to have eye exam in May  Return to clinic in 6 months  --------------------------------------------- History of present illness: She complains of headache involving the base of the neck, which started to radiate down her right arm.   She has a constant pressure.  Headaches occur 2-3 times per week, which lasts 2-days.  She has tried excedrin which helps her pain but gives her GI upset.  No benefit with tylenol.  She has tried physical therapy twice which has not helped.  She had MRI cervical spine which noted Chiari 1 malformation and empty sella, no evidence of stenosis or anything to explain her arm pain.   She reports having history of 4 surgeries on her right wrist due to carpal tunnel syndrome and complicated related to that.  She is followed at [facility] for her right hand.    UPDATE 08/12/2023:  She was started on topiramate 25mg  at her last visit, but she has not noticed any benefit.  She continues to get headaches 2-3 times per week and lasts 1-2 days.    UPDATE 01/09/2024:  She is here for follow-up visit.  Topiramate was increased to 50mg  daily at her last visit.  Her headaches are much better and occur about once per month.  She continues to have left sided  neck and back pain.  No improvement with PT.   Medications:  Current Outpatient Medications on File Prior to Visit  Medication Sig Dispense Refill   fenofibrate (TRICOR) 145 MG tablet Take 1 tablet (145 mg total) by mouth daily. 30 tablet 1   omega-3 acid ethyl esters (LOVAZA) 1 g capsule Take 2 capsules (2 g total) by mouth daily. 180 capsule 3   No current facility-administered medications on file prior to visit.    Allergies: No Known Allergies  Vital Signs:  BP 122/83   Pulse 63   Ht 5\' 3"  (1.6 m)   Wt 213 lb (96.6 kg)   LMP 11/21/2014   SpO2 99%   BMI 37.73 kg/m     Neurological Exam: MENTAL STATUS including orientation to time, place, person, recent and remote memory, attention span and concentration, language, and fund of knowledge is normal.  Speech is not dysarthric.  CRANIAL NERVES:    Pupils equal round and reactive to light.  Normal conjugate, extra-ocular eye movements in all directions of gaze.  No ptosis.  Face is symmetric.   MOTOR:  Motor strength is 5/5 in all extremities, including right arm.  No atrophy, fasciculations or abnormal movements.  No pronator drift.  Tone is normal.    MSRs:  Reflexes are 2+/4 throughout.  SENSORY:  Intact to vibration throughout.  COORDINATION/GAIT:   Gait narrow based and stable.   Data: MRI cervical spine wo contrast 05/29/2023: 1. Mild Chiari 1 malformation with  the cerebellar tonsils extending up to 8 mm below the foramen magnum. 2. Empty sella. While this finding is often incidental in nature and of no clinical significance, this can also be seen in the setting of idiopathic intracranial hypertension. 3. Otherwise unremarkable and normal MRI of the cervical spine. No significant disc pathology, stenosis, or evidence for neural impingement.   MRI brain wo contrast 02/22/2018:  Normal brain.   Thank you for allowing me to participate in patient's care.  If I can answer any additional questions, I would be pleased to do so.     Sincerely,    Zayvian Mcmurtry K. Allena Katz, DO

## 2024-01-09 NOTE — Patient Instructions (Signed)
 Start tizanidine 2mg  at bedtime as needed  Continue topiramate 50mg  at bedtime

## 2024-01-25 ENCOUNTER — Other Ambulatory Visit: Payer: Self-pay | Admitting: Family Medicine

## 2024-01-25 DIAGNOSIS — E7800 Pure hypercholesterolemia, unspecified: Secondary | ICD-10-CM

## 2024-01-26 NOTE — Telephone Encounter (Signed)
 Requested medication (s) are due for refill today:   Yes  Requested medication (s) are on the active medication list:   Yes  Future visit scheduled:   Yes it looks like she has an appt today 01/26/2024 at 2:00.   LOV 11/17/2023 with Dr. Tanya Nones   Last ordered: 11/18/2023 #30, 1 refill.     Unable to refill because labs are due per protocol.   Requested Prescriptions  Pending Prescriptions Disp Refills   fenofibrate (TRICOR) 145 MG tablet [Pharmacy Med Name: FENOFIBRATE 145 MG TABLET] 30 tablet 1    Sig: TAKE 1 TABLET BY MOUTH EVERY DAY     Cardiovascular:  Antilipid - Fibric Acid Derivatives Failed - 01/26/2024  1:23 PM      Failed - HGB in normal range and within 360 days    Hemoglobin  Date Value Ref Range Status  12/17/2022 13.6 11.7 - 15.5 g/dL Final         Failed - HCT in normal range and within 360 days    HCT  Date Value Ref Range Status  12/17/2022 40.3 35.0 - 45.0 % Final         Failed - PLT in normal range and within 360 days    Platelets  Date Value Ref Range Status  12/17/2022 207 140 - 400 Thousand/uL Final   Platelet Count, POC  Date Value Ref Range Status  08/08/2012 253 142 - 424 K/uL Final         Failed - WBC in normal range and within 360 days    WBC  Date Value Ref Range Status  12/17/2022 6.7 3.8 - 10.8 Thousand/uL Final         Failed - Lipid Panel in normal range within the last 12 months    Cholesterol  Date Value Ref Range Status  11/17/2023 200 (H) <200 mg/dL Final   LDL Cholesterol (Calc)  Date Value Ref Range Status  11/17/2023 102 (H) mg/dL (calc) Final    Comment:    Reference range: <100 . Desirable range <100 mg/dL for primary prevention;   <70 mg/dL for patients with CHD or diabetic patients  with > or = 2 CHD risk factors. Marland Kitchen LDL-C is now calculated using the Martin-Hopkins  calculation, which is a validated novel method providing  better accuracy than the Friedewald equation in the  estimation of LDL-C.  Horald Pollen et al.  Lenox Ahr. 0981;191(47): 2061-2068  (http://education.[facility].com/faq/FAQ164)    HDL  Date Value Ref Range Status  11/17/2023 48 (L) > OR = 50 mg/dL Final   Triglycerides  Date Value Ref Range Status  11/17/2023 374 (H) <150 mg/dL Final    Comment:    . If a non-fasting specimen was collected, consider repeat triglyceride testing on a fasting specimen if clinically indicated.  Perry Mount et al. J. of Clin. Lipidol. 2015;9:129-169. Marland Kitchen          Passed - ALT in normal range and within 360 days    ALT  Date Value Ref Range Status  11/17/2023 27 6 - 29 U/L Final         Passed - AST in normal range and within 360 days    AST  Date Value Ref Range Status  11/17/2023 20 10 - 35 U/L Final         Passed - Cr in normal range and within 360 days    Creat  Date Value Ref Range Status  11/17/2023 0.58 0.50 - 0.99 mg/dL Final  Passed - eGFR is 30 or above and within 360 days    GFR, Est African American  Date Value Ref Range Status  01/22/2021 136 > OR = 60 mL/min/1.61m2 Final   GFR, Est Non African American  Date Value Ref Range Status  01/22/2021 117 > OR = 60 mL/min/1.65m2 Final   eGFR  Date Value Ref Range Status  11/17/2023 113 > OR = 60 mL/min/1.19m2 Final         Passed - Valid encounter within last 12 months    Recent Outpatient Visits           2 months ago Blurry vision   [place] [facility] Pickard, Priscille Heidelberg, MD   8 months ago Pure hypercholesterolemia   [facility] Donita Brooks, MD   8 months ago Acute pharyngitis, unspecified etiology   [place] [facility] Park Meo, FNP   1 year ago Encounter for screening mammogram for malignant neoplasm of breast   [facility] Pickard, Priscille Heidelberg, MD   1 year ago Sore throat   [place] [facility] Pickard, Priscille Heidelberg, MD

## 2024-01-27 ENCOUNTER — Encounter: Admitting: Family Medicine

## 2024-01-30 ENCOUNTER — Ambulatory Visit: Admitting: Family Medicine

## 2024-01-30 ENCOUNTER — Encounter: Payer: Self-pay | Admitting: Family Medicine

## 2024-01-30 VITALS — BP 136/88 | HR 78 | Temp 98.5°F | Ht 63.0 in | Wt 213.0 lb

## 2024-01-30 DIAGNOSIS — Z0001 Encounter for general adult medical examination with abnormal findings: Secondary | ICD-10-CM

## 2024-01-30 DIAGNOSIS — Z1231 Encounter for screening mammogram for malignant neoplasm of breast: Secondary | ICD-10-CM | POA: Insufficient documentation

## 2024-01-30 DIAGNOSIS — M7581 Other shoulder lesions, right shoulder: Secondary | ICD-10-CM | POA: Diagnosis not present

## 2024-01-30 DIAGNOSIS — G43109 Migraine with aura, not intractable, without status migrainosus: Secondary | ICD-10-CM

## 2024-01-30 DIAGNOSIS — Z0000 Encounter for general adult medical examination without abnormal findings: Secondary | ICD-10-CM

## 2024-01-30 NOTE — Progress Notes (Signed)
 Subjective:   Amy Aguilar is a 48 y.o. female who presents for Medicare Annual (Subsequent) preventive examination.  Visit Complete: In person  Patient Medicare AWV questionnaire was completed by the patient on N/A; I have confirmed that all information answered by patient is correct and no changes since this date.  Cardiac Risk Factors include: obesity (BMI >30kg/m2)     Objective:    Today's Vitals   01/30/24 1205 01/30/24 1207  BP:  136/88  Pulse:  78  Temp:  98.5 F (36.9 C)  SpO2:  99%  Weight: 213 lb (96.6 kg) 213 lb (96.6 kg)  Height: 5\' 3"  (1.6 m) 5\' 3"  (1.6 m)  PainSc:  3    Body mass index is 37.73 kg/m.     01/30/2024   12:27 PM 01/09/2024   10:13 AM 08/12/2023   10:50 AM 06/01/2023   10:11 AM 01/20/2023    2:50 PM 01/14/2022    3:09 PM 03/08/2019    7:21 PM  Advanced Directives  Does Patient Have a Medical Advance Directive? No No No No No No No  Would patient like information on creating a medical advance directive? No - Patient declined    No - Patient declined No - Patient declined No - Patient declined    Current Medications (verified) Outpatient Encounter Medications as of 01/30/2024  Medication Sig   fenofibrate (TRICOR) 145 MG tablet TAKE 1 TABLET BY MOUTH EVERY DAY   omega-3 acid ethyl esters (LOVAZA) 1 g capsule Take 2 capsules (2 g total) by mouth daily.   tiZANidine (ZANAFLEX) 2 MG tablet Take 1 tablet (2 mg total) by mouth at bedtime as needed for muscle spasms.   topiramate (TOPAMAX) 50 MG tablet Take 1 tablet (50 mg total) by mouth daily.   No facility-administered encounter medications on file as of 01/30/2024.    Allergies (verified) Patient has no known allergies.   History: Past Medical History:  Diagnosis Date   Abnormal uterine bleeding (AUB)    Anemia    received blood transfusion 2 wks ago   Blood transfusion without reported diagnosis    Headache    migraines   Leiomyoma of uterus    Uterine fibroid    Past  Surgical History:  Procedure Laterality Date   ABDOMINAL HYSTERECTOMY     BILATERAL SALPINGECTOMY Bilateral 12/05/2014   Procedure: BILATERAL SALPINGECTOMY;  Surgeon: Meriel Pica, MD;  Location: [facility];  Service: Gynecology;  Laterality: Bilateral;   CARPAL TUNNEL RELEASE Right 06/28/2018   Procedure: RIGHT CARPAL TUNNEL RELEASE;  Surgeon: Tarry Kos, MD;  Location: [facility];  Service: Orthopedics;  Laterality: Right;   LAPAROSCOPIC ASSISTED VAGINAL HYSTERECTOMY N/A 12/05/2014   Procedure: LAPAROSCOPY, ATTEMPTED VAGINAL HYSTERECTOMY, TOTAL ABDOMINAL HYSTERECTOMY;  Surgeon: Meriel Pica, MD;  Location: [facility];  Service: Gynecology;  Laterality: N/A;   NERVE REPAIR Right 03/08/2019   Procedure: EXPLORATION MEDIAN NERVE,  NERVE REPAIR WITH WRAP;  Surgeon: Betha Loa, MD;  Location: [facility];  Service: Orthopedics;  Laterality: Right;   Family History  Problem Relation Age of Onset   Hypertension Father    Social History   Socioeconomic History   Marital status: Divorced    Spouse name: Not on file   Number of children: 5   Years of education: Not on file   Highest education level: Not on file  Occupational History   Not on file  Tobacco Use   Smoking status:  Never   Smokeless tobacco: Never  Vaping Use   Vaping status: Never Used  Substance and Sexual Activity   Alcohol use: No   Drug use: No   Sexual activity: Yes    Birth control/protection: Surgical    Comment: hysterectomy  Other Topics Concern   Not on file  Social History Narrative   ** Merged History Encounter ** Marital status: married x 17 years; currently separated as of 12/2021.  From [place]; moved to [place] 2000.   Children: 5 children; no grandchildren.    Living: husband, 5 children.   Employment: homemaker.   Right handed   Social Drivers of Health   Financial Resource Strain: Medium Risk (01/30/2024)   Overall Financial  Resource Strain (CARDIA)    Difficulty of Paying Living Expenses: Somewhat hard  Food Insecurity: Food Insecurity Present (01/30/2024)   Hunger Vital Sign    Worried About Running Out of Food in the Last Year: Often true    Ran Out of Food in the Last Year: Often true  Transportation Needs: No Transportation Needs (01/30/2024)   PRAPARE - Administrator, Civil Service (Medical): No    Lack of Transportation (Non-Medical): No  Physical Activity: Insufficiently Active (01/30/2024)   Exercise Vital Sign    Days of Exercise per Week: 3 days    Minutes of Exercise per Session: 30 min  Stress: Stress Concern Present (01/30/2024)   [employer] of Occupational Health - Occupational Stress Questionnaire    Feeling of Stress : Rather much  Social Connections: Socially Isolated (01/30/2024)   Social Connection and Isolation Panel [NHANES]    Frequency of Communication with Friends and Family: More than three times a week    Frequency of Social Gatherings with Friends and Family: More than three times a week    Attends Religious Services: Never    Database administrator or Organizations: No    Attends Banker Meetings: Never    Marital Status: Separated    Tobacco Counseling Counseling given: Not Answered   Clinical Intake:  Pre-visit preparation completed: Yes  Pain : 0-10 (Wrist/shoulder (R), back) Pain Score: 3  (Wrist-3/4, back 5/6) Pain Type: Chronic pain Pain Location: Wrist (Wrist (R), left, Shoulder (R)) Pain Orientation: Right Pain Descriptors / Indicators: Burning, Discomfort Pain Onset: More than a month ago (Years ago- per pt) Pain Frequency: Several days a week Pain Relieving Factors: Massage Effect of Pain on Daily Activities: Has a negative effect. She has had 4 surgeries on her wrist so she is unable to work due to the pain.  Pain Relieving Factors: Massage  BMI - recorded: 37.73 Nutritional Status: BMI > 30  Obese Nutritional Risks:  None Diabetes: No  How often do you need to have someone help you when you read instructions, pamphlets, or other written materials from your doctor or pharmacy?: 1 - Never (Is able to read english) What is the last grade level you completed in school?: 11th grade in [place]  Interpreter Needed?: Yes Interpreter Agency: [place] Interpreter Name: Cato Mulligan Interpreter ID: No ID, employed through [employer] Patient Declined Interpreter : No Patient signed  waiver: No  Comments: Pt c/o of pain in R wrist, R shoulder and back. Pt states she is a single mother. She states that she does need help cleaning her house sometimes but her children help her. Information entered by :: Renee Pain, CMA   Activities of Daily Living    01/30/2024  12:17 PM  In your present state of health, do you have any difficulty performing the following activities:  Hearing? 0  Vision? 0  Difficulty concentrating or making decisions? 0  Walking or climbing stairs? 0  Dressing or bathing? 0  Doing errands, shopping? 1  Comment Has trouble carrying bags due to previous surgeries on R wrist  Preparing Food and eating ? N  Using the Toilet? N  In the past six months, have you accidently leaked urine? N  Do you have problems with loss of bowel control? N  Managing your Medications? N  Managing your Finances? N  Housekeeping or managing your Housekeeping? Y  Comment Pt states that her kids help her out around the house.    Patient Care Team: Donita Brooks, MD as PCP - General (Family Medicine) Glendale Chard, DO as Consulting Physician (Neurology)  Indicate any recent Medical Services you may have received from other than [facility] providers in the past year (date may be approximate).     Assessment:   This is a routine wellness examination for Karilynn.  Hearing/Vision screen No results found.   Goals Addressed               This Visit's Progress     Manage Pain  (pt-stated)        Pt would like to manage her pain and get back to working.       Depression Screen    01/30/2024   12:25 PM 11/17/2023    8:06 AM 01/20/2023    2:49 PM 12/23/2022    3:12 PM 07/06/2022    2:09 PM 01/14/2022    2:59 PM 05/14/2021    9:40 AM  PHQ 2/9 Scores  PHQ - 2 Score 4 0 0 0 0 0 0  PHQ- 9 Score 8          Fall Risk    01/30/2024   12:27 PM 01/09/2024   10:13 AM 11/17/2023    8:06 AM 08/12/2023   10:49 AM 06/01/2023   10:11 AM  Fall Risk   Falls in the past year? 0 0 0 0 0  Number falls in past yr: 0 0 0 0 0  Injury with Fall? 0 0 0 0 0  Risk for fall due to :   No Fall Risks    Follow up  Falls evaluation completed Falls prevention discussed Falls evaluation completed Falls evaluation completed    MEDICARE RISK AT HOME: Medicare Risk at Home Any stairs in or around the home?: No If so, are there any without handrails?: No Home free of loose throw rugs in walkways, pet beds, electrical cords, etc?: Yes Adequate lighting in your home to reduce risk of falls?: Yes Life alert?: No Use of a cane, walker or w/c?: No Grab bars in the bathroom?: No Shower chair or bench in shower?: No Elevated toilet seat or a handicapped toilet?: No  TIMED UP AND GO:  Was the test performed?  No    Cognitive Function:        01/30/2024   12:27 PM 01/20/2023    2:50 PM 01/14/2022    3:13 PM  6CIT Screen  What Year? 0 points 0 points 0 points  What month? 0 points 0 points 0 points  What time? 0 points 0 points 0 points  Count back from 20 0 points 0 points 0 points  Months in reverse 0 points 0 points 0 points  Repeat phrase 0 points  0 points 0 points  Total Score 0 points 0 points 0 points    Immunizations Immunization History  Administered Date(s) Administered   PFIZER(Purple Top)SARS-COV-2 Vaccination 04/30/2020   Tdap 03/25/2020    TDAP status: Up to date  Flu Vaccine status: Declined, Education has been provided regarding the importance of this vaccine but  patient still declined. Advised may receive this vaccine at local pharmacy or Health Dept. Aware to provide a copy of the vaccination record if obtained from local pharmacy or Health Dept. Verbalized acceptance and understanding.  Pneumococcal vaccine status: Declined,  Education has been provided regarding the importance of this vaccine but patient still declined. Advised may receive this vaccine at local pharmacy or Health Dept. Aware to provide a copy of the vaccination record if obtained from local pharmacy or Health Dept. Verbalized acceptance and understanding.   Covid-19 vaccine status: Declined, Education has been provided regarding the importance of this vaccine but patient still declined. Advised may receive this vaccine at local pharmacy or Health Dept.or vaccine clinic. Aware to provide a copy of the vaccination record if obtained from local pharmacy or Health Dept. Verbalized acceptance and understanding.  Qualifies for Shingles Vaccine? No   Zostavax completed No   Shingrix Completed?: No.    Education has been provided regarding the importance of this vaccine. Patient has been advised to call insurance company to determine out of pocket expense if they have not yet received this vaccine. Advised may also receive vaccine at local pharmacy or Health Dept. Verbalized acceptance and understanding.  Screening Tests Health Maintenance  Topic Date Due   COVID-19 Vaccine (2 - 2024-25 season) 06/26/2023   Cervical Cancer Screening (HPV/Pap Cotest)  11/16/2024 (Originally 08/22/2007)   INFLUENZA VACCINE  05/25/2024   Medicare Annual Wellness (AWV)  01/29/2025   Fecal DNA (Cologuard)  01/11/2026   DTaP/Tdap/Td (2 - Td or Tdap) 03/25/2030   Hepatitis C Screening  Completed   HIV Screening  Completed   HPV VACCINES  Aged Out    Health Maintenance  Health Maintenance Due  Topic Date Due   COVID-19 Vaccine (2 - 2024-25 season) 06/26/2023    Colorectal screening: Cologuard due on  01/11/2026  Mammogram status: Ordered 01/30/2024. Pt provided with contact info and advised to call to schedule appt.   Bone Density: N/A  Lung Cancer Screening: (Low Dose CT Chest recommended if Age 62-80 years, 20 pack-year currently smoking OR have quit w/in 15years.) does not qualify.   Lung Cancer Screening Referral: N/A   Additional Screening:  Hepatitis C Screening: does not qualify; Completed 01/26/2021  Vision Screening: Recommended annual ophthalmology exams for early detection of glaucoma and other disorders of the eye. Is the patient up to date with their annual eye exam?  No Who is the provider or what is the name of the office in which the patient attends annual eye exams? Dr Dione Booze- Pt has an appt in May If pt is not established with a provider, would they like to be referred to a provider to establish care? No .   Dental Screening: Recommended annual dental exams for proper oral hygiene  Diabetic Foot Exam: N/A  Community Resource Referral / Chronic Care Management: CRR required this visit?  No   CCM required this visit?  No     Plan:     I have personally reviewed and noted the following in the patient's chart:   Medical and social history Use of alcohol, tobacco or illicit drugs  Current medications  and supplements including opioid prescriptions. Patient is not currently taking opioid prescriptions. Functional ability and status Nutritional status Physical activity Advanced directives List of other physicians Hospitalizations, surgeries, and ER visits in previous 12 months Vitals Screenings to include cognitive, depression, and falls Referrals and appointments  In addition, I have reviewed and discussed with patient certain preventive protocols, quality metrics, and best practice recommendations. A written personalized care plan for preventive services as well as general preventive health recommendations were provided to patient.     Bernadette Hoit,  MD   01/30/2024   After Visit Summary: (In Person-Printed) AVS printed and given to the patient  Nurse Notes: It was a pleasure to assist you with your AWV.

## 2024-01-30 NOTE — Progress Notes (Addendum)
 Patient Office Visit  Assessment & Plan:  Encounter for Medicare annual wellness exam  Visit for screening mammogram -     3D Screening Mammogram, Left and Right; Future  Migraine with aura and without status migrainosus, not intractable  Rotator cuff tendonitis, right -     Ambulatory referral to Orthopedic Surgery   RTC 6 mos for follow up with PCP Recommend healthy diet i.e mediterranean/DASH diet, consistent exercise - 30 minutes 5 day per week, and gradual weight loss. Ortho consult ordered re ongoing worsening shoulder pain. Patient declined trying a different muscle relaxant 3 D [facility] [place] Return in 1 year (on 01/29/2025).   Subjective:    Patient ID: Amy Aguilar, female    DOB: [birth year]  Age: 48 y.o. MRN: [id number]  Chief Complaint  Patient presents with   Medicare Wellness    HPI Right shoulder pain for x 3 years which is worsening. Pt was evaluated re right sided headache and neck pain and had MRI cervical spine but never had any evaluation re shoulder pain. Pt is right hand dominant. Pt denies recent trauma. Pt did Physical therapy previously which did not help. Pt did have right carpal tunnel surgery in 2019 with complications and requiring subsequent surgeries. Patient continues to have wrist pain. Pt has been taking Zanaflex for upper back spasms but not helping. Pt also having right inferior scapular pain and spasms. Pt would like to see Ortho specialist re this.  Headaches- MRI revealed a Mild Chiari malformation for which she saw Neurology re this and placed on Topamax. Headaches overall have been well controlled.  Health maintenance- pt did cologuard 2024 but may consider colonoscopy next time. Patient has not had pap smear in a while due to having abdominal hysterectomy. She had both fallopian tubes removed. Pt not sure if she has her ovaries or not.   The 10-year ASCVD risk score (Arnett DK, et al., 2019) is:  1.2%  Past Medical History:  Diagnosis Date   Abnormal uterine bleeding (AUB)    Anemia    received blood transfusion 2 wks ago   Blood transfusion without reported diagnosis    Headache    migraines   Leiomyoma of uterus    Uterine fibroid    Past Surgical History:  Procedure Laterality Date   ABDOMINAL HYSTERECTOMY     BILATERAL SALPINGECTOMY Bilateral 12/05/2014   Procedure: BILATERAL SALPINGECTOMY;  Surgeon: Meriel Pica, MD;  Location: [facility];  Service: Gynecology;  Laterality: Bilateral;   CARPAL TUNNEL RELEASE Right 06/28/2018   Procedure: RIGHT CARPAL TUNNEL RELEASE;  Surgeon: Tarry Kos, MD;  Location: [facility];  Service: Orthopedics;  Laterality: Right;   LAPAROSCOPIC ASSISTED VAGINAL HYSTERECTOMY N/A 12/05/2014   Procedure: LAPAROSCOPY, ATTEMPTED VAGINAL HYSTERECTOMY, TOTAL ABDOMINAL HYSTERECTOMY;  Surgeon: Meriel Pica, MD;  Location: [facility];  Service: Gynecology;  Laterality: N/A;   NERVE REPAIR Right 03/08/2019   Procedure: EXPLORATION MEDIAN NERVE,  NERVE REPAIR WITH WRAP;  Surgeon: Betha Loa, MD;  Location: [facility];  Service: Orthopedics;  Laterality: Right;   Social History   Tobacco Use   Smoking status: Never   Smokeless tobacco: Never  Vaping Use   Vaping status: Never Used  Substance Use Topics   Alcohol use: No   Drug use: No   Family History  Problem Relation Age of Onset   Hypertension Father    No Known Allergies  ROS  Objective:    BP 136/88   Pulse 78   Temp 98.5 F (36.9 C)   Ht 5\' 3"  (1.6 m)   Wt 213 lb (96.6 kg)   LMP 11/21/2014   SpO2 99%   BMI 37.73 kg/m  BP Readings from Last 3 Encounters:  01/30/24 136/88  01/09/24 122/83  11/17/23 128/86   Wt Readings from Last 3 Encounters:  01/30/24 213 lb (96.6 kg)  01/09/24 213 lb (96.6 kg)  11/17/23 217 lb 12.8 oz (98.8 kg)    Physical Exam Vitals and nursing note reviewed.   Constitutional:      Appearance: Normal appearance.  HENT:     Head: Normocephalic.     Right Ear: Tympanic membrane, ear canal and external ear normal.     Left Ear: Tympanic membrane, ear canal and external ear normal.  Eyes:     Extraocular Movements: Extraocular movements intact.     Pupils: Pupils are equal, round, and reactive to light.  Cardiovascular:     Rate and Rhythm: Normal rate and regular rhythm.     Heart sounds: Normal heart sounds.  Pulmonary:     Effort: Pulmonary effort is normal.     Breath sounds: Normal breath sounds. No wheezing.  Musculoskeletal:     Right shoulder: Tenderness and bony tenderness present. Decreased range of motion.     Left shoulder: Normal.     Right lower leg: No edema.     Left lower leg: No edema.     Comments: Pt has tenderness over inferior aspect of scapula. Palpable spasm.   Neurological:     General: No focal deficit present.     Mental Status: She is alert and oriented to person, place, and time.  Psychiatric:        Mood and Affect: Mood normal.        Behavior: Behavior normal.      No results found for any visits on 01/30/24.

## 2024-02-06 ENCOUNTER — Ambulatory Visit
Admission: RE | Admit: 2024-02-06 | Discharge: 2024-02-06 | Disposition: A | Discharge status: Home / Self Care | Source: Ambulatory Visit | Attending: Family Medicine | Admitting: Family Medicine

## 2024-02-06 DIAGNOSIS — Z1231 Encounter for screening mammogram for malignant neoplasm of breast: Secondary | ICD-10-CM

## 2024-02-10 ENCOUNTER — Other Ambulatory Visit: Payer: Self-pay | Admitting: Family Medicine

## 2024-02-10 DIAGNOSIS — E7800 Pure hypercholesterolemia, unspecified: Secondary | ICD-10-CM

## 2024-02-10 DIAGNOSIS — R928 Other abnormal and inconclusive findings on diagnostic imaging of breast: Secondary | ICD-10-CM

## 2024-02-10 NOTE — Telephone Encounter (Signed)
 Called pharmacy. Pt has refills available, they will prepare now.

## 2024-02-10 NOTE — Telephone Encounter (Signed)
 Prescription Request  02/10/2024  LOV: 11/17/2023  What is the name of the medication or equipment? fenofibrate  (TRICOR ) 145 MG tablet    Have you contacted your pharmacy to request a refill? Yes Patient requesting a 90 day supply  Which pharmacy would you like this sent to?  [id number], [place] - [address] [place] [address] Phone: [phone] Fax: [phone]    Patient notified that their request is being sent to the clinical staff for review and that they should receive a response within 2 business days.   Please advise at [facility] [phone]

## 2024-02-10 NOTE — Telephone Encounter (Signed)
 Requested Prescriptions  Refused Prescriptions Disp Refills   fenofibrate  (TRICOR ) 145 MG tablet 30 tablet 1    Sig: Take 1 tablet (145 mg total) by mouth daily.     Cardiovascular:  Antilipid - Fibric Acid Derivatives Failed - 02/10/2024  2:33 PM      Failed - HGB in normal range and within 360 days    Hemoglobin  Date Value Ref Range Status  12/17/2022 13.6 11.7 - 15.5 g/dL Final         Failed - HCT in normal range and within 360 days    HCT  Date Value Ref Range Status  12/17/2022 40.3 35.0 - 45.0 % Final         Failed - PLT in normal range and within 360 days    Platelets  Date Value Ref Range Status  12/17/2022 207 140 - 400 Thousand/uL Final   Platelet Count, POC  Date Value Ref Range Status  08/08/2012 253 142 - 424 K/uL Final         Failed - WBC in normal range and within 360 days    WBC  Date Value Ref Range Status  12/17/2022 6.7 3.8 - 10.8 Thousand/uL Final         Failed - Lipid Panel in normal range within the last 12 months    Cholesterol  Date Value Ref Range Status  11/17/2023 200 (H) <200 mg/dL Final   LDL Cholesterol (Calc)  Date Value Ref Range Status  11/17/2023 102 (H) mg/dL (calc) Final    Comment:    Reference range: <100 . Desirable range <100 mg/dL for primary prevention;   <70 mg/dL for patients with CHD or diabetic patients  with > or = 2 CHD risk factors. Aaron Aas LDL-C is now calculated using the Martin-Hopkins  calculation, which is a validated novel method providing  better accuracy than the Friedewald equation in the  estimation of LDL-C.  Melinda Sprawls et al. Erroll Heard. 2956;213(08): 2061-2068  (http://education.[facility].com/faq/FAQ164)    HDL  Date Value Ref Range Status  11/17/2023 48 (L) > OR = 50 mg/dL Final   Triglycerides  Date Value Ref Range Status  11/17/2023 374 (H) <150 mg/dL Final    Comment:    . If a non-fasting specimen was collected, consider repeat triglyceride testing on a fasting specimen if clinically  indicated.  Imagene Mam et al. J. of Clin. Lipidol. 2015;9:129-169. Aaron Aas          Passed - ALT in normal range and within 360 days    ALT  Date Value Ref Range Status  11/17/2023 27 6 - 29 U/L Final         Passed - AST in normal range and within 360 days    AST  Date Value Ref Range Status  11/17/2023 20 10 - 35 U/L Final         Passed - Cr in normal range and within 360 days    Creat  Date Value Ref Range Status  11/17/2023 0.58 0.50 - 0.99 mg/dL Final         Passed - eGFR is 30 or above and within 360 days    GFR, Est African American  Date Value Ref Range Status  01/22/2021 136 > OR = 60 mL/min/1.63m2 Final   GFR, Est Non African American  Date Value Ref Range Status  01/22/2021 117 > OR = 60 mL/min/1.74m2 Final   eGFR  Date Value Ref Range Status  11/17/2023 113 > OR = 60  mL/min/1.6m2 Final         Passed - Valid encounter within last 12 months    Recent Outpatient Visits           1 week ago Encounter for Medicare annual wellness exam   [facility] Amadeo June, MD   2 months ago Blurry vision   [facility] Cheril Cork, Cisco Crest, MD   9 months ago Pure hypercholesterolemia   [facility] Austine Lefort, MD   9 months ago Acute pharyngitis, unspecified etiology   [facility] Jenelle Mis, FNP   1 year ago Encounter for screening mammogram for malignant neoplasm of breast    [facility] Pickard, Cisco Crest, MD

## 2024-02-12 ENCOUNTER — Encounter: Payer: Self-pay | Admitting: Family Medicine

## 2024-02-14 ENCOUNTER — Other Ambulatory Visit: Payer: Self-pay

## 2024-02-14 DIAGNOSIS — E7800 Pure hypercholesterolemia, unspecified: Secondary | ICD-10-CM

## 2024-02-14 NOTE — Telephone Encounter (Signed)
 Prescription Request  02/14/2024  LOV: 01/30/24  What is the name of the medication or equipment? fenofibrate  (TRICOR ) 145 MG tablet [[id number]]   Have you contacted your pharmacy to request a refill? Yes   Which pharmacy would you like this sent to?  [id number], [place] - [address] [place] [address] Phone: [phone] Fax: [phone]    Patient notified that their request is being sent to the clinical staff for review and that they should receive a response within 2 business days.   Please advise at [facility] [phone]

## 2024-02-21 ENCOUNTER — Encounter

## 2024-02-21 ENCOUNTER — Other Ambulatory Visit

## 2024-03-06 ENCOUNTER — Ambulatory Visit
Admission: RE | Admit: 2024-03-06 | Discharge: 2024-03-06 | Disposition: A | Discharge status: Home / Self Care | Source: Ambulatory Visit | Attending: Family Medicine | Admitting: Family Medicine

## 2024-03-06 DIAGNOSIS — R928 Other abnormal and inconclusive findings on diagnostic imaging of breast: Secondary | ICD-10-CM

## 2024-03-07 LAB — HM DIABETES EYE EXAM

## 2024-04-08 ENCOUNTER — Other Ambulatory Visit: Payer: Self-pay | Admitting: Neurology

## 2024-04-30 ENCOUNTER — Ambulatory Visit (INDEPENDENT_AMBULATORY_CARE_PROVIDER_SITE_OTHER): Admitting: Family Medicine

## 2024-04-30 ENCOUNTER — Encounter: Payer: Self-pay | Admitting: Family Medicine

## 2024-04-30 VITALS — BP 120/86 | HR 77 | Temp 98.4°F | Ht 63.0 in | Wt 218.5 lb

## 2024-04-30 DIAGNOSIS — R739 Hyperglycemia, unspecified: Secondary | ICD-10-CM | POA: Diagnosis not present

## 2024-04-30 DIAGNOSIS — G935 Compression of brain: Secondary | ICD-10-CM | POA: Diagnosis not present

## 2024-04-30 DIAGNOSIS — E781 Pure hyperglyceridemia: Secondary | ICD-10-CM | POA: Diagnosis not present

## 2024-04-30 DIAGNOSIS — G43109 Migraine with aura, not intractable, without status migrainosus: Secondary | ICD-10-CM | POA: Diagnosis not present

## 2024-04-30 NOTE — Progress Notes (Signed)
 Subjective:    Patient ID: Amy Aguilar, female    DOB: October 28, 1975, 48 y.o.   MRN: [id number] Patient is here today for follow-up.  She has headaches that are chronic in nature.  MRI showed a Chiari malformation.  She states that she is getting 2-3 headaches a month ever since starting the Topamax .  They have improved dramatically on Topamax .  She states that she is able to manage the headaches with Excedrin Migraine.  She is also on fenofibrate  and fish oil for dyslipidemia and hypertriglyceridemia.  She is due to recheck her triglycerides.  She also has a history of elevated blood sugars.  She is due to check a hemoglobin A1c.  Mammogram and Cologuard are up-to-date.  She has a history of a hysterectomy and therefore does not require Pap smear.  Past Medical History:  Diagnosis Date   Abnormal uterine bleeding (AUB)    Anemia    received blood transfusion 2 wks ago   Blood transfusion without reported diagnosis    Headache    migraines   Leiomyoma of uterus    Uterine fibroid    Past Surgical History:  Procedure Laterality Date   ABDOMINAL HYSTERECTOMY     BILATERAL SALPINGECTOMY Bilateral 12/05/2014   Procedure: BILATERAL SALPINGECTOMY;  Surgeon: Charlie CHRISTELLA Croak, MD;  Location: [facility];  Service: Gynecology;  Laterality: Bilateral;   CARPAL TUNNEL RELEASE Right 06/28/2018   Procedure: RIGHT CARPAL TUNNEL RELEASE;  Surgeon: Jerri Kay CHRISTELLA, MD;  Location: [facility];  Service: Orthopedics;  Laterality: Right;   LAPAROSCOPIC ASSISTED VAGINAL HYSTERECTOMY N/A 12/05/2014   Procedure: LAPAROSCOPY, ATTEMPTED VAGINAL HYSTERECTOMY, TOTAL ABDOMINAL HYSTERECTOMY;  Surgeon: Charlie CHRISTELLA Croak, MD;  Location: [facility];  Service: Gynecology;  Laterality: N/A;   NERVE REPAIR Right 03/08/2019   Procedure: EXPLORATION MEDIAN NERVE,  NERVE REPAIR WITH WRAP;  Surgeon: Murrell Drivers, MD;  Location: [facility];  Service:  Orthopedics;  Laterality: Right;   Current Outpatient Medications on File Prior to Visit  Medication Sig Dispense Refill   fenofibrate  (TRICOR ) 145 MG tablet TAKE 1 TABLET BY MOUTH EVERY DAY 30 tablet 1   omega-3 acid ethyl esters (LOVAZA ) 1 g capsule Take 2 capsules (2 g total) by mouth daily. 180 capsule 3   tiZANidine  (ZANAFLEX ) 2 MG tablet Take 1 tablet (2 mg total) by mouth at bedtime as needed for muscle spasms. 30 tablet 3   topiramate  (TOPAMAX ) 50 MG tablet Take 1 tablet (50 mg total) by mouth daily. 90 tablet 1   No current facility-administered medications on file prior to visit.   No Known Allergies Social History   Socioeconomic History   Marital status: Divorced    Spouse name: Not on file   Number of children: 5   Years of education: Not on file   Highest education level: Not on file  Occupational History   Not on file  Tobacco Use   Smoking status: Never   Smokeless tobacco: Never  Vaping Use   Vaping status: Never Used  Substance and Sexual Activity   Alcohol use: No   Drug use: No   Sexual activity: Yes    Birth control/protection: Surgical    Comment: hysterectomy  Other Topics Concern   Not on file  Social History Narrative   ** Merged History Encounter ** Marital status: married x 17 years; currently separated as of 12/2021.  From [place]; moved to [place]  2000.   Children: 5 children; no grandchildren.  Living: husband, 5 children.   Employment: homemaker.   Right handed   Social Drivers of Health   Financial Resource Strain: Medium Risk (01/30/2024)   Overall Financial Resource Strain (CARDIA)    Difficulty of Paying Living Expenses: Somewhat hard  Food Insecurity: Food Insecurity Present (01/30/2024)   Hunger Vital Sign    Worried About Running Out of Food in the Last Year: Often true    Ran Out of Food in the Last Year: Often true  Transportation Needs: No Transportation Needs (01/30/2024)   PRAPARE - Administrator, Civil Service  (Medical): No    Lack of Transportation (Non-Medical): No  Physical Activity: Insufficiently Active (01/30/2024)   Exercise Vital Sign    Days of Exercise per Week: 3 days    Minutes of Exercise per Session: 30 min  Stress: Stress Concern Present (01/30/2024)   [employer] of Occupational Health - Occupational Stress Questionnaire    Feeling of Stress : Rather much  Social Connections: Socially Isolated (01/30/2024)   Social Connection and Isolation Panel    Frequency of Communication with Friends and Family: More than three times a week    Frequency of Social Gatherings with Friends and Family: More than three times a week    Attends Religious Services: Never    Database administrator or Organizations: No    Attends Banker Meetings: Never    Marital Status: Separated  Intimate Partner Violence: Not At Risk (01/30/2024)   Humiliation, Afraid, Rape, and Kick questionnaire    Fear of Current or Ex-Partner: No    Emotionally Abused: No    Physically Abused: No    Sexually Abused: No      Review of Systems  All other systems reviewed and are negative.      Objective:   Physical Exam Constitutional:      Appearance: Normal appearance. She is normal weight.  Cardiovascular:     Rate and Rhythm: Normal rate and regular rhythm.     Heart sounds: Normal heart sounds.  Pulmonary:     Effort: Pulmonary effort is normal.     Breath sounds: Normal breath sounds.  Neurological:     Mental Status: She is alert.           Assessment & Plan:  Chiari I malformation (HCC)  Migraine with aura and without status migrainosus, not intractable  Elevated blood sugar - Plan: Hemoglobin A1c, Lipid panel  Hypertriglyceridemia - Plan: Hemoglobin A1c, CBC with Differential/Platelet, Comprehensive metabolic panel with GFR, Lipid panel We will continue Topamax  50 mg a day as this seems to be helping control her headache.  Mammogram and colon cancer screening test are  up-to-date.  I have asked the patient to return fasting for hemoglobin A1c.  She has a history of mild prediabetes and I want to monitor this.  I recommended a low carbohydrate diet and 30 minutes a day of aerobic exercise.  Also would like the patient to return fasting so that we can get a fasting lipid panel to monitor her triglycerides currently on fenofibrate  and fish oil.  Continue to encourage weight loss and aerobic exercise and a low-carb diet

## 2024-05-01 ENCOUNTER — Telehealth: Payer: Self-pay | Admitting: Family Medicine

## 2024-05-01 ENCOUNTER — Other Ambulatory Visit: Payer: Self-pay

## 2024-05-01 DIAGNOSIS — E7800 Pure hypercholesterolemia, unspecified: Secondary | ICD-10-CM

## 2024-05-01 MED ORDER — FENOFIBRATE 145 MG PO TABS: 145.0000 mg | ORAL_TABLET | Freq: Every day | ORAL | 1 refills | Status: AC

## 2024-05-01 NOTE — Telephone Encounter (Signed)
 Prescription Request  05/01/2024  LOV: 04/30/2024  What is the name of the medication or equipment? fenofibrate  (TRICOR ) 145 MG tablet   Have you contacted your pharmacy to request a refill? Yes   Which pharmacy would you like this sent to?  [id number] [address], [place] - [address] [place] [address] Phone: [phone] Fax: [phone]    Patient notified that their request is being sent to the clinical staff for review and that they should receive a response within 2 business days.   Please advise at [facility] [phone]

## 2024-05-01 NOTE — Telephone Encounter (Signed)
 Sent in medication

## 2024-05-07 ENCOUNTER — Other Ambulatory Visit

## 2024-05-08 ENCOUNTER — Ambulatory Visit: Payer: Self-pay | Admitting: Family Medicine

## 2024-05-08 LAB — CBC WITH DIFFERENTIAL/PLATELET
Absolute Lymphocytes: 2516 {cells}/uL (ref 850–3900)
Absolute Monocytes: 320 {cells}/uL (ref 200–950)
Basophils Absolute: 27 {cells}/uL (ref 0–200)
Basophils Relative: 0.4 %
Eosinophils Absolute: 109 {cells}/uL (ref 15–500)
Eosinophils Relative: 1.6 %
HCT: 40 % (ref 35.0–45.0)
Hemoglobin: 12.6 g/dL (ref 11.7–15.5)
MCH: 28.1 pg (ref 27.0–33.0)
MCHC: 31.5 g/dL — ABNORMAL LOW (ref 32.0–36.0)
MCV: 89.3 fL (ref 80.0–100.0)
MPV: 11.4 fL (ref 7.5–12.5)
Monocytes Relative: 4.7 %
Neutro Abs: 3828 {cells}/uL (ref 1500–7800)
Neutrophils Relative %: 56.3 %
Platelets: 232 Thousand/uL (ref 140–400)
RBC: 4.48 Million/uL (ref 3.80–5.10)
RDW: 13 % (ref 11.0–15.0)
Total Lymphocyte: 37 %
WBC: 6.8 Thousand/uL (ref 3.8–10.8)

## 2024-05-08 LAB — LIPID PANEL
Cholesterol: 174 mg/dL (ref <200)
HDL: 40 mg/dL — ABNORMAL LOW (ref >=50)
LDL Cholesterol (Calc): 104 mg/dL — ABNORMAL HIGH
Non-HDL Cholesterol (Calc): 134 mg/dL — ABNORMAL HIGH (ref <130)
Total CHOL/HDL Ratio: 4.4 (calc) (ref <5.0)
Triglycerides: 180 mg/dL — ABNORMAL HIGH (ref <150)

## 2024-05-08 LAB — COMPREHENSIVE METABOLIC PANEL WITH GFR
AG Ratio: 1.7 (calc) (ref 1.0–2.5)
ALT: 31 U/L — ABNORMAL HIGH (ref 6–29)
AST: 21 U/L (ref 10–35)
Albumin: 4.3 g/dL (ref 3.6–5.1)
Alkaline phosphatase (APISO): 107 U/L (ref 31–125)
BUN: 15 mg/dL (ref 7–25)
CO2: 25 mmol/L (ref 20–32)
Calcium: 8.8 mg/dL (ref 8.6–10.2)
Chloride: 108 mmol/L (ref 98–110)
Creat: 0.56 mg/dL (ref 0.50–0.99)
Globulin: 2.5 g/dL (ref 1.9–3.7)
Glucose, Bld: 109 mg/dL — ABNORMAL HIGH (ref 65–99)
Potassium: 4.1 mmol/L (ref 3.5–5.3)
Sodium: 140 mmol/L (ref 135–146)
Total Bilirubin: 0.5 mg/dL (ref 0.2–1.2)
Total Protein: 6.8 g/dL (ref 6.1–8.1)
eGFR: 114 mL/min/1.73m2 (ref >=60)

## 2024-05-08 LAB — HEMOGLOBIN A1C
Hgb A1c MFr Bld: 5.9 % — ABNORMAL HIGH (ref <5.7)
Mean Plasma Glucose: 123 mg/dL
eAG (mmol/L): 6.8 mmol/L

## 2024-07-16 ENCOUNTER — Ambulatory Visit (INDEPENDENT_AMBULATORY_CARE_PROVIDER_SITE_OTHER): Admitting: Neurology

## 2024-07-16 VITALS — BP 129/84 | HR 80 | Ht 62.0 in | Wt 219.0 lb

## 2024-07-16 DIAGNOSIS — G43709 Chronic migraine without aura, not intractable, without status migrainosus: Secondary | ICD-10-CM | POA: Diagnosis not present

## 2024-07-16 MED ORDER — TOPIRAMATE 25 MG PO TABS: 25.0000 mg | ORAL_TABLET | Freq: Every day | ORAL | 3 refills | Status: AC

## 2024-07-16 NOTE — Progress Notes (Signed)
 Follow-up Visit   Date: 07/16/2024    Amy Aguilar MRN: [id number] DOB: [birth year]    Amy Aguilar is a 48 y.o. 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  The patient was accompanied to the clinic by self.  IMPRESSION/PLAN: Chronic migraine headaches, improved on topiramate  50mg  daily.  She self-tapered down to topirmate 25mg  daily, which will be continued.   Cervicalgia.  No evidence of nerve impingement on MRI cervical spine.  Improved.    - Previously tried:  flexeril  (intolerant), tizanidine  (worsening headaches), no benefit with PT  Chiari 1 malformation, mild.  Asymptomatic.  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 Eye exam is normal  Return to clinic in 6 months  --------------------------------------------- History of present illness: She complains of headache involving the base of the neck, which started to radiate down her right arm.   She has a constant pressure.  Headaches occur 2-3 times per week, which lasts 2-days.  She has tried excedrin which helps her pain but gives her GI upset.  No benefit with tylenol .  She has tried physical therapy twice which has not helped.  She had MRI cervical spine which noted Chiari 1 malformation and empty sella, no evidence of stenosis or anything to explain her arm pain.   She reports having history of 4 surgeries on her right wrist due to carpal tunnel syndrome and complicated related to that.  She is followed at [facility] for her right hand.    UPDATE 08/12/2023:  She was started on topiramate  25mg  at her last visit, but she has not noticed any benefit.  She continues to get headaches 2-3 times per week and lasts 1-2 days.    UPDATE 01/09/2024:  She is here for follow-up visit.  Topiramate  was increased to 50mg  daily at her last visit.  Her headaches are much better and occur about once per month.  She continues to  have left sided neck and back pain.  No improvement with PT.   UPDATE 07/16/2024:  She is here for follow-up visit.  She reports that migraines are under good control with last migraine about 2 months ago.  She was taking topiramate  50mg , but for for the past month, she has reduced topiramate  25mg  daily.   Neck pain is better.   Medications:  Current Outpatient Medications on File Prior to Visit  Medication Sig Dispense Refill   fenofibrate  (TRICOR ) 145 MG tablet Take 1 tablet (145 mg total) by mouth daily. 30 tablet 1   omega-3 acid ethyl esters (LOVAZA ) 1 g capsule Take 2 capsules (2 g total) by mouth daily. 180 capsule 3   topiramate  (TOPAMAX ) 50 MG tablet Take 1 tablet (50 mg total) by mouth daily. (Patient taking differently: Take 25 mg by mouth daily.) 90 tablet 1   tiZANidine  (ZANAFLEX ) 2 MG tablet Take 1 tablet (2 mg total) by mouth at bedtime as needed for muscle spasms. (Patient not taking: Reported on 07/16/2024) 30 tablet 3   No current facility-administered medications on file prior to visit.    Allergies: No Known Allergies  Vital Signs:  BP 129/84   Pulse 80   Ht 5' 2 (1.575 m)   Wt 219 lb (99.3 kg)   LMP 11/21/2014   SpO2 98%   BMI 40.06 kg/m     Neurological Exam: MENTAL STATUS including orientation to time, place, person, recent and remote memory, attention span and concentration, language, and fund  of knowledge is normal.  Speech is not dysarthric.  CRANIAL NERVES:    Pupils equal round and reactive to light.  Normal conjugate, extra-ocular eye movements in all directions of gaze.  No ptosis.  Face is symmetric.   MOTOR:  Motor strength is 5/5 in all extremities, including right arm.  No atrophy, fasciculations or abnormal movements.  No pronator drift.  Tone is normal.    COORDINATION/GAIT:   Gait narrow based and stable.   Data: MRI cervical spine wo contrast 05/29/2023: 1. Mild Chiari 1 malformation with the cerebellar tonsils extending up to 8 mm below the  foramen magnum. 2. Empty sella. While this finding is often incidental in nature and of no clinical significance, this can also be seen in the setting of idiopathic intracranial hypertension. 3. Otherwise unremarkable and normal MRI of the cervical spine. No significant disc pathology, stenosis, or evidence for neural impingement.   MRI brain wo contrast 02/22/2018:  Normal brain.   Thank you for allowing me to participate in patient's care.  If I can answer any additional questions, I would be pleased to do so.    Sincerely,    Kaceton Vieau K. Tobie, DO

## 2024-08-09 ENCOUNTER — Ambulatory Visit: Admitting: Family Medicine

## 2024-08-10 ENCOUNTER — Ambulatory Visit: Admitting: Family Medicine

## 2024-08-10 ENCOUNTER — Encounter: Payer: Self-pay | Admitting: Family Medicine

## 2024-08-10 VITALS — BP 125/87 | HR 79 | Temp 97.6°F | Ht 62.0 in | Wt 216.2 lb

## 2024-08-10 DIAGNOSIS — N6001 Solitary cyst of right breast: Secondary | ICD-10-CM | POA: Diagnosis not present

## 2024-08-10 NOTE — Progress Notes (Signed)
 Subjective:    Patient ID: Amy Aguilar, female    DOB: 06-13-1976, 48 y.o.   MRN: [id number] Patient is concerned because she has noticed a lump in her right breast.  It is not painful.  With a chaperone present, I examined her breast.  Approximately 3 inches from her nipple and about 1 or 2:00 there is a 4 to 5 mm cystlike lesion in the superficial skin.  It is not tender it is not painful and it is freely mobile.  I looked back through her records, the patient had an ultrasound of her right breast in May.  They felt the exact same lesion 7 cm from the nipple at 1:00.  They found to be a benign cyst.  Please see ultrasound report from May   Past Medical History:  Diagnosis Date   Abnormal uterine bleeding (AUB)    Anemia    received blood transfusion 2 wks ago   Blood transfusion without reported diagnosis    Headache    migraines   Leiomyoma of uterus    Uterine fibroid    Past Surgical History:  Procedure Laterality Date   ABDOMINAL HYSTERECTOMY     BILATERAL SALPINGECTOMY Bilateral 12/05/2014   Procedure: BILATERAL SALPINGECTOMY;  Surgeon: Charlie CHRISTELLA Croak, MD;  Location: [facility];  Service: Gynecology;  Laterality: Bilateral;   CARPAL TUNNEL RELEASE Right 06/28/2018   Procedure: RIGHT CARPAL TUNNEL RELEASE;  Surgeon: Jerri Kay CHRISTELLA, MD;  Location: [facility];  Service: Orthopedics;  Laterality: Right;   LAPAROSCOPIC ASSISTED VAGINAL HYSTERECTOMY N/A 12/05/2014   Procedure: LAPAROSCOPY, ATTEMPTED VAGINAL HYSTERECTOMY, TOTAL ABDOMINAL HYSTERECTOMY;  Surgeon: Charlie CHRISTELLA Croak, MD;  Location: [facility];  Service: Gynecology;  Laterality: N/A;   NERVE REPAIR Right 03/08/2019   Procedure: EXPLORATION MEDIAN NERVE,  NERVE REPAIR WITH WRAP;  Surgeon: Murrell Drivers, MD;  Location: [facility];  Service: Orthopedics;  Laterality: Right;   Current Outpatient Medications on File Prior to Visit  Medication Sig  Dispense Refill   omega-3 acid ethyl esters (LOVAZA ) 1 g capsule Take 2 capsules (2 g total) by mouth daily. 180 capsule 3   topiramate  (TOPAMAX ) 25 MG tablet Take 1 tablet (25 mg total) by mouth daily. 90 tablet 3   fenofibrate  (TRICOR ) 145 MG tablet Take 1 tablet (145 mg total) by mouth daily. (Patient not taking: Reported on 08/10/2024) 30 tablet 1   tiZANidine  (ZANAFLEX ) 2 MG tablet Take 1 tablet (2 mg total) by mouth at bedtime as needed for muscle spasms. (Patient not taking: Reported on 08/10/2024) 30 tablet 3   No current facility-administered medications on file prior to visit.   No Known Allergies Social History   Socioeconomic History   Marital status: Divorced    Spouse name: Not on file   Number of children: 5   Years of education: Not on file   Highest education level: Not on file  Occupational History   Not on file  Tobacco Use   Smoking status: Never   Smokeless tobacco: Never  Vaping Use   Vaping status: Never Used  Substance and Sexual Activity   Alcohol use: No   Drug use: No   Sexual activity: Yes    Birth control/protection: Surgical    Comment: hysterectomy  Other Topics Concern   Not on file  Social History Narrative   ** Merged History Encounter ** Marital status: married x 17 years; currently separated as of 12/2021.  From [place]; moved to  [place]  2000.   Children: 5 children; no grandchildren.    Living: husband, 5 children.   Employment: homemaker.   Right handed   Social Drivers of Health   Financial Resource Strain: Medium Risk (01/30/2024)   Overall Financial Resource Strain (CARDIA)    Difficulty of Paying Living Expenses: Somewhat hard  Food Insecurity: Food Insecurity Present (01/30/2024)   Hunger Vital Sign    Worried About Running Out of Food in the Last Year: Often true    Ran Out of Food in the Last Year: Often true  Transportation Needs: No Transportation Needs (01/30/2024)   PRAPARE - Administrator, Civil Service (Medical): No     Lack of Transportation (Non-Medical): No  Physical Activity: Insufficiently Active (01/30/2024)   Exercise Vital Sign    Days of Exercise per Week: 3 days    Minutes of Exercise per Session: 30 min  Stress: Stress Concern Present (01/30/2024)   [employer] of Occupational Health - Occupational Stress Questionnaire    Feeling of Stress : Rather much  Social Connections: Socially Isolated (01/30/2024)   Social Connection and Isolation Panel    Frequency of Communication with Friends and Family: More than three times a week    Frequency of Social Gatherings with Friends and Family: More than three times a week    Attends Religious Services: Never    Database administrator or Organizations: No    Attends Banker Meetings: Never    Marital Status: Separated  Intimate Partner Violence: Not At Risk (01/30/2024)   Humiliation, Afraid, Rape, and Kick questionnaire    Fear of Current or Ex-Partner: No    Emotionally Abused: No    Physically Abused: No    Sexually Abused: No      Review of Systems  All other systems reviewed and are negative.      Objective:   Physical Exam Constitutional:      Appearance: Normal appearance. She is normal weight.  Cardiovascular:     Rate and Rhythm: Normal rate and regular rhythm.     Heart sounds: Normal heart sounds.  Pulmonary:     Effort: Pulmonary effort is normal.     Breath sounds: Normal breath sounds.  Chest:    Neurological:     Mental Status: She is alert.           Assessment & Plan:  Cyst of right breast Reassured the patient that this is a benign skin lesion.  It does not represent breast cancer.  They have already imaged this with an ultrasound of her breast a.  She was unaware of this.  Answered all of her questions.  Follow-up annually with her mammogram

## 2024-09-21 ENCOUNTER — Encounter: Payer: Self-pay | Admitting: Family Medicine

## 2024-11-26 ENCOUNTER — Ambulatory Visit: Admitting: Family Medicine

## 2024-11-28 ENCOUNTER — Ambulatory Visit: Admitting: Family Medicine

## 2024-12-24 ENCOUNTER — Ambulatory Visit: Admitting: Family Medicine

## 2025-01-14 ENCOUNTER — Ambulatory Visit: Admitting: Neurology
